# Patient Record
Sex: Male | Born: 1975 | Race: Black or African American | Hispanic: No | Marital: Single | Smoking: Current every day smoker
Health system: Southern US, Community
[De-identification: ages and names within clinical notes are randomized; demographics above are authoritative.]

## PROBLEM LIST (undated history)

## (undated) DIAGNOSIS — Z21 Asymptomatic human immunodeficiency virus [HIV] infection status: Secondary | ICD-10-CM

## (undated) DIAGNOSIS — F609 Personality disorder, unspecified: Secondary | ICD-10-CM

## (undated) DIAGNOSIS — F411 Generalized anxiety disorder: Secondary | ICD-10-CM

## (undated) DIAGNOSIS — F32A Depression, unspecified: Secondary | ICD-10-CM

## (undated) DIAGNOSIS — F39 Unspecified mood [affective] disorder: Secondary | ICD-10-CM

## (undated) DIAGNOSIS — F250 Schizoaffective disorder, bipolar type: Secondary | ICD-10-CM

## (undated) DIAGNOSIS — F329 Major depressive disorder, single episode, unspecified: Secondary | ICD-10-CM

## (undated) DIAGNOSIS — F200 Paranoid schizophrenia: Secondary | ICD-10-CM

## (undated) DIAGNOSIS — E119 Type 2 diabetes mellitus without complications: Secondary | ICD-10-CM

## (undated) DIAGNOSIS — F410 Panic disorder [episodic paroxysmal anxiety] without agoraphobia: Secondary | ICD-10-CM

## (undated) DIAGNOSIS — I10 Essential (primary) hypertension: Secondary | ICD-10-CM

## (undated) DIAGNOSIS — B20 Human immunodeficiency virus [HIV] disease: Secondary | ICD-10-CM

## (undated) HISTORY — DX: Panic disorder (episodic paroxysmal anxiety): F41.0

## (undated) HISTORY — DX: Essential (primary) hypertension: I10

## (undated) HISTORY — DX: Asymptomatic human immunodeficiency virus (hiv) infection status: Z21

## (undated) HISTORY — PX: SKIN GRAFT: SHX250

## (undated) HISTORY — DX: Human immunodeficiency virus (HIV) disease: B20

---

## 2007-10-09 ENCOUNTER — Emergency Department (HOSPITAL_COMMUNITY): Admission: EM | Admit: 2007-10-09 | Discharge: 2007-10-09 | Payer: Self-pay | Admitting: Emergency Medicine

## 2007-10-15 ENCOUNTER — Emergency Department (HOSPITAL_COMMUNITY): Admission: EM | Admit: 2007-10-15 | Discharge: 2007-10-15 | Payer: Self-pay | Admitting: Emergency Medicine

## 2007-10-18 ENCOUNTER — Emergency Department (HOSPITAL_COMMUNITY): Admission: EM | Admit: 2007-10-18 | Discharge: 2007-10-18 | Payer: Self-pay | Admitting: Emergency Medicine

## 2008-06-02 ENCOUNTER — Emergency Department (HOSPITAL_BASED_OUTPATIENT_CLINIC_OR_DEPARTMENT_OTHER): Admission: EM | Admit: 2008-06-02 | Discharge: 2008-06-02 | Payer: Self-pay | Admitting: Emergency Medicine

## 2008-06-02 ENCOUNTER — Ambulatory Visit: Payer: Self-pay | Admitting: Diagnostic Radiology

## 2008-06-02 IMAGING — CR DG CHEST 2V
2 series · 2 of 2 positions shown · non-contrast
Comparison: None

CLINICAL DATA: Chest tightness, short of breath.

CHEST - 2 VIEW

[w chest pa]
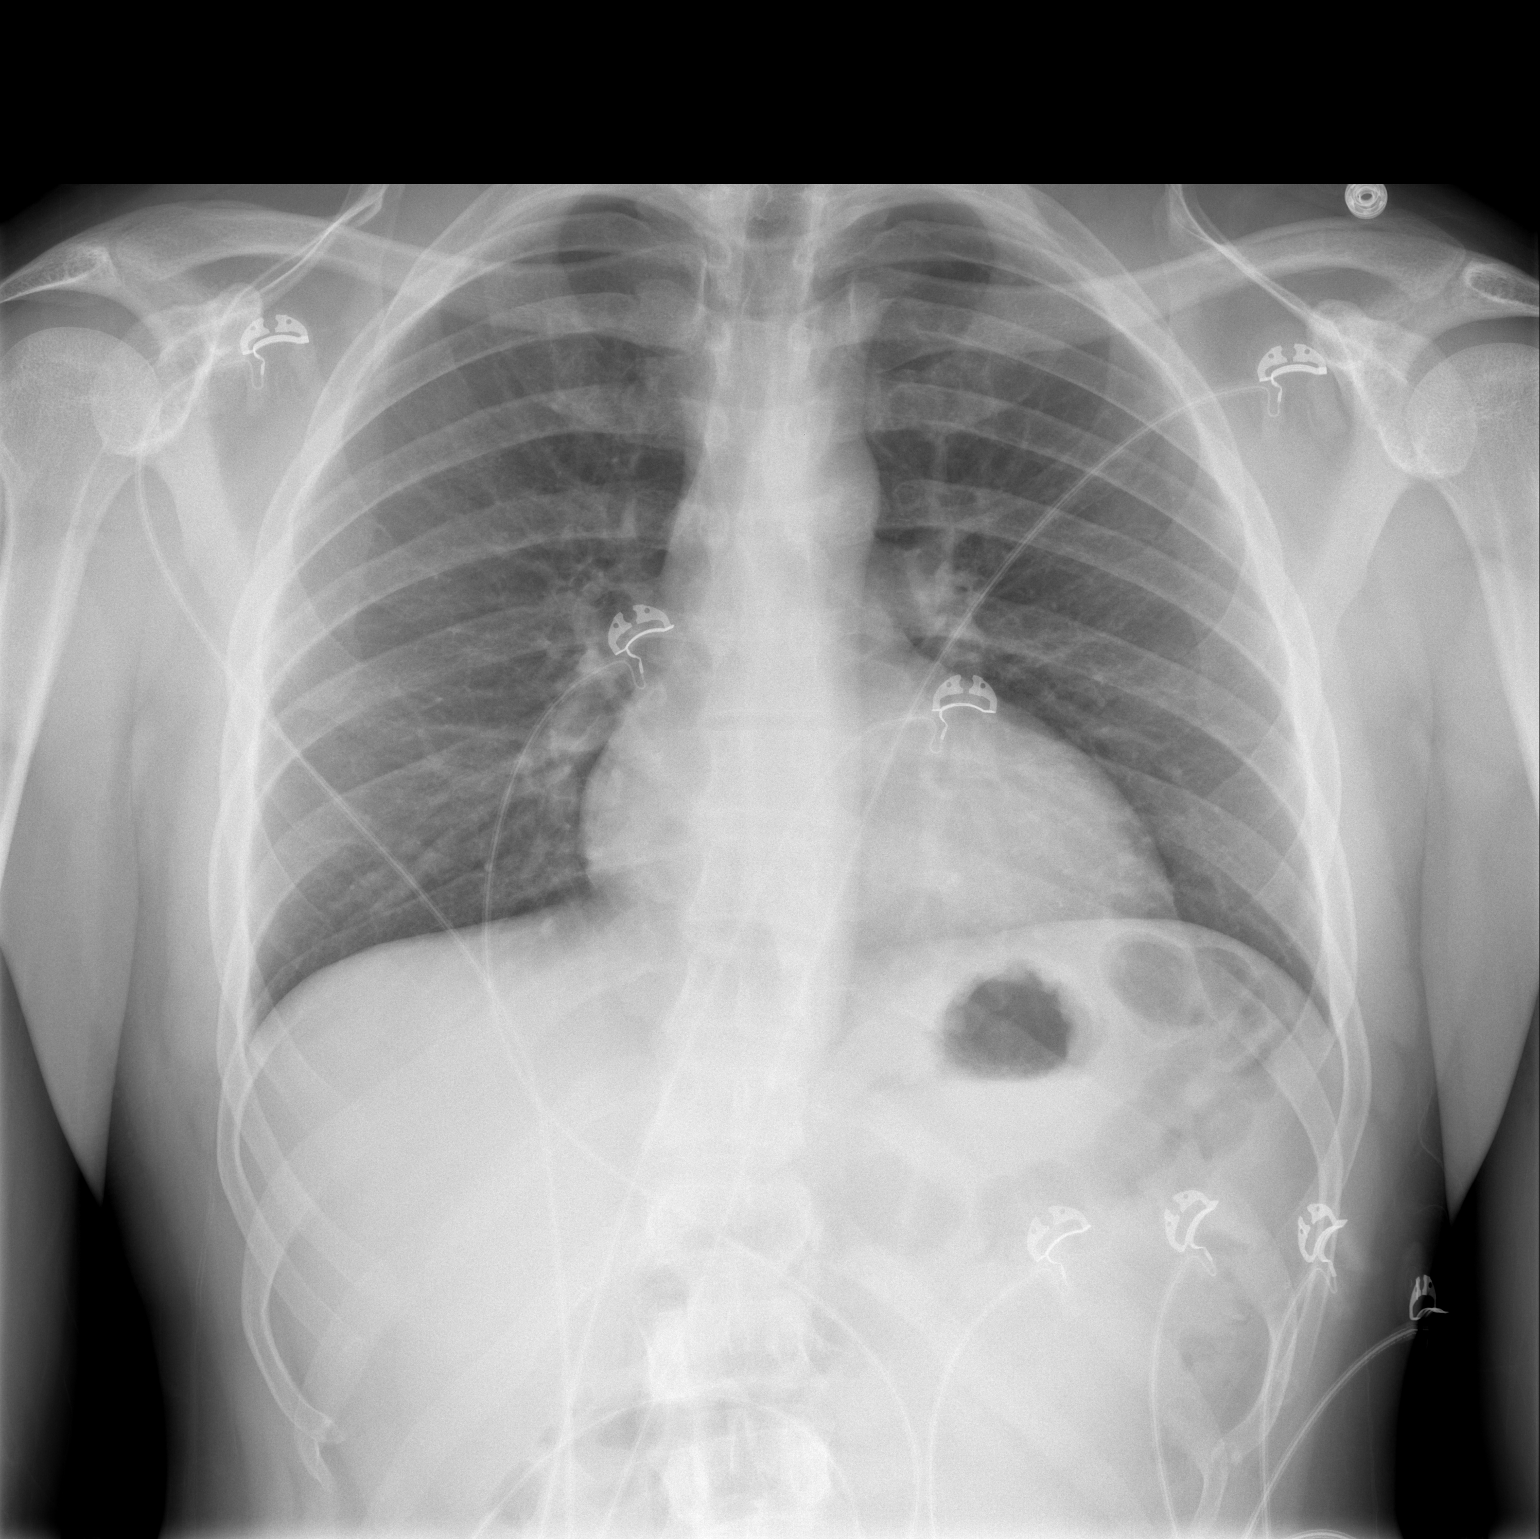

[w chest lat]
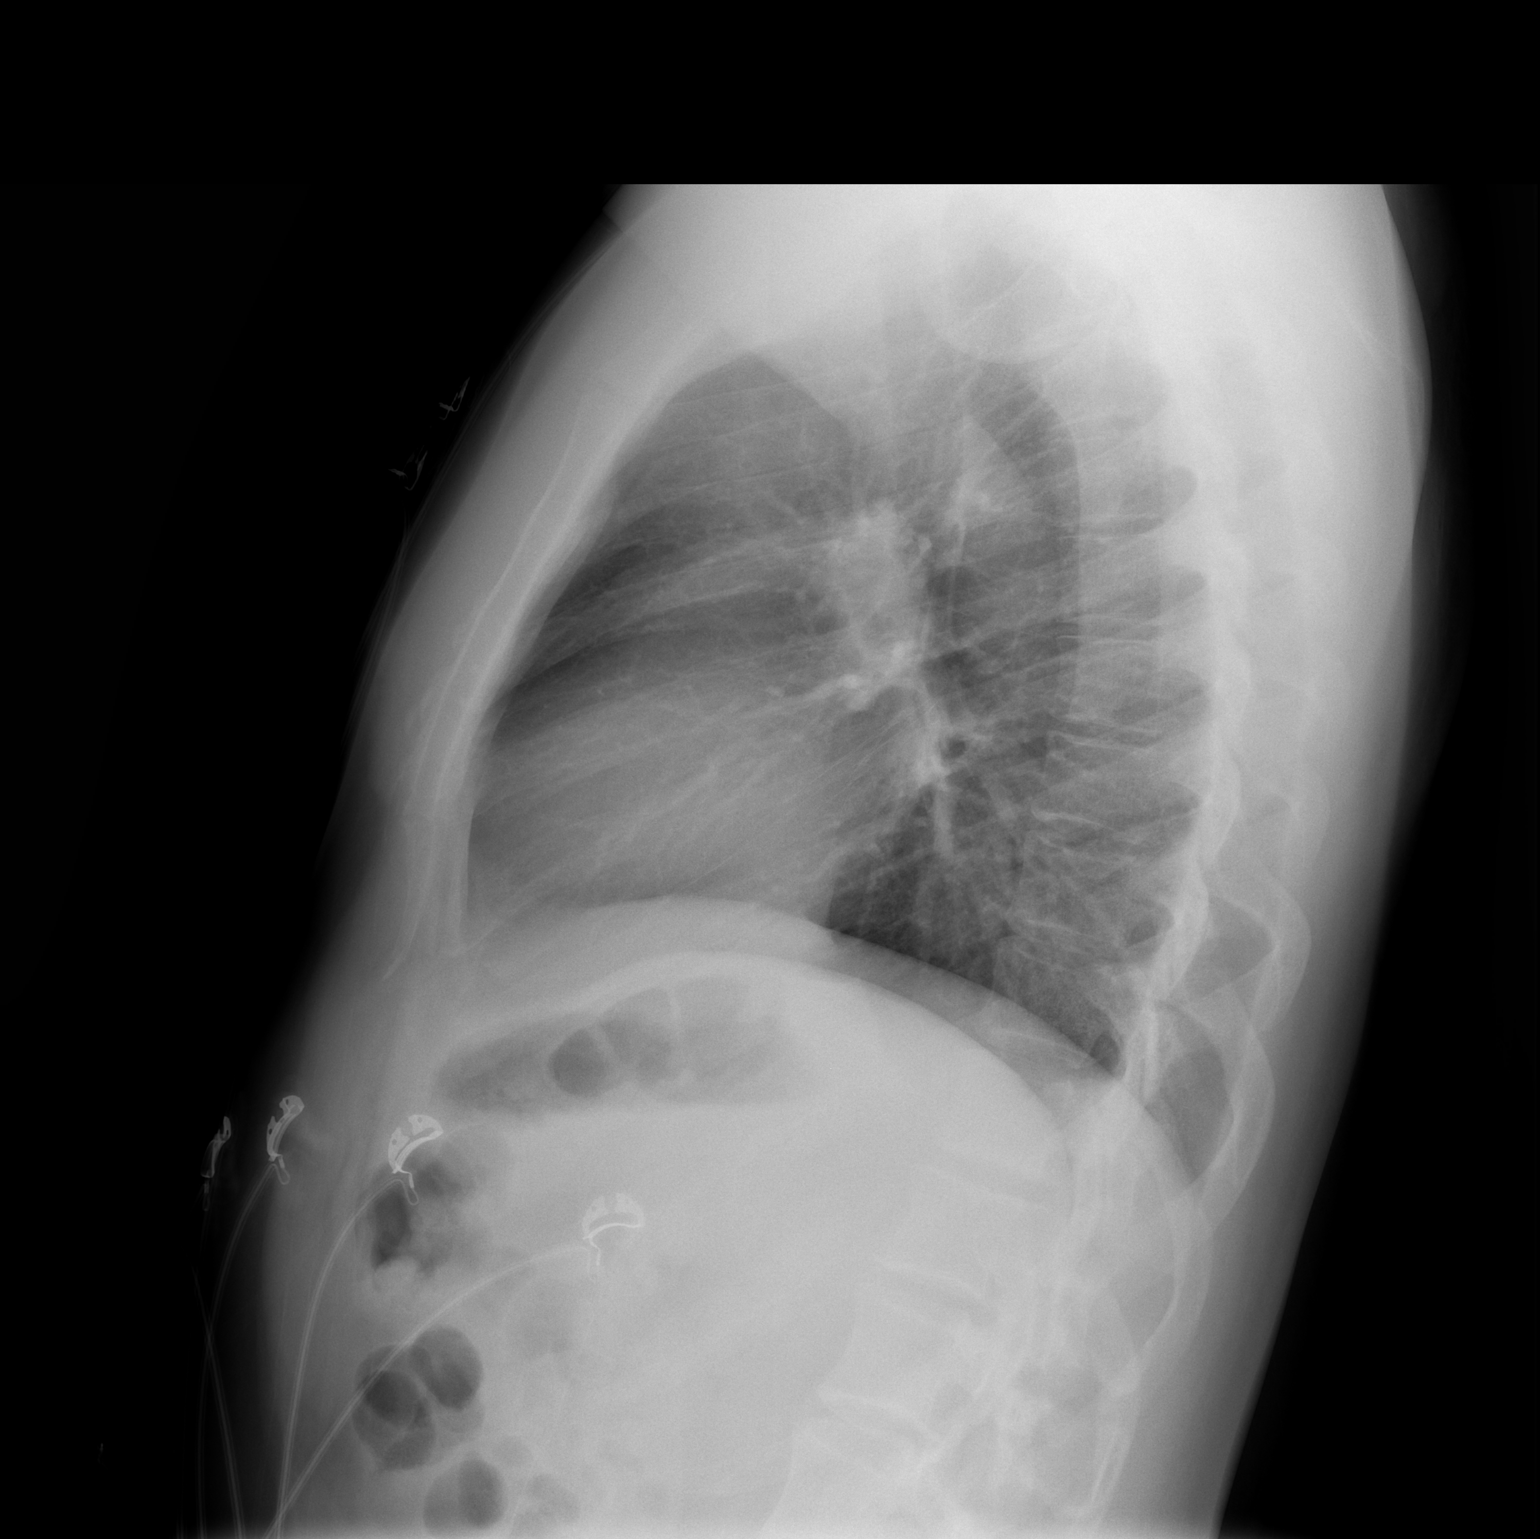

[2 of 2 positions shown; findings below may reference images not displayed]

FINDINGS: Heart size is upper normal.  There is no heart failure
and the lungs are clear without infiltrate or effusion.
IMPRESSION: No active cardiopulmonary disease.

## 2008-09-09 ENCOUNTER — Ambulatory Visit: Payer: Self-pay | Admitting: Interventional Radiology

## 2008-09-09 ENCOUNTER — Emergency Department (HOSPITAL_BASED_OUTPATIENT_CLINIC_OR_DEPARTMENT_OTHER): Admission: EM | Admit: 2008-09-09 | Discharge: 2008-09-09 | Payer: Self-pay | Admitting: Emergency Medicine

## 2008-09-09 IMAGING — CT CT HEAD W/O CM
1 series · 16 of 30 positions shown, 20 images · non-contrast
Comparison: None

CLINICAL DATA: Syncope

CT HEAD WITHOUT CONTRAST
TECHNIQUE: Contiguous axial images were obtained from the base of
the skull through the vertex without contrast.

[Series 2: head 4.8 h37s · axial · 0.46mm/px · z∈[-124,+28]mm · 16 of 36 slices shown, 20 images]
[im 2/36  brain]
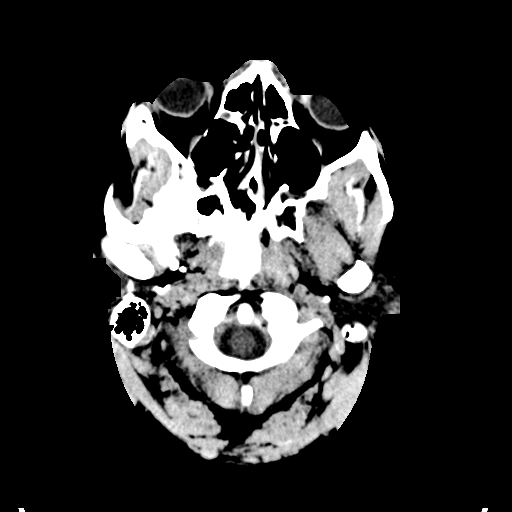
[im 2/36  bone]
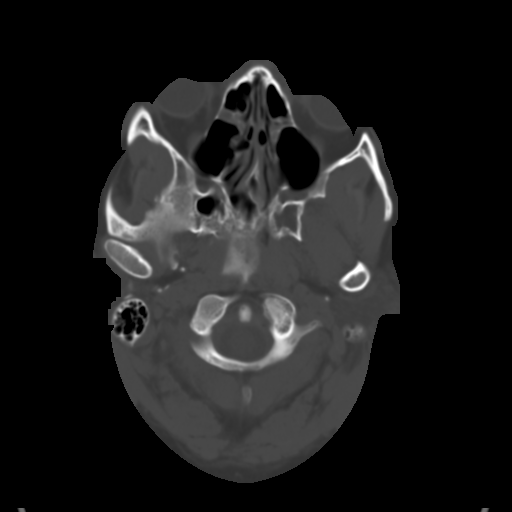
[im 4/36  brain]
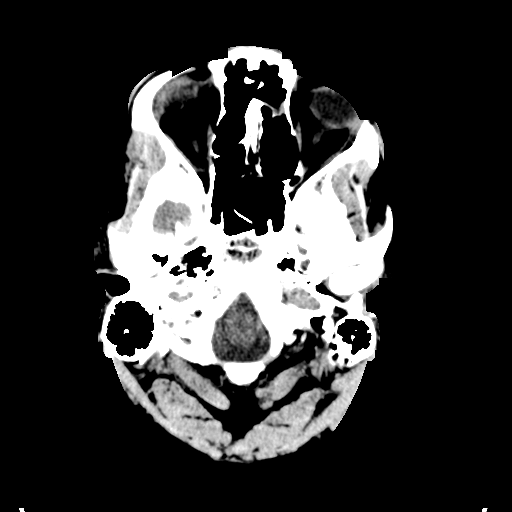
[im 7/36  brain]
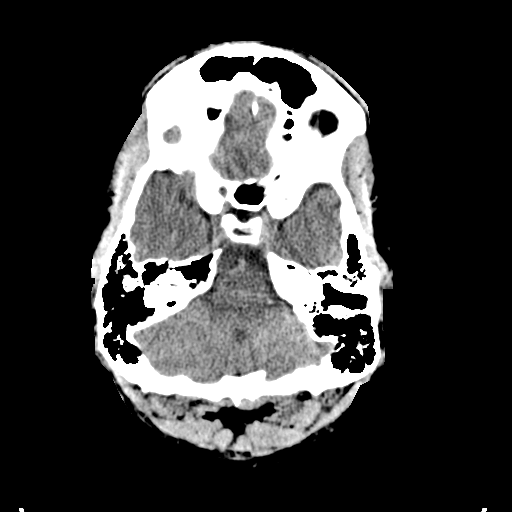
[im 9/36  brain]
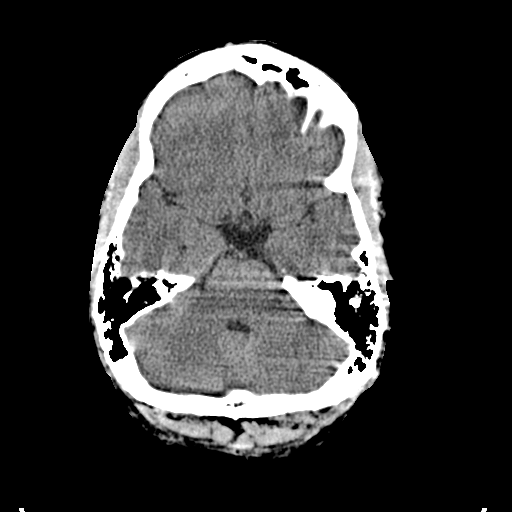
[im 10/36  brain]
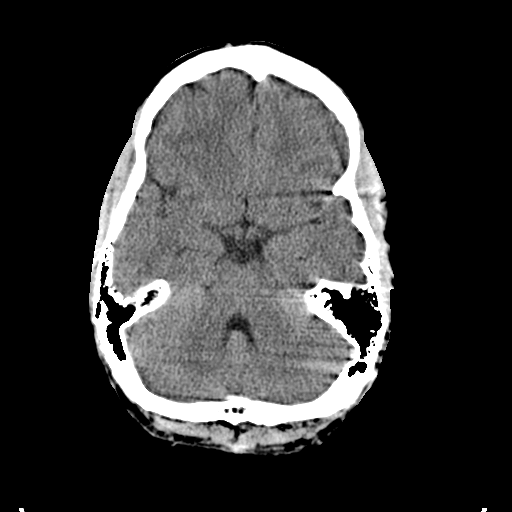
[im 10/36  bone]
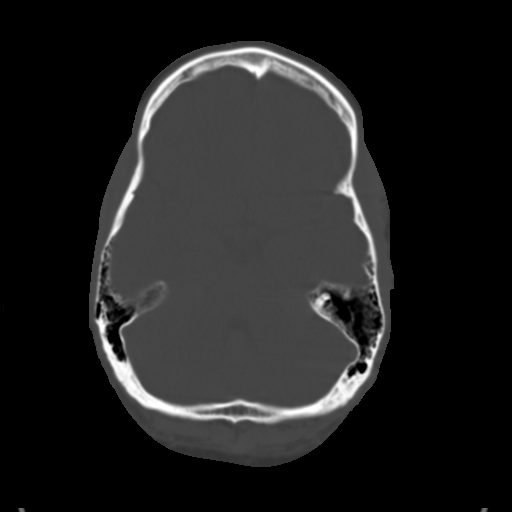
[im 13/36  brain]
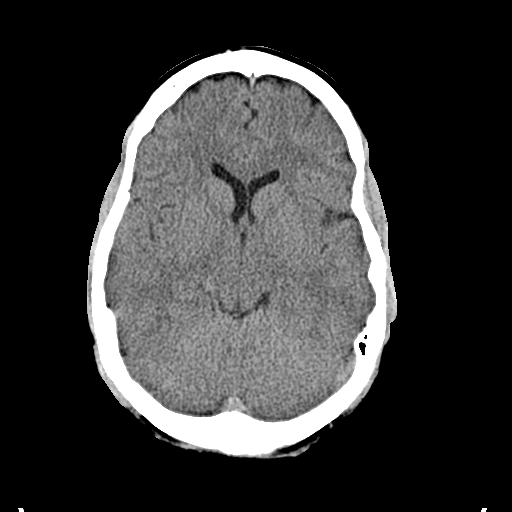
[im 15/36  brain]
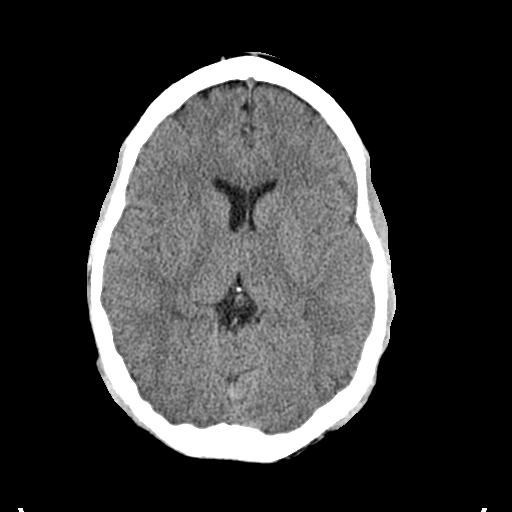
[im 17/36  brain]
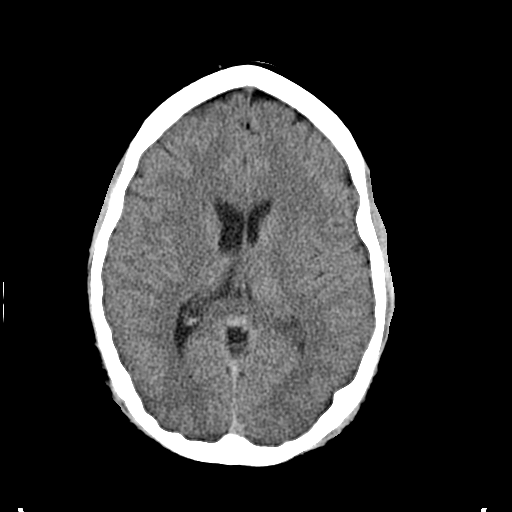
[im 19/36  brain]
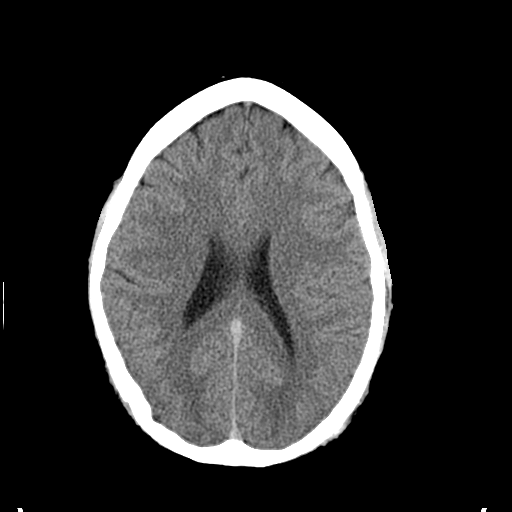
[im 19/36  bone]
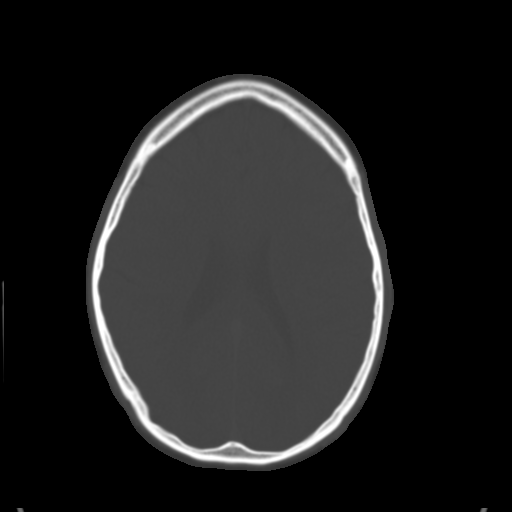
[im 21/36  brain]
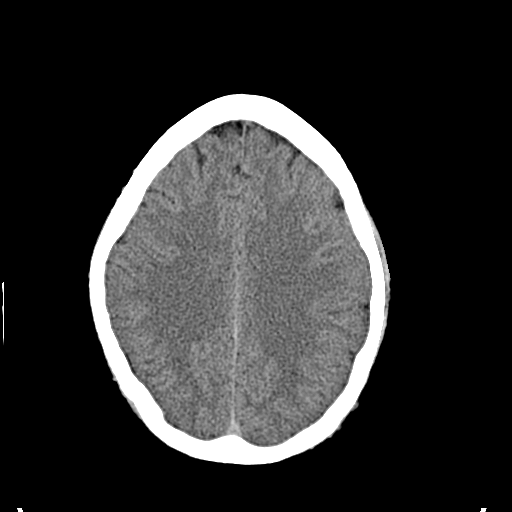
[im 23/36  brain]
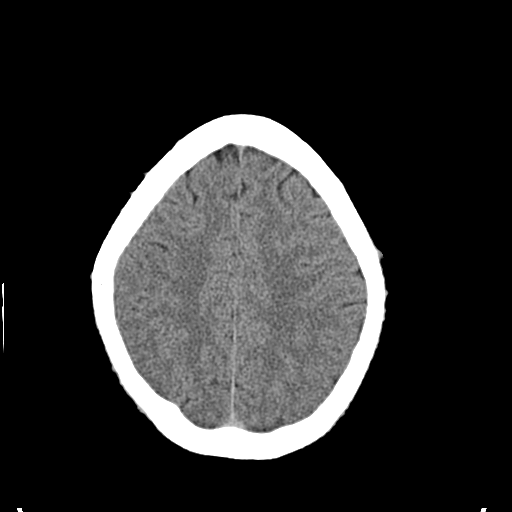
[im 26/36  brain]
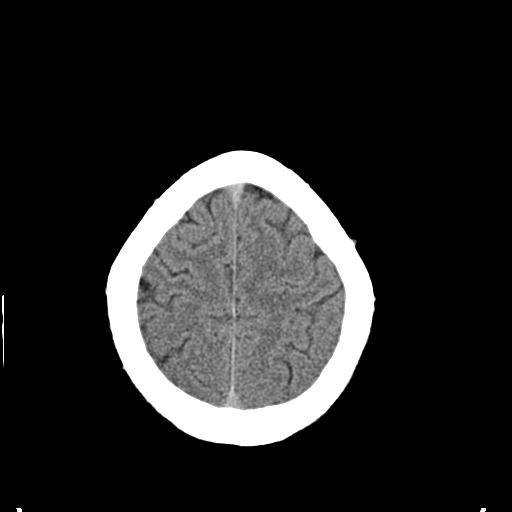
[im 27/36  brain]
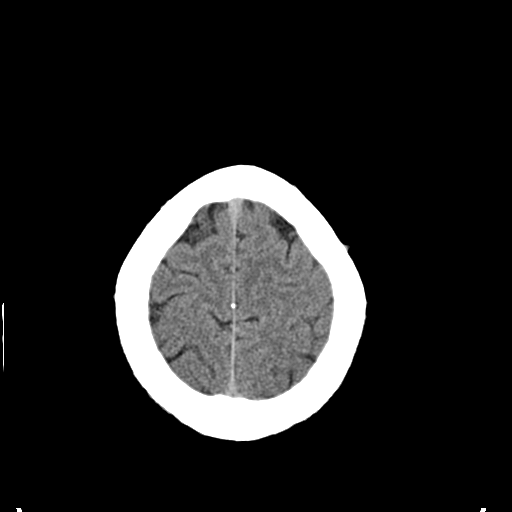
[im 27/36  bone]
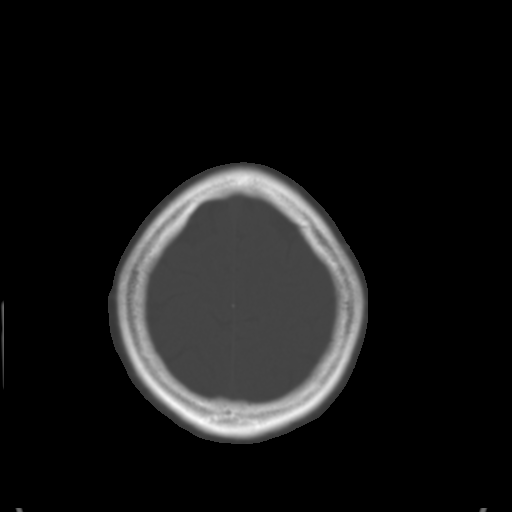
[im 29/36  brain]
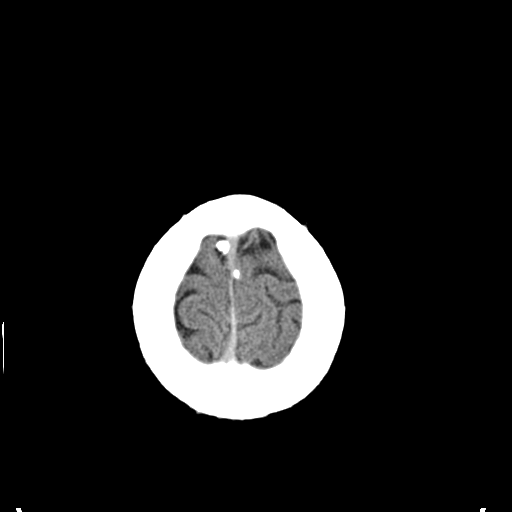
[im 32/36  brain]
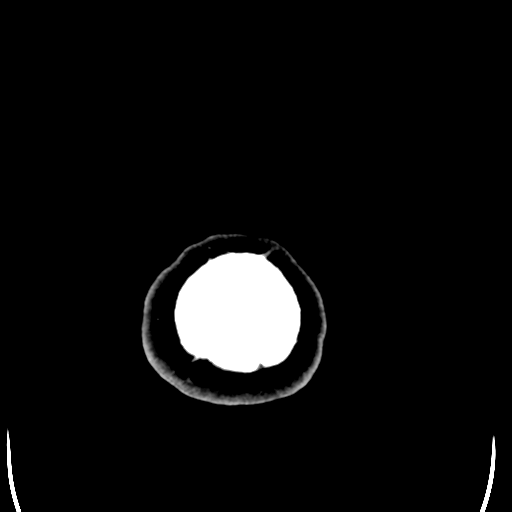
[im 34/36  brain]
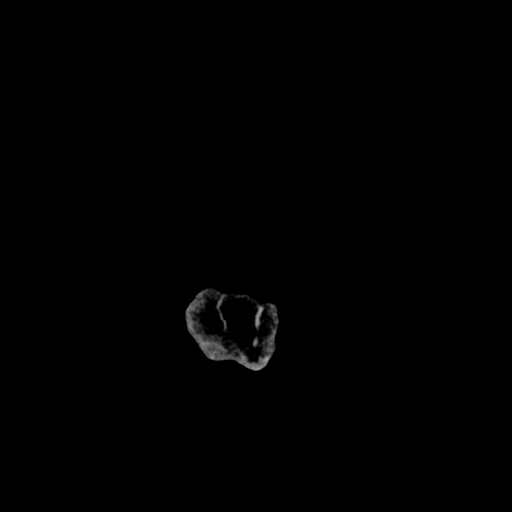

[16 of 30 positions shown; findings below may reference images not displayed]

FINDINGS: There is no mass effect, midline shift, or acute
hemorrhage.  Mastoid air cells are clear.  Cranium is intact.
Partial opacification of the ethmoid air cells is present
bilaterally.
IMPRESSION: Inflammatory changes in the ethmoid air cells are present.
Otherwise no acute intracranial pathology.

## 2008-09-10 ENCOUNTER — Inpatient Hospital Stay (HOSPITAL_COMMUNITY): Admission: EM | Admit: 2008-09-10 | Discharge: 2008-09-16 | Payer: Self-pay | Admitting: Psychiatry

## 2008-09-10 ENCOUNTER — Ambulatory Visit: Payer: Self-pay | Admitting: Psychiatry

## 2008-09-23 ENCOUNTER — Emergency Department (HOSPITAL_COMMUNITY): Admission: EM | Admit: 2008-09-23 | Discharge: 2008-09-23 | Payer: Self-pay | Admitting: Emergency Medicine

## 2008-09-24 ENCOUNTER — Inpatient Hospital Stay (HOSPITAL_COMMUNITY): Admission: AD | Admit: 2008-09-24 | Discharge: 2008-10-04 | Payer: Self-pay | Admitting: *Deleted

## 2008-09-24 ENCOUNTER — Emergency Department (HOSPITAL_BASED_OUTPATIENT_CLINIC_OR_DEPARTMENT_OTHER): Admission: EM | Admit: 2008-09-24 | Discharge: 2008-09-24 | Payer: Self-pay | Admitting: Emergency Medicine

## 2008-10-05 ENCOUNTER — Emergency Department (HOSPITAL_BASED_OUTPATIENT_CLINIC_OR_DEPARTMENT_OTHER): Admission: EM | Admit: 2008-10-05 | Discharge: 2008-10-05 | Payer: Self-pay | Admitting: Emergency Medicine

## 2008-10-05 ENCOUNTER — Ambulatory Visit: Payer: Self-pay | Admitting: Radiology

## 2008-10-05 IMAGING — CT CT HEAD W/O CM
1 series · 16 of 30 positions shown, 20 images · non-contrast
Comparison: [DATE] CT

CLINICAL DATA: Headache

CT HEAD WITHOUT CONTRAST
TECHNIQUE: Contiguous axial images were obtained from the base of
the skull through the vertex without contrast

[Series 2: head 4.8 h37s · axial · 0.47mm/px · z∈[-143,+13]mm · 16 of 36 slices shown, 20 images]
[im 2/36  brain]
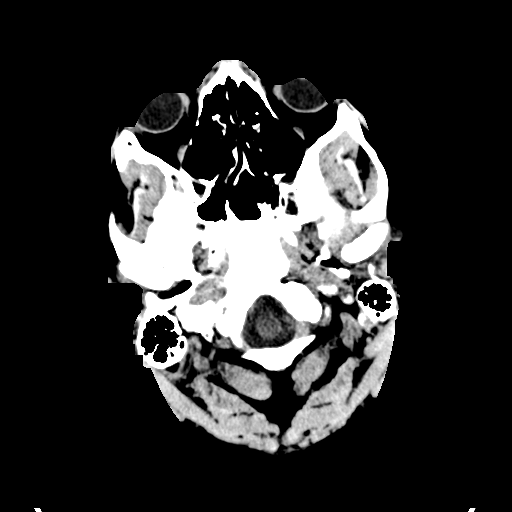
[im 2/36  bone]
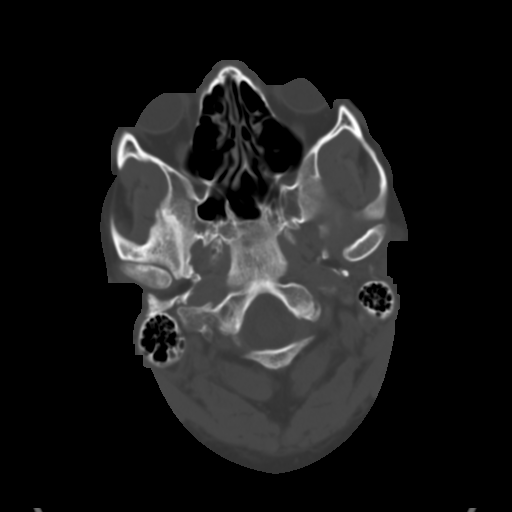
[im 4/36  brain]
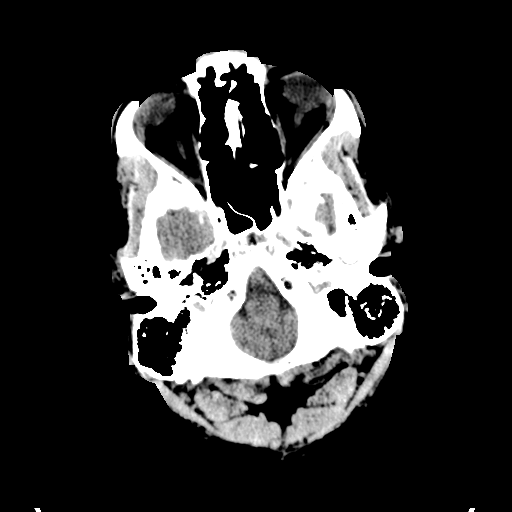
[im 7/36  brain]
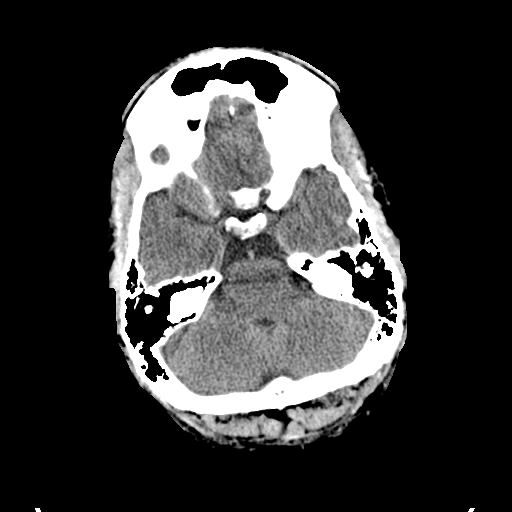
[im 9/36  brain]
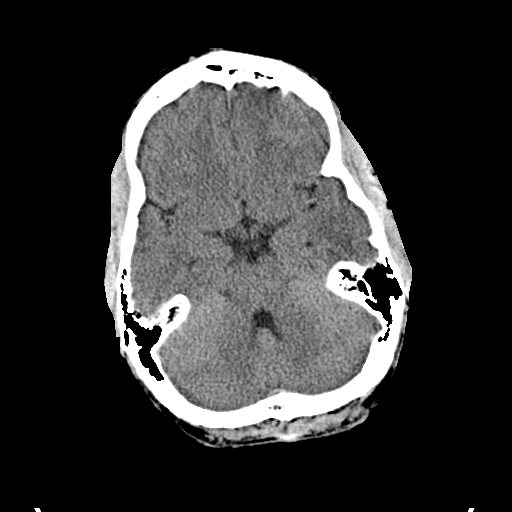
[im 10/36  brain]
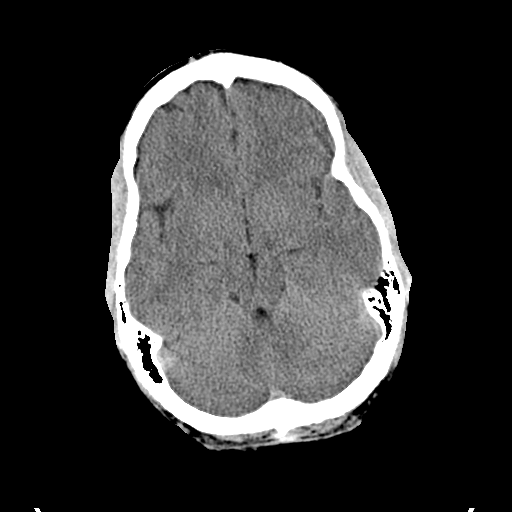
[im 10/36  bone]
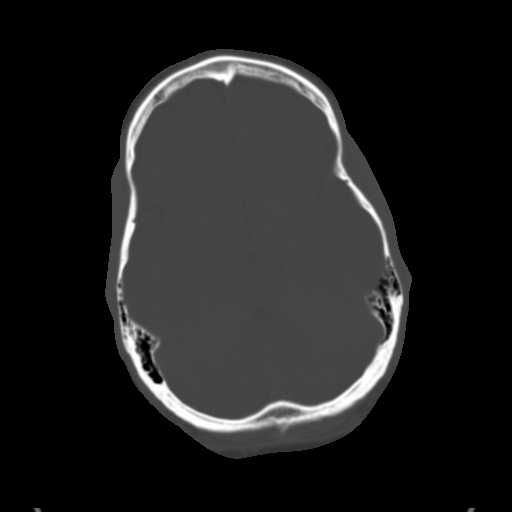
[im 13/36  brain]
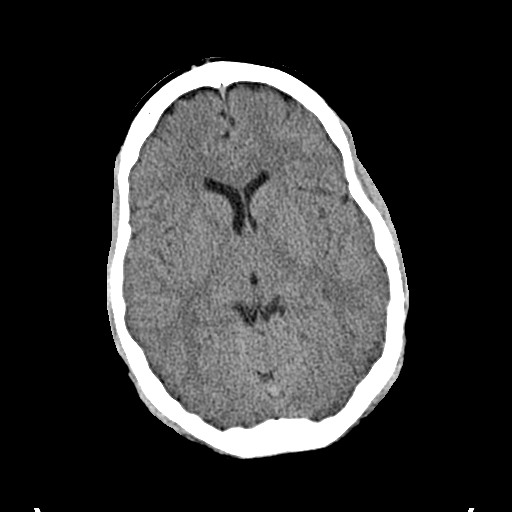
[im 15/36  brain]
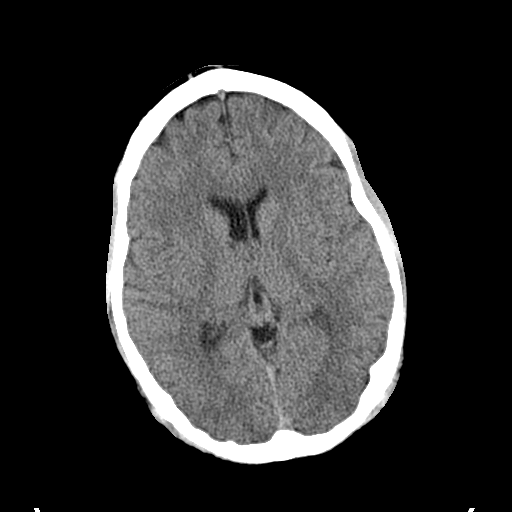
[im 17/36  brain]
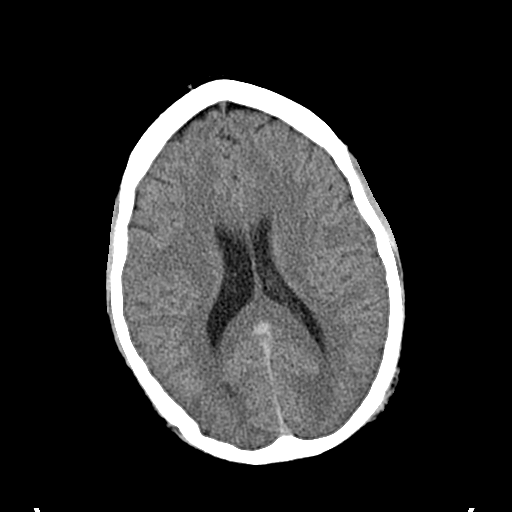
[im 19/36  brain]
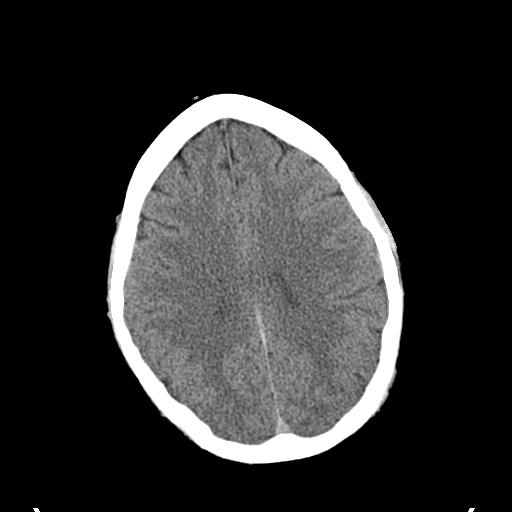
[im 19/36  bone]
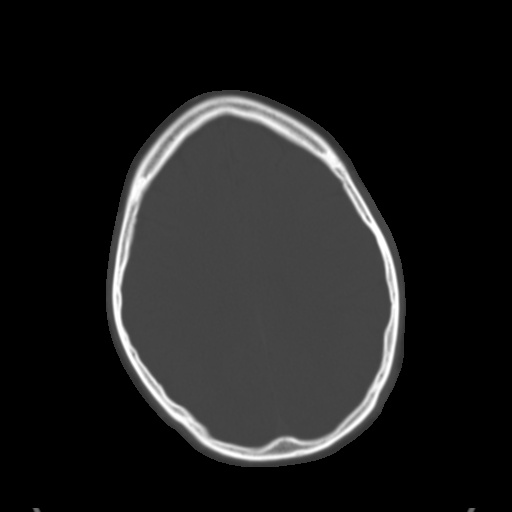
[im 21/36  brain]
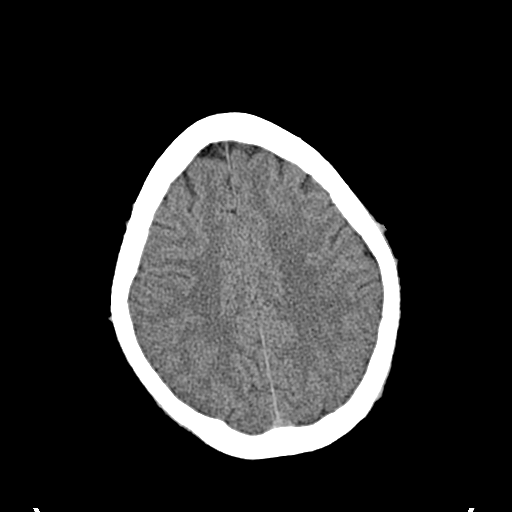
[im 23/36  brain]
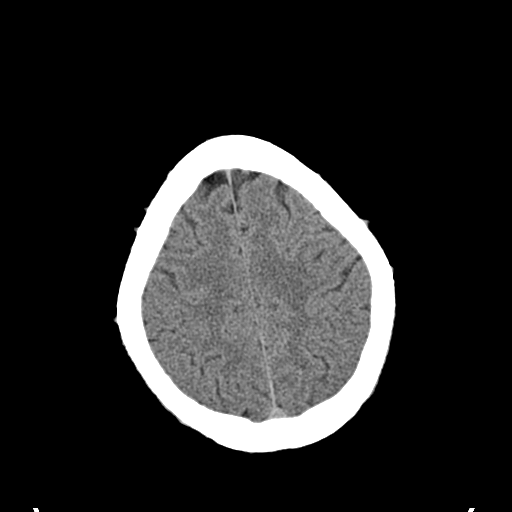
[im 26/36  brain]
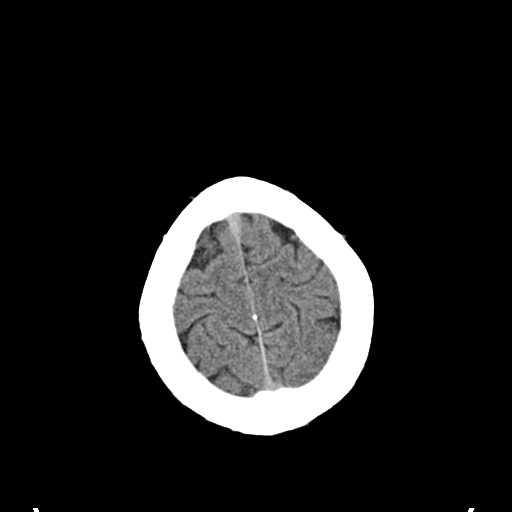
[im 27/36  brain]
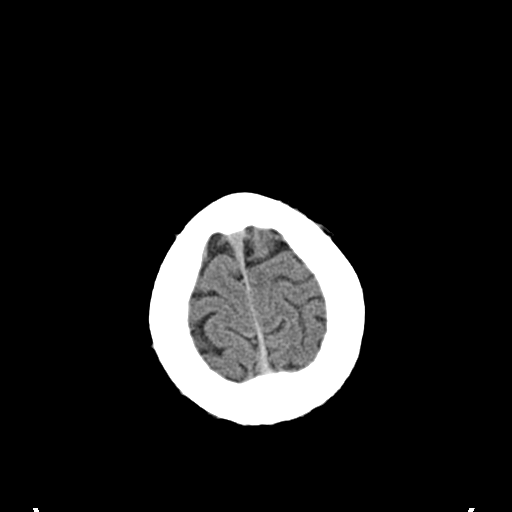
[im 27/36  bone]
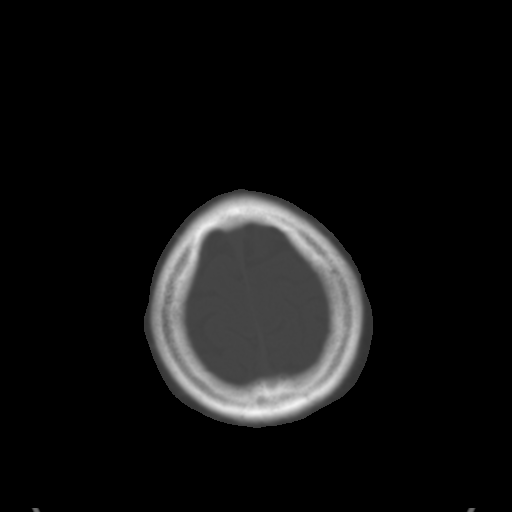
[im 29/36  brain]
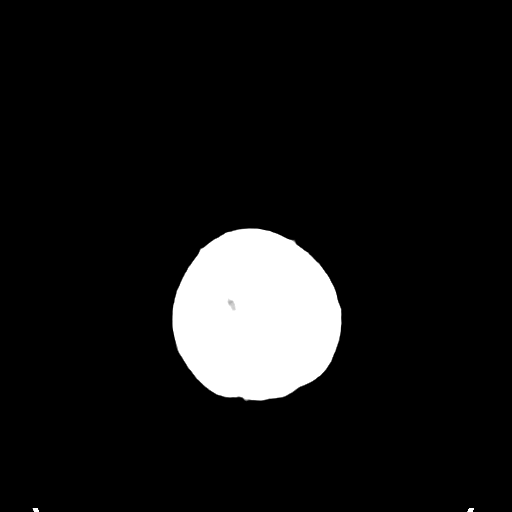
[im 32/36  brain]
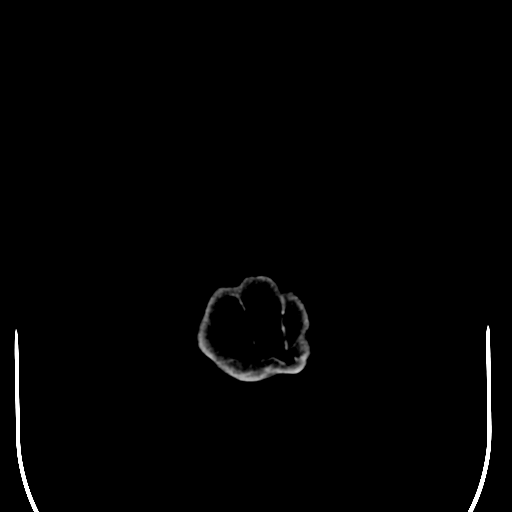
[im 34/36  brain]
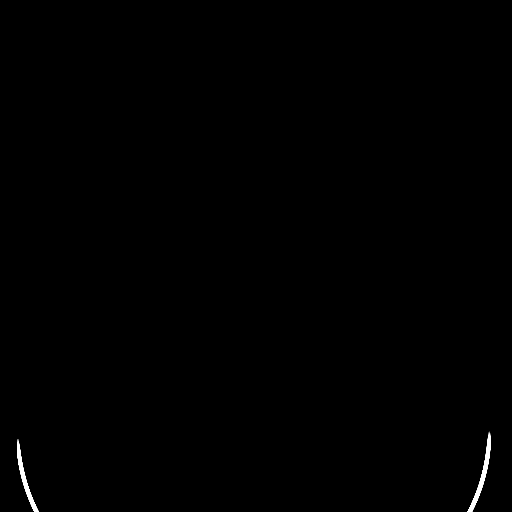

[16 of 30 positions shown; findings below may reference images not displayed]

FINDINGS: The brain has a normal appearance without evidence for
hemorrhage, acute infarction, hydrocephalus, or mass lesion.  There
is no extra axial fluid collection.  The skull and paranasal
sinuses are normal.
IMPRESSION: Normal CT of the head without contrast.

## 2008-10-07 ENCOUNTER — Emergency Department (HOSPITAL_COMMUNITY): Admission: EM | Admit: 2008-10-07 | Discharge: 2008-10-07 | Payer: Self-pay | Admitting: Emergency Medicine

## 2008-12-18 ENCOUNTER — Emergency Department (HOSPITAL_COMMUNITY): Admission: EM | Admit: 2008-12-18 | Discharge: 2008-12-19 | Payer: Self-pay | Admitting: Emergency Medicine

## 2008-12-19 ENCOUNTER — Emergency Department (HOSPITAL_COMMUNITY): Admission: EM | Admit: 2008-12-19 | Discharge: 2008-12-20 | Payer: Self-pay | Admitting: Emergency Medicine

## 2008-12-22 ENCOUNTER — Emergency Department (HOSPITAL_COMMUNITY): Admission: EM | Admit: 2008-12-22 | Discharge: 2008-12-22 | Payer: Self-pay | Admitting: Emergency Medicine

## 2008-12-23 ENCOUNTER — Ambulatory Visit: Payer: Self-pay | Admitting: Psychiatry

## 2008-12-23 ENCOUNTER — Inpatient Hospital Stay (HOSPITAL_COMMUNITY): Admission: RE | Admit: 2008-12-23 | Discharge: 2008-12-29 | Payer: Self-pay | Admitting: Psychiatry

## 2008-12-27 IMAGING — CR DG KNEE COMPLETE 4+V*R*
4 series · 4 of 4 positions shown · non-contrast
Comparison: None

CLINICAL DATA: Fall.  Knee pain swelling.  Decreased range of
motion.

RIGHT KNEE - COMPLETE 4+ VIEW

[t knee ap right]
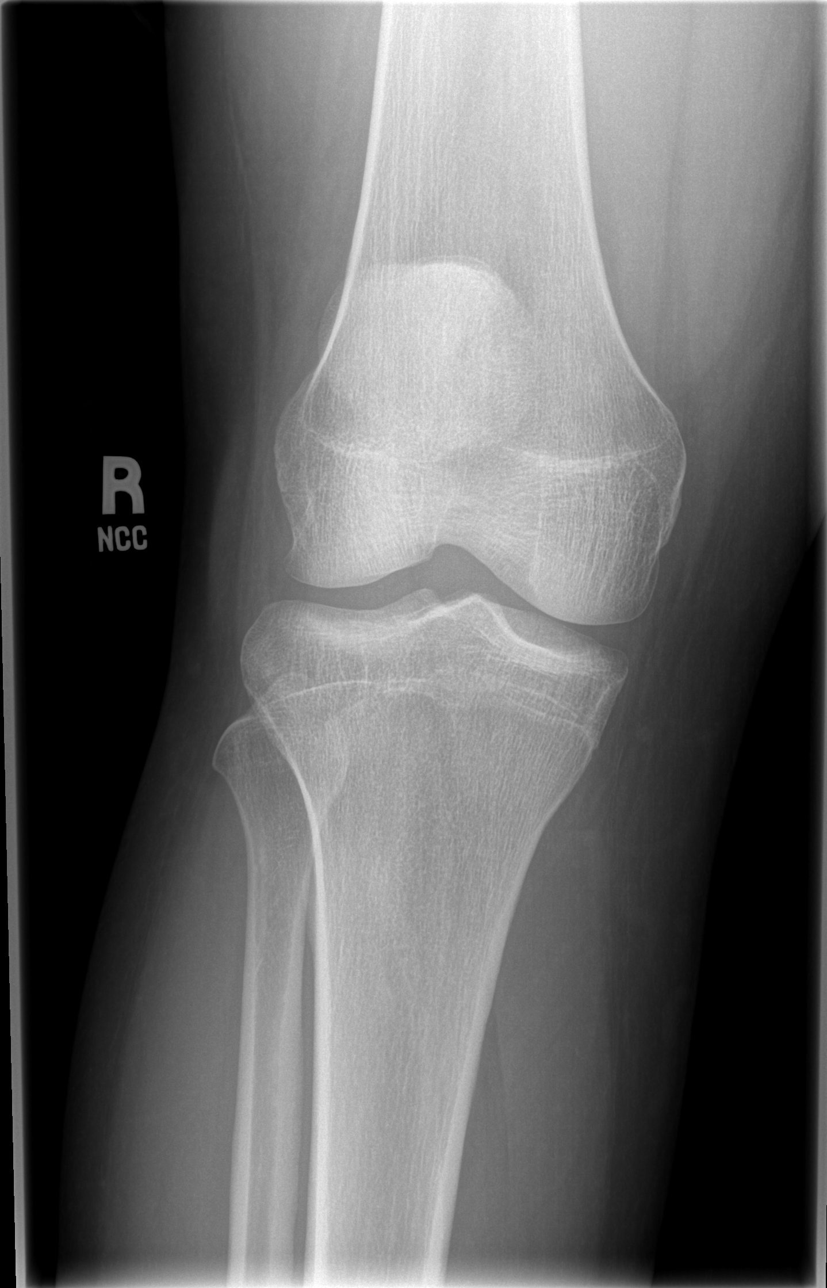

[t knee oblique right (1 of 2)]
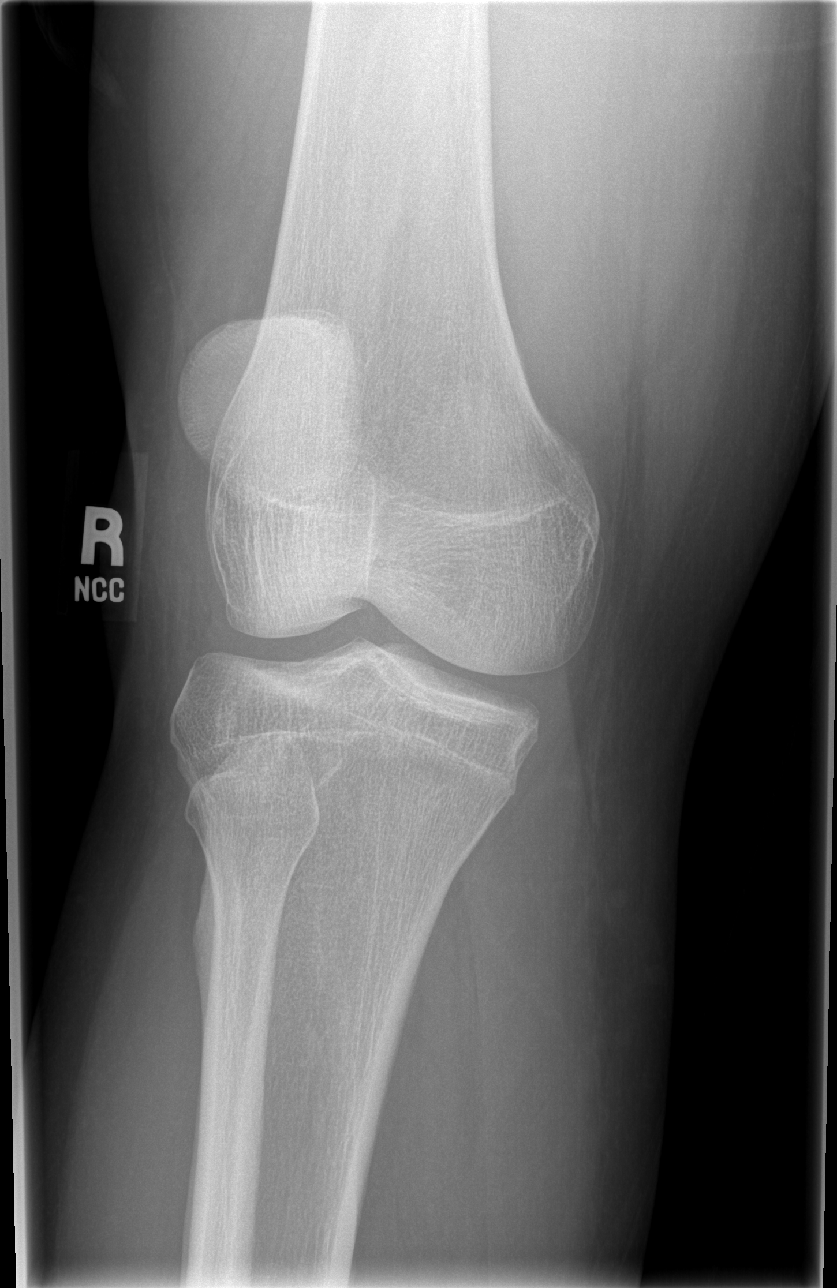

[t knee oblique right (2 of 2)]
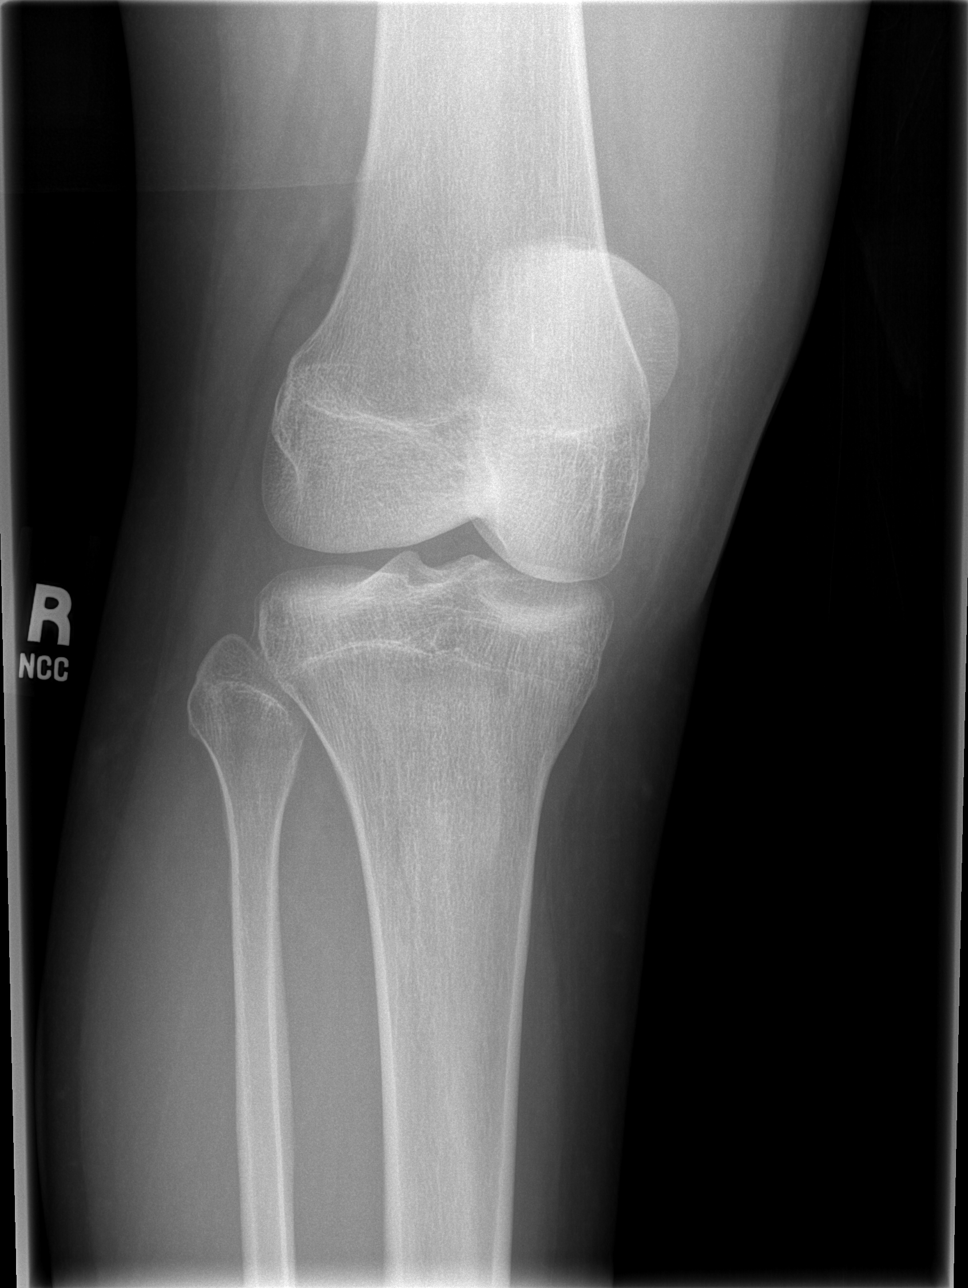

[t knee lat right]
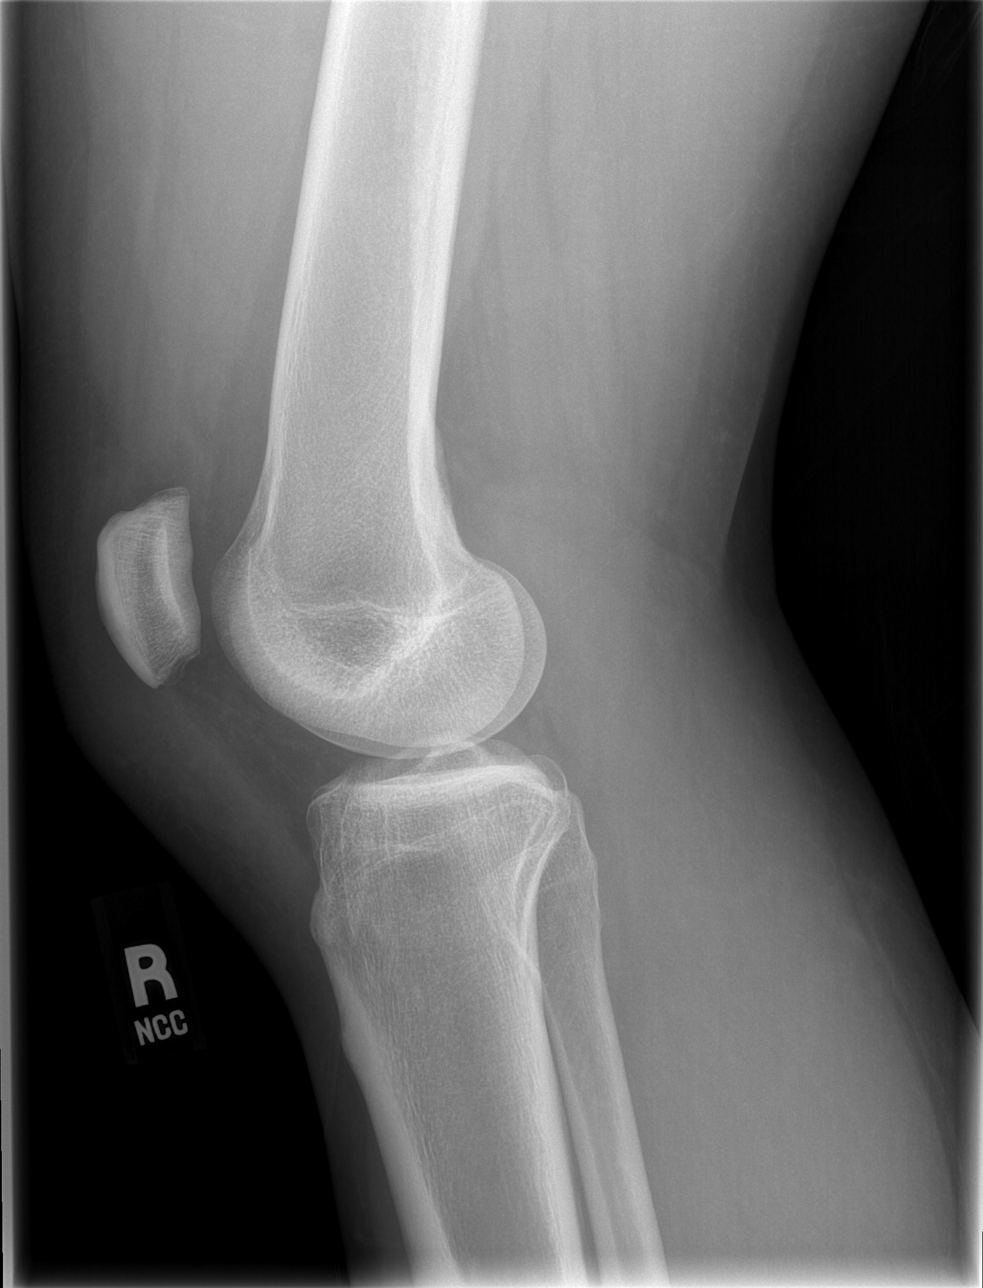

[4 of 4 positions shown; findings below may reference images not displayed]

FINDINGS: There is no evidence of fracture, dislocation, or joint
effusion.  There is no evidence of arthropathy or other focal bone
abnormality.  Soft tissues are unremarkable.
IMPRESSION: Negative.

## 2009-01-03 ENCOUNTER — Emergency Department (HOSPITAL_COMMUNITY): Admission: EM | Admit: 2009-01-03 | Discharge: 2009-01-03 | Payer: Self-pay | Admitting: Emergency Medicine

## 2009-01-06 ENCOUNTER — Emergency Department (HOSPITAL_COMMUNITY): Admission: EM | Admit: 2009-01-06 | Discharge: 2009-01-07 | Payer: Self-pay | Admitting: Emergency Medicine

## 2009-01-07 IMAGING — CR DG CHEST 2V
2 series · 2 of 2 positions shown · non-contrast
Comparison: Chest radiograph performed [DATE]

CLINICAL DATA: Chest pain and syncope.

CHEST - 2 VIEW

[w chest pa]
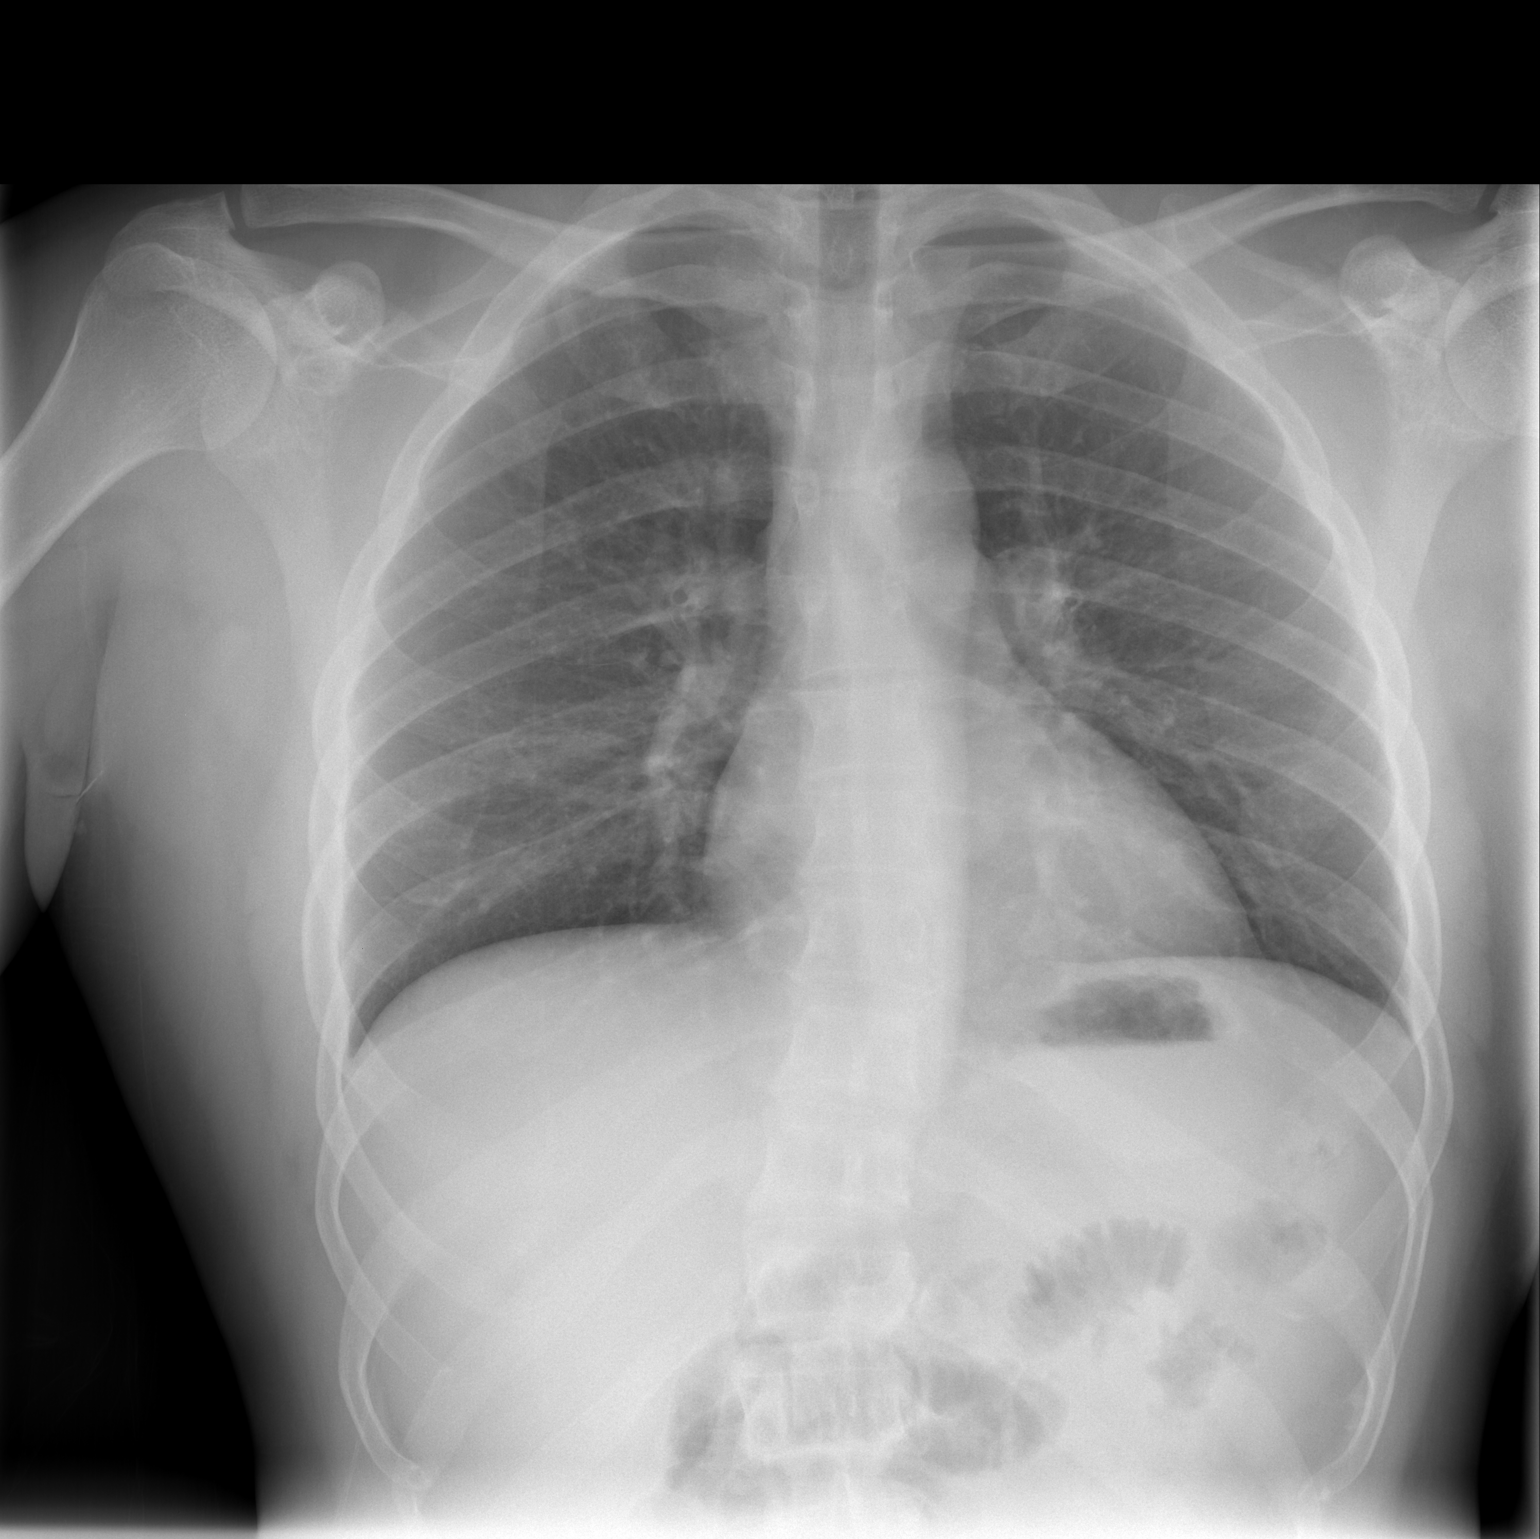

[w chest lat]
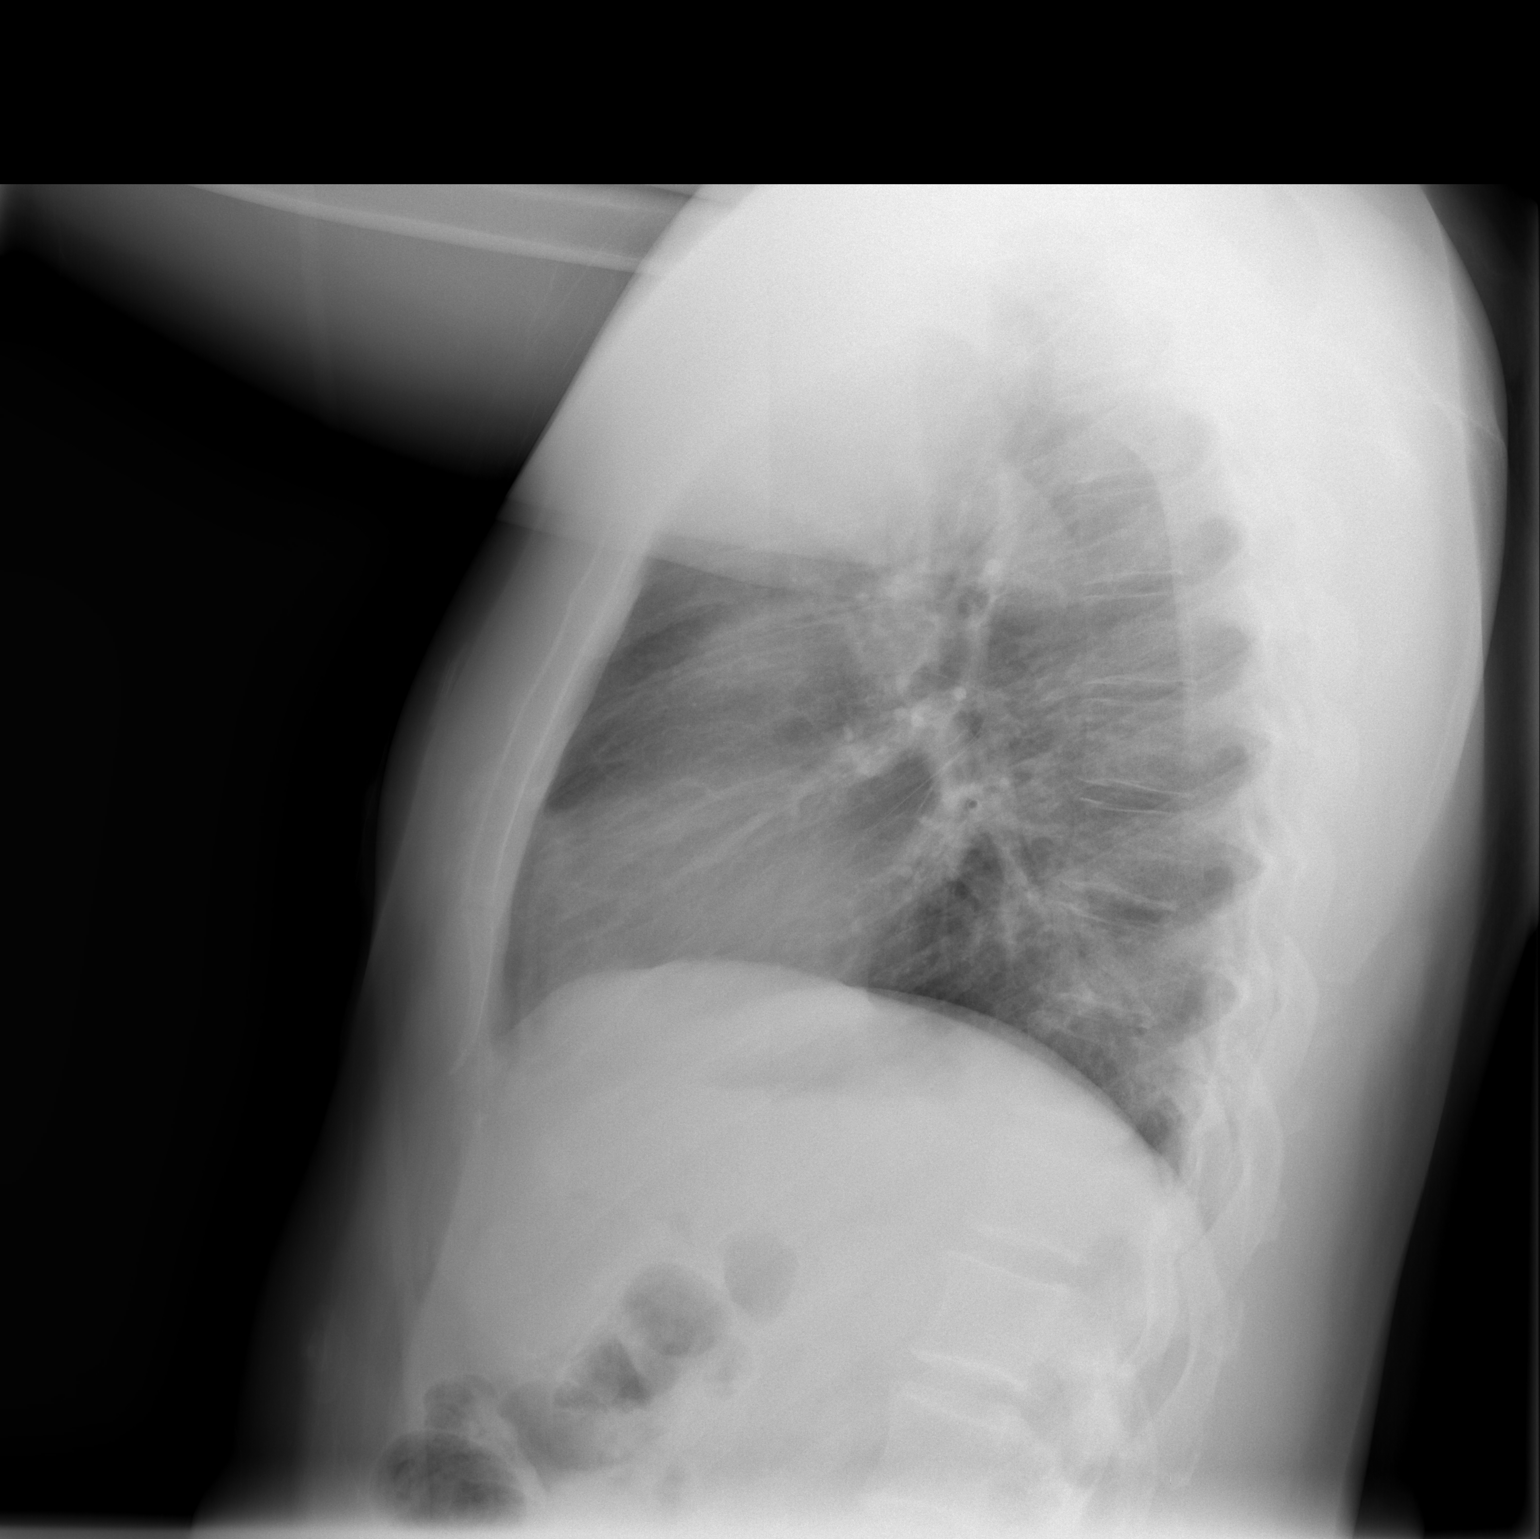

[2 of 2 positions shown; findings below may reference images not displayed]

FINDINGS: The lungs are slightly hypoexpanded but appear clear.
There is no evidence of focal opacification, pleural effusion or
pneumothorax.

The heart is normal in size; the mediastinal contour is within
normal limits.  No acute osseous abnormalities are seen. A few air
filled loops of small bowel are noted, within normal limits.
IMPRESSION: No acute cardiopulmonary process seen.

## 2009-02-26 ENCOUNTER — Emergency Department (HOSPITAL_COMMUNITY): Admission: EM | Admit: 2009-02-26 | Discharge: 2009-02-26 | Payer: Self-pay | Admitting: Emergency Medicine

## 2009-02-27 ENCOUNTER — Emergency Department (HOSPITAL_BASED_OUTPATIENT_CLINIC_OR_DEPARTMENT_OTHER): Admission: EM | Admit: 2009-02-27 | Discharge: 2009-02-27 | Payer: Self-pay | Admitting: Emergency Medicine

## 2009-03-03 ENCOUNTER — Emergency Department (HOSPITAL_COMMUNITY): Admission: EM | Admit: 2009-03-03 | Discharge: 2009-03-03 | Payer: Self-pay | Admitting: Emergency Medicine

## 2009-03-06 ENCOUNTER — Emergency Department (HOSPITAL_COMMUNITY): Admission: EM | Admit: 2009-03-06 | Discharge: 2009-03-07 | Payer: Self-pay | Admitting: Emergency Medicine

## 2009-03-07 ENCOUNTER — Inpatient Hospital Stay (HOSPITAL_COMMUNITY): Admission: EM | Admit: 2009-03-07 | Discharge: 2009-03-15 | Payer: Self-pay | Admitting: Psychiatry

## 2009-03-07 ENCOUNTER — Ambulatory Visit: Payer: Self-pay | Admitting: Psychiatry

## 2009-03-10 ENCOUNTER — Emergency Department (HOSPITAL_COMMUNITY): Admission: EM | Admit: 2009-03-10 | Discharge: 2009-03-10 | Payer: Self-pay | Admitting: Emergency Medicine

## 2009-03-16 ENCOUNTER — Emergency Department (HOSPITAL_COMMUNITY): Admission: EM | Admit: 2009-03-16 | Discharge: 2009-03-16 | Payer: Self-pay | Admitting: Emergency Medicine

## 2009-03-16 ENCOUNTER — Emergency Department (HOSPITAL_COMMUNITY): Admission: EM | Admit: 2009-03-16 | Discharge: 2009-03-17 | Payer: Self-pay | Admitting: Internal Medicine

## 2009-03-17 ENCOUNTER — Emergency Department (HOSPITAL_COMMUNITY): Admission: EM | Admit: 2009-03-17 | Discharge: 2009-03-26 | Payer: Self-pay | Admitting: Emergency Medicine

## 2009-03-26 ENCOUNTER — Ambulatory Visit: Payer: Self-pay | Admitting: Psychiatry

## 2009-09-19 ENCOUNTER — Emergency Department (HOSPITAL_COMMUNITY): Admission: EM | Admit: 2009-09-19 | Discharge: 2009-09-19 | Payer: Self-pay | Admitting: Emergency Medicine

## 2009-09-26 DIAGNOSIS — B20 Human immunodeficiency virus [HIV] disease: Secondary | ICD-10-CM | POA: Insufficient documentation

## 2009-09-27 ENCOUNTER — Ambulatory Visit: Payer: Self-pay | Admitting: Internal Medicine

## 2009-09-27 LAB — CONVERTED CEMR LAB
ALT: 20 units/L (ref 0–53)
AST: 18 units/L (ref 0–37)
Albumin: 4.4 g/dL (ref 3.5–5.2)
Alkaline Phosphatase: 110 units/L (ref 39–117)
BUN: 10 mg/dL (ref 6–23)
Basophils Absolute: 0 10*3/uL (ref 0.0–0.1)
Basophils Relative: 0 % (ref 0–1)
Bilirubin Urine: NEGATIVE
CO2: 27 meq/L (ref 19–32)
Calcium: 9.4 mg/dL (ref 8.4–10.5)
Chlamydia, Swab/Urine, PCR: NEGATIVE
Chloride: 105 meq/L (ref 96–112)
Cholesterol: 153 mg/dL (ref 0–200)
Creatinine, Ser: 1.11 mg/dL (ref 0.40–1.50)
Eosinophils Absolute: 0.1 10*3/uL (ref 0.0–0.7)
Eosinophils Relative: 2 % (ref 0–5)
GC Probe Amp, Urine: NEGATIVE
Glucose, Bld: 74 mg/dL (ref 70–99)
HCT: 44.9 % (ref 39.0–52.0)
HCV Ab: NEGATIVE
HDL: 27 mg/dL — ABNORMAL LOW (ref >39)
HIV 1 RNA Quant: 2550 copies/mL — ABNORMAL HIGH (ref <48)
HIV-1 RNA Quant, Log: 3.41 — ABNORMAL HIGH (ref <1.68)
HIV-1 antibody: POSITIVE — AB
HIV-2 Ab: NEGATIVE
HIV: REACTIVE
Hemoglobin, Urine: NEGATIVE
Hemoglobin: 15.4 g/dL (ref 13.0–17.0)
Hep A Total Ab: POSITIVE — AB
Hep B Core Total Ab: NEGATIVE
Hep B S Ab: POSITIVE — AB
Hepatitis B Surface Ag: NEGATIVE
Ketones, ur: NEGATIVE mg/dL
LDL Cholesterol: 99 mg/dL (ref 0–99)
Leukocytes, UA: NEGATIVE
Lymphocytes Relative: 55 % — ABNORMAL HIGH (ref 12–46)
Lymphs Abs: 3.4 10*3/uL (ref 0.7–4.0)
MCHC: 34.3 g/dL (ref 30.0–36.0)
MCV: 88.7 fL (ref 78.0–100.0)
Monocytes Absolute: 0.6 10*3/uL (ref 0.1–1.0)
Monocytes Relative: 9 % (ref 3–12)
Neutro Abs: 2.2 10*3/uL (ref 1.7–7.7)
Neutrophils Relative %: 34 % — ABNORMAL LOW (ref 43–77)
Nitrite: NEGATIVE
Platelets: 218 10*3/uL (ref 150–400)
Potassium: 4.3 meq/L (ref 3.5–5.3)
Protein, ur: NEGATIVE mg/dL
RBC: 5.06 M/uL (ref 4.22–5.81)
RDW: 13.4 % (ref 11.5–15.5)
Sodium: 141 meq/L (ref 135–145)
Specific Gravity, Urine: 1.024 (ref 1.005–1.030)
Total Bilirubin: 0.3 mg/dL (ref 0.3–1.2)
Total CHOL/HDL Ratio: 5.7
Total Protein: 7.6 g/dL (ref 6.0–8.3)
Triglycerides: 133 mg/dL (ref <150)
Urine Glucose: NEGATIVE mg/dL
Urobilinogen, UA: 0.2 (ref 0.0–1.0)
VLDL: 27 mg/dL (ref 0–40)
WBC: 6.3 10*3/uL (ref 4.0–10.5)
pH: 6 (ref 5.0–8.0)

## 2009-10-17 ENCOUNTER — Ambulatory Visit: Payer: Self-pay | Admitting: Internal Medicine

## 2009-10-17 DIAGNOSIS — F17200 Nicotine dependence, unspecified, uncomplicated: Secondary | ICD-10-CM | POA: Insufficient documentation

## 2009-10-17 DIAGNOSIS — L259 Unspecified contact dermatitis, unspecified cause: Secondary | ICD-10-CM

## 2009-10-17 DIAGNOSIS — F329 Major depressive disorder, single episode, unspecified: Secondary | ICD-10-CM

## 2009-10-17 DIAGNOSIS — F411 Generalized anxiety disorder: Secondary | ICD-10-CM | POA: Insufficient documentation

## 2009-10-17 DIAGNOSIS — F323 Major depressive disorder, single episode, severe with psychotic features: Secondary | ICD-10-CM | POA: Insufficient documentation

## 2009-10-17 HISTORY — DX: Major depressive disorder, single episode, severe with psychotic features: F32.3

## 2009-10-20 ENCOUNTER — Emergency Department (HOSPITAL_COMMUNITY): Admission: EM | Admit: 2009-10-20 | Discharge: 2009-10-21 | Payer: Self-pay | Admitting: Emergency Medicine

## 2009-10-21 IMAGING — CT CT HEAD W/O CM
1 series · 16 of 30 positions shown, 20 images · non-contrast
Comparison: Head CT [DATE].

CLINICAL DATA: Headache for two or 3 weeks, worse over the last 3
days.  Nausea and vomiting.

CT HEAD WITHOUT CONTRAST
TECHNIQUE: Contiguous axial images were obtained from the base of
the skull through the vertex without contrast.

[Series 2: head_seq 4.5 h37s st · axial · 0.43mm/px · z∈[-148,-4]mm · 16 of 36 slices shown, 20 images]
[im 2/36  brain]
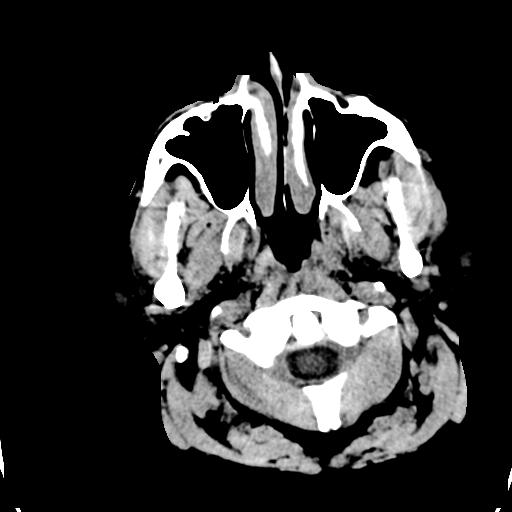
[im 2/36  bone]
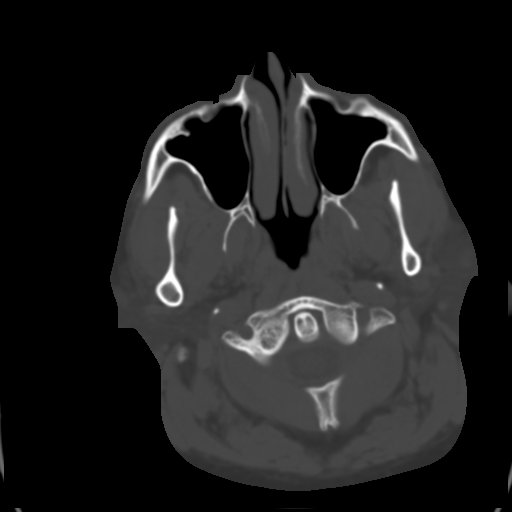
[im 4/36  brain]
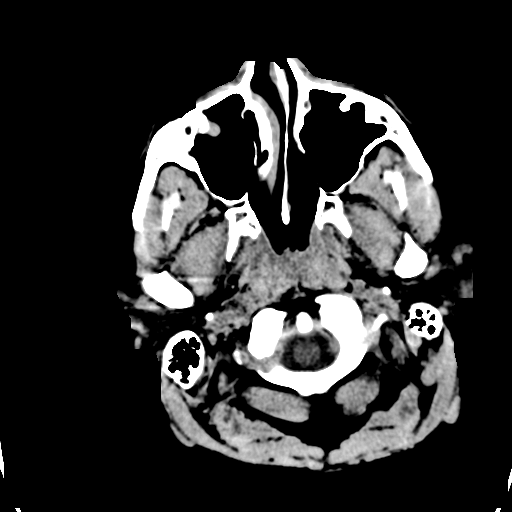
[im 7/36  brain]
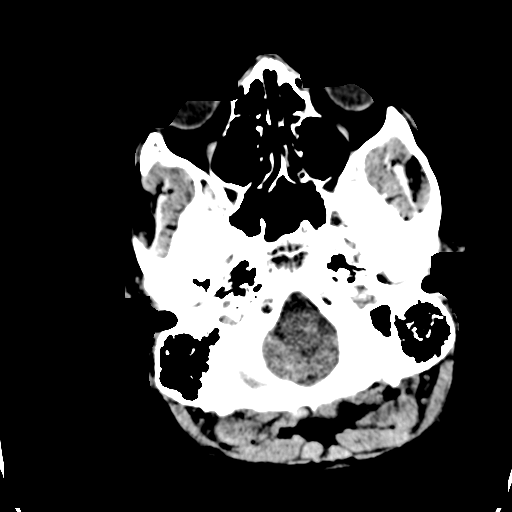
[im 9/36  brain]
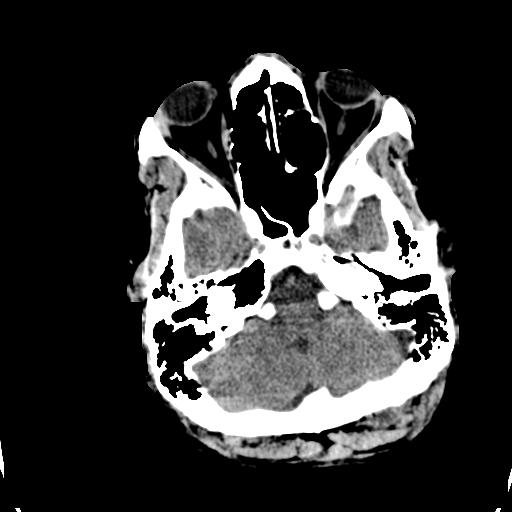
[im 10/36  brain]
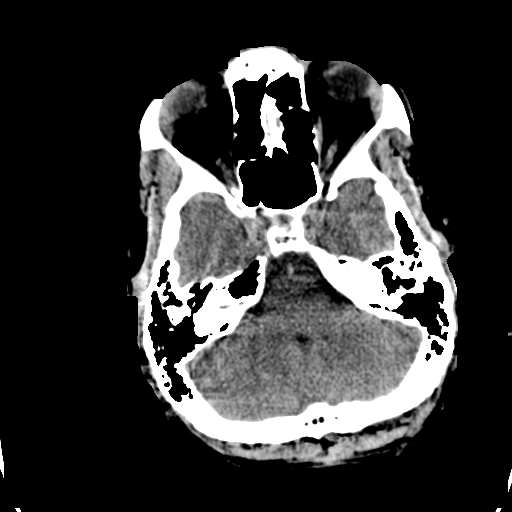
[im 10/36  bone]
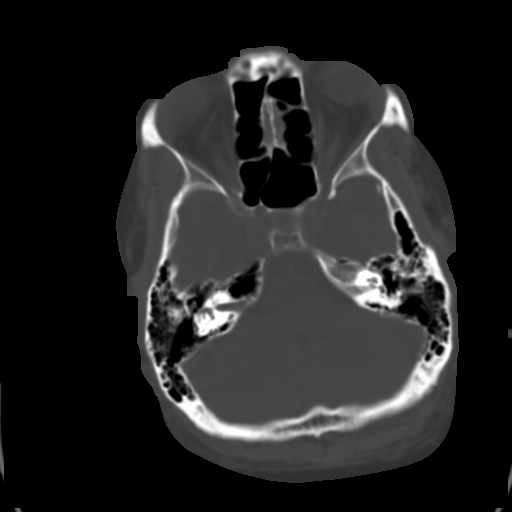
[im 13/36  brain]
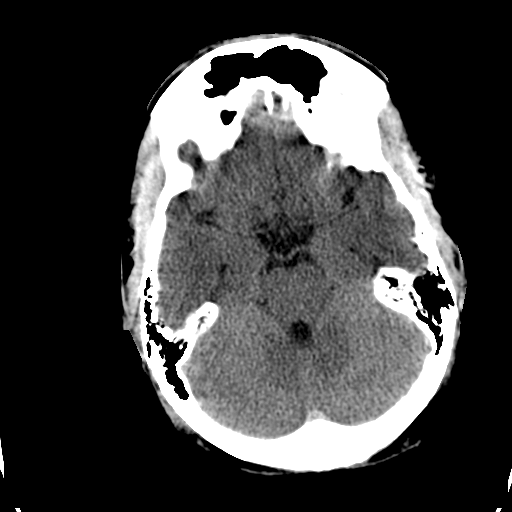
[im 15/36  brain]
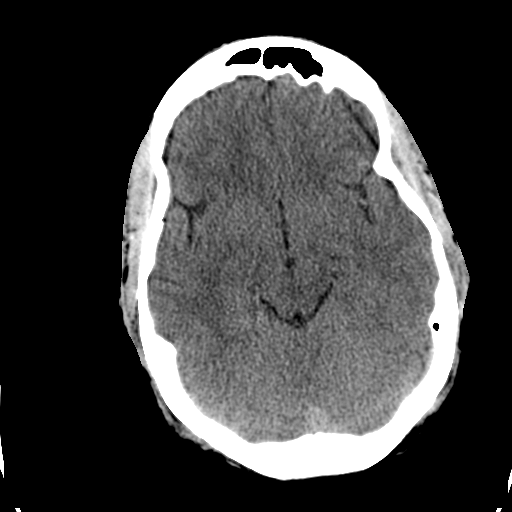
[im 17/36  brain]
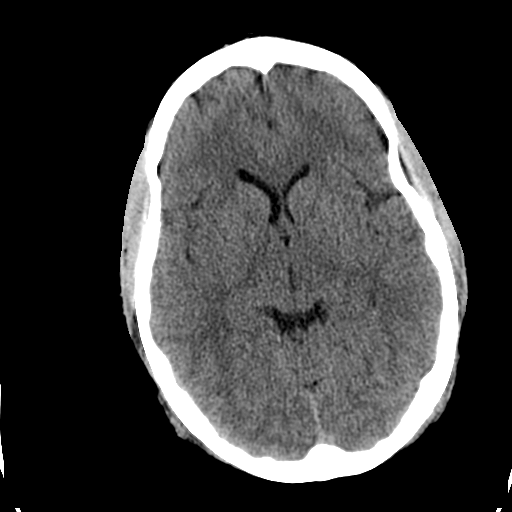
[im 19/36  brain]
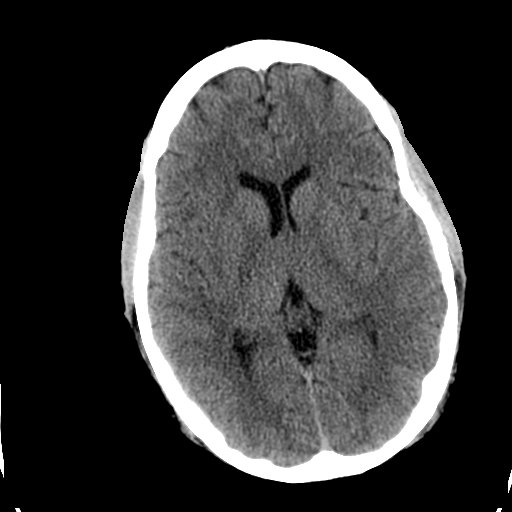
[im 19/36  bone]
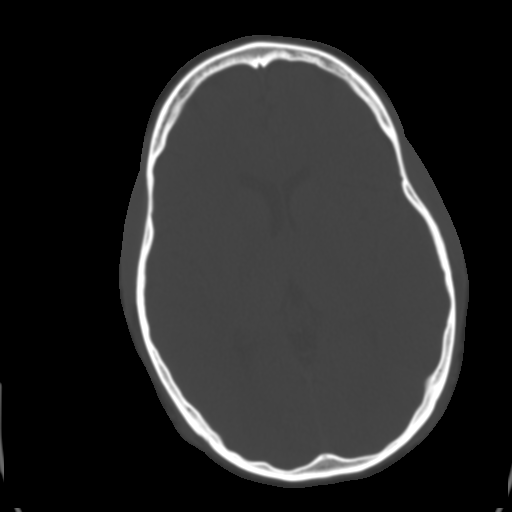
[im 21/36  brain]
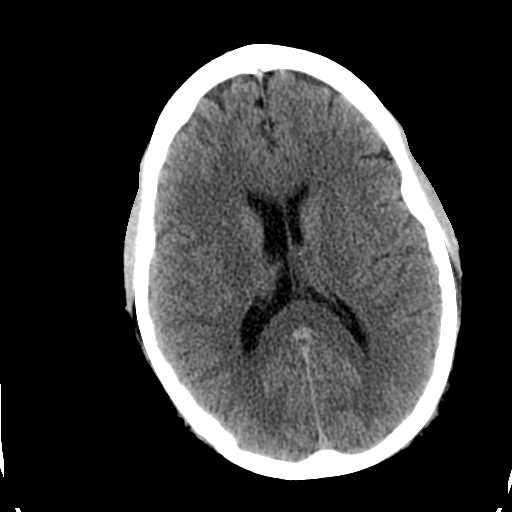
[im 23/36  brain]
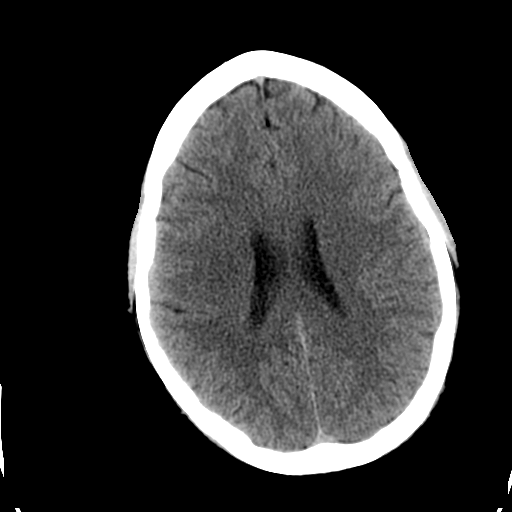
[im 26/36  brain]
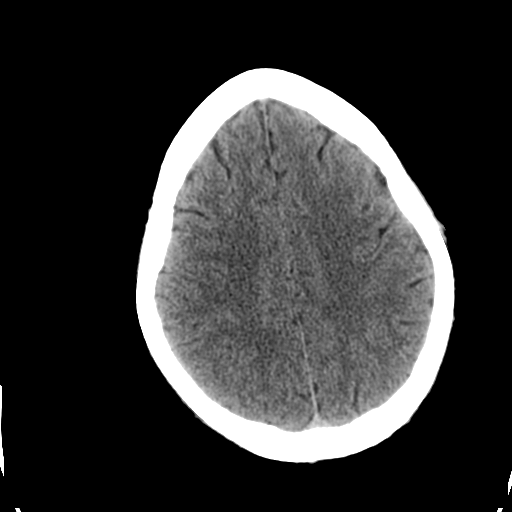
[im 27/36  brain]
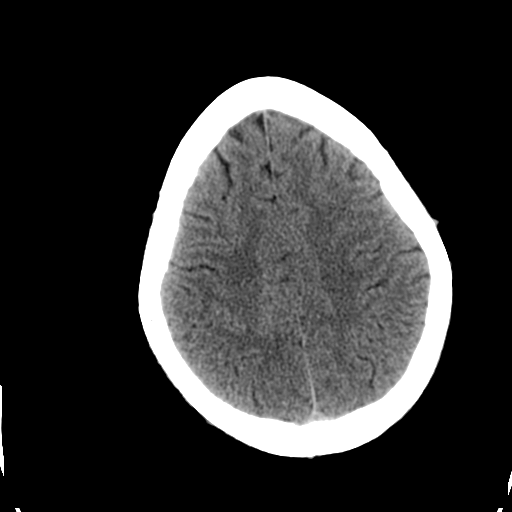
[im 27/36  bone]
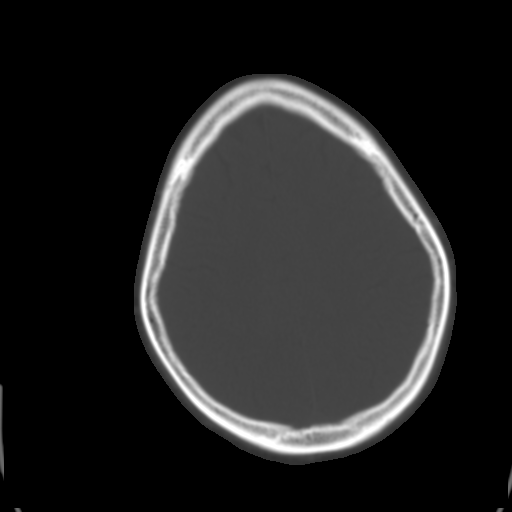
[im 29/36  brain]
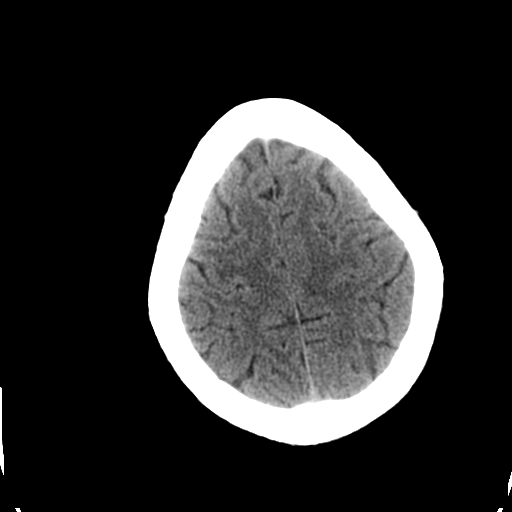
[im 32/36  brain]
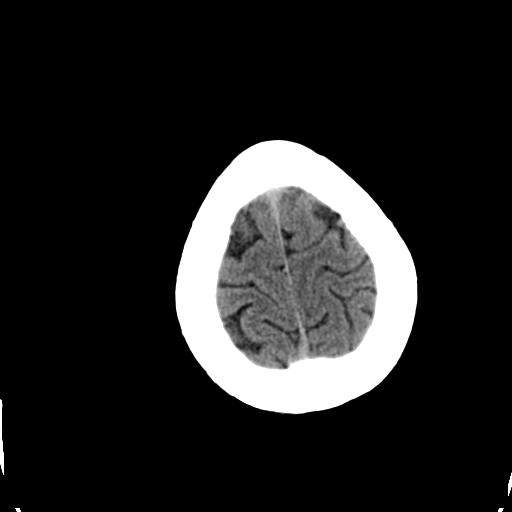
[im 34/36  brain]
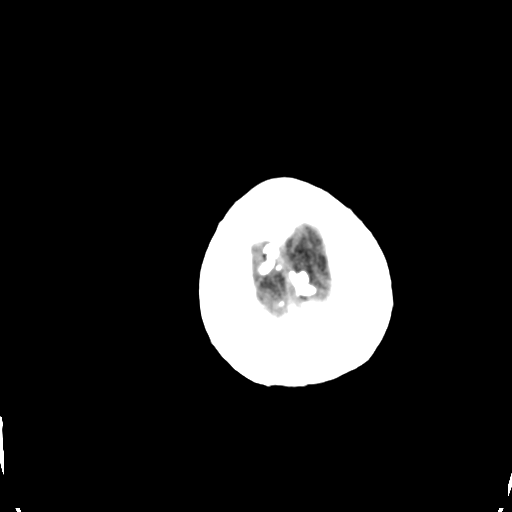

[16 of 30 positions shown; findings below may reference images not displayed]

FINDINGS: There is no evidence of acute intracranial hemorrhage,
mass lesion, brain edema or extra-axial fluid collection.  The
ventricles and subarachnoid spaces are appropriately sized for age.
There is no CT evidence of acute cortical infarction.

The visualized paranasal sinuses are clear aside from a probable
small mucous retention cyst anteriorly in the right maxillary
sinus. The calvarium is intact.
IMPRESSION: Stable unremarkable noncontrast head CT.  No acute intracranial
findings.

## 2009-11-01 ENCOUNTER — Emergency Department (HOSPITAL_COMMUNITY): Admission: EM | Admit: 2009-11-01 | Discharge: 2009-11-01 | Payer: Self-pay | Admitting: Emergency Medicine

## 2009-11-02 ENCOUNTER — Ambulatory Visit: Payer: Self-pay | Admitting: Internal Medicine

## 2009-11-13 ENCOUNTER — Emergency Department (HOSPITAL_COMMUNITY): Admission: EM | Admit: 2009-11-13 | Discharge: 2009-11-13 | Payer: Self-pay | Admitting: Emergency Medicine

## 2009-11-13 IMAGING — CR DG CHEST 2V
2 series · 2 of 2 positions shown · non-contrast
Comparison: [DATE]

CLINICAL DATA: Cough, fever, chest congestion, and wheezing.

CHEST - 2 VIEW

[w chest pa]
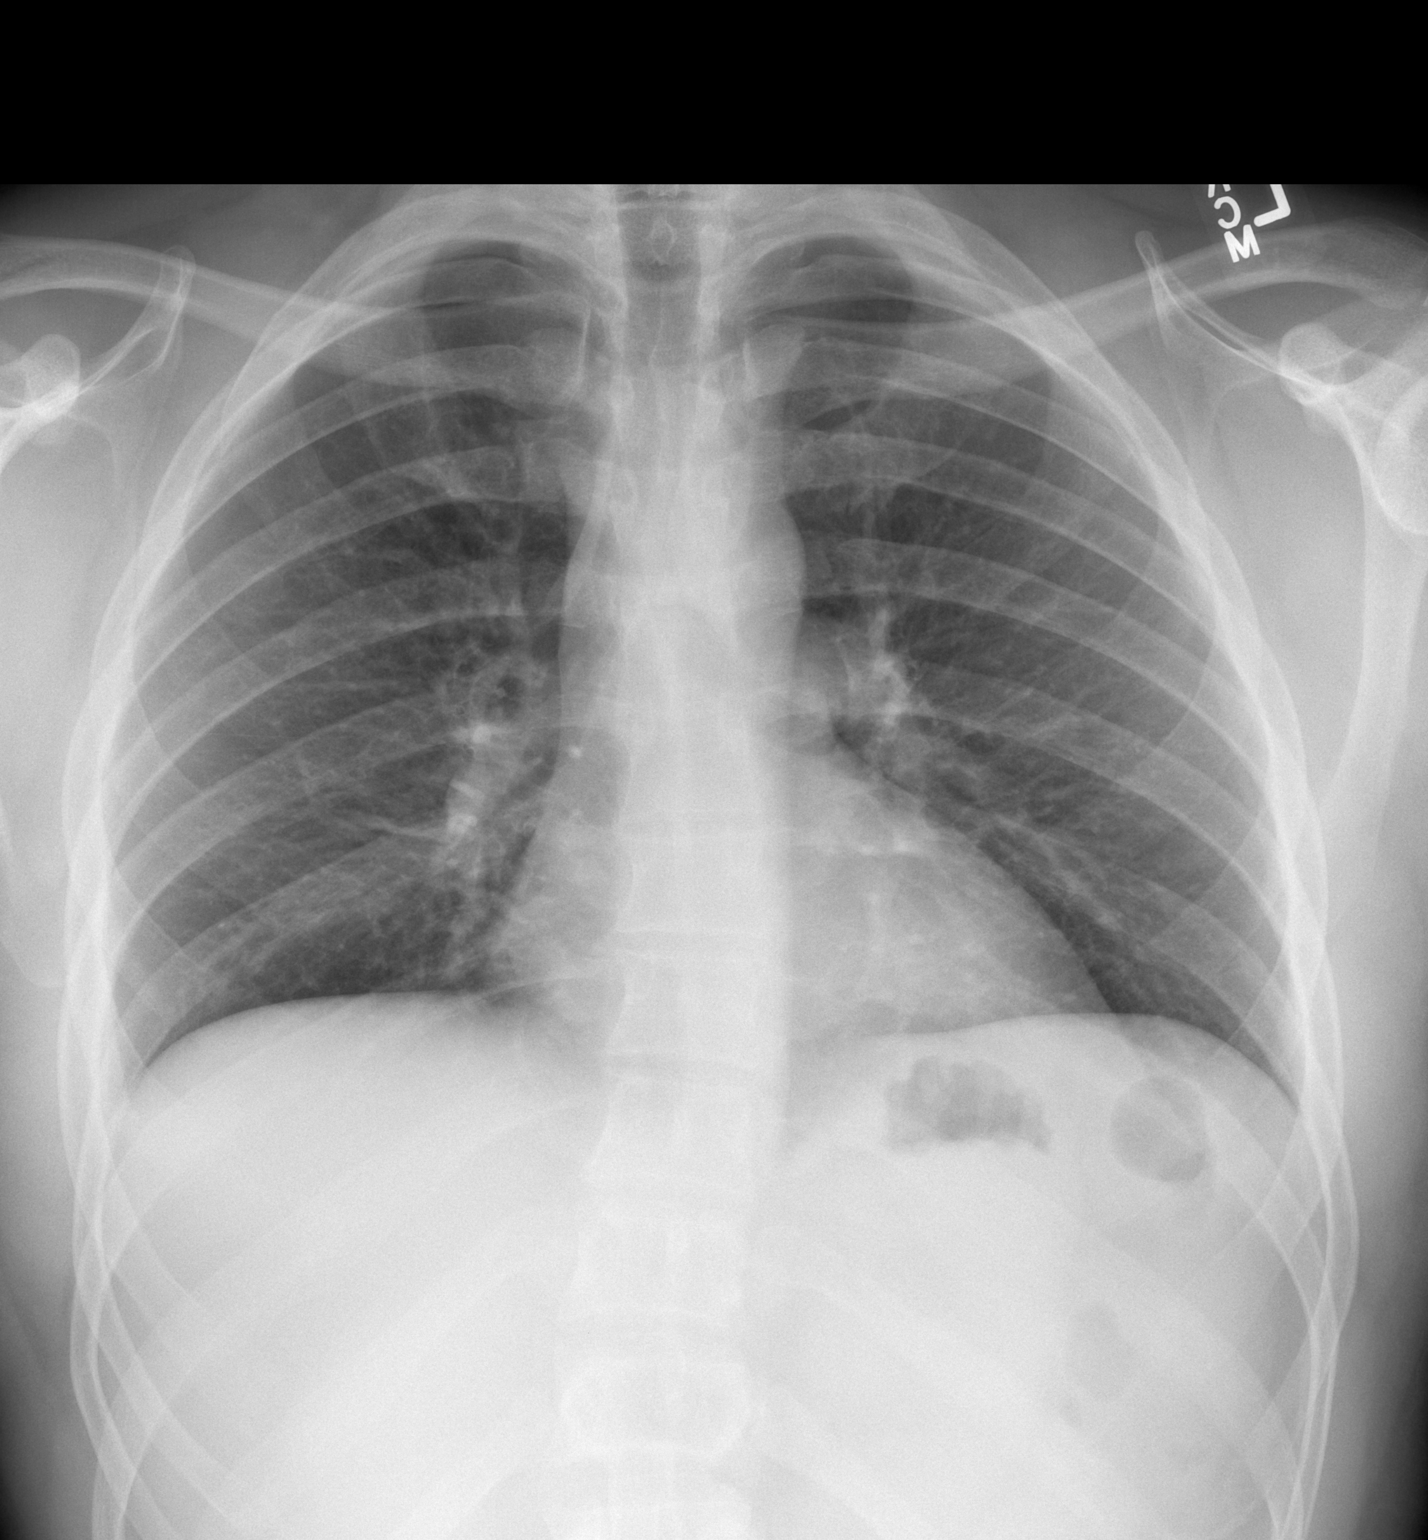

[w chest lat]
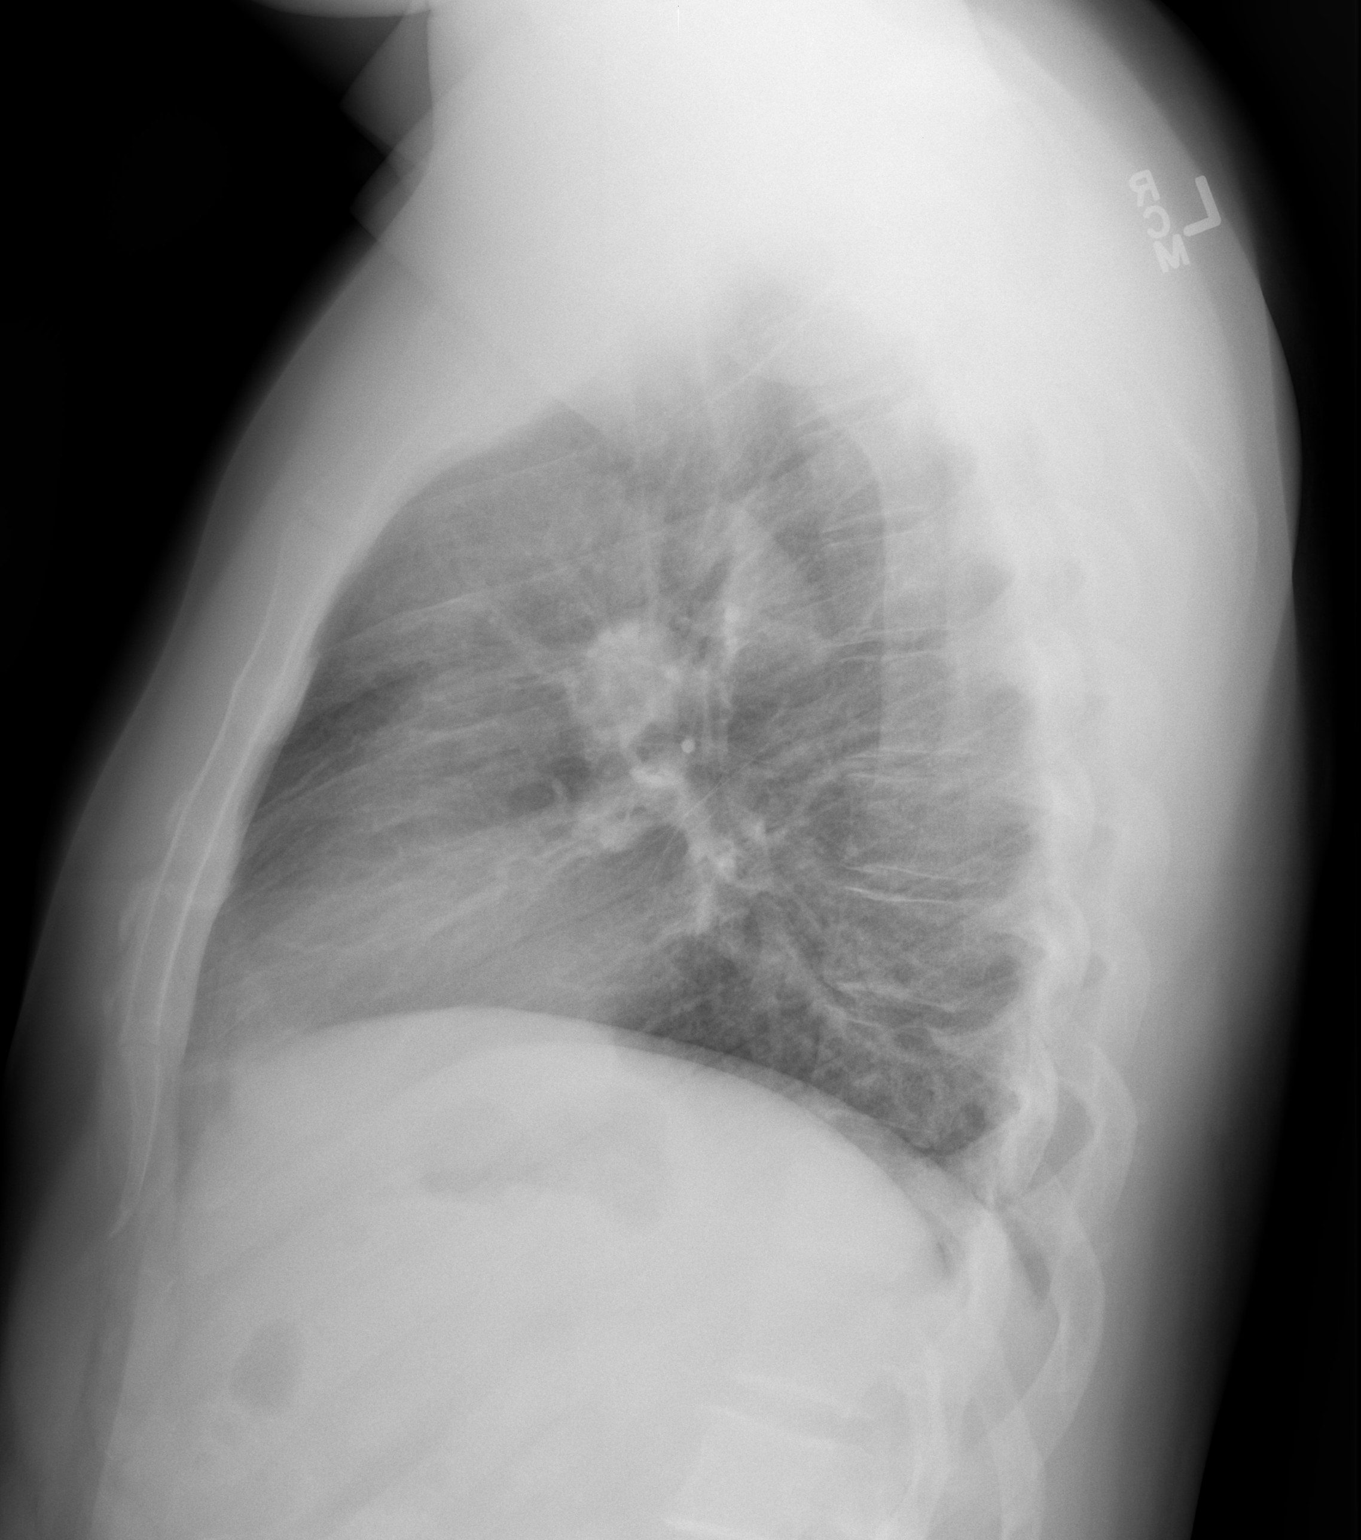

[2 of 2 positions shown; findings below may reference images not displayed]

FINDINGS: The heart size and vascularity are normal and the lungs
are clear.  No significant bony abnormality.
IMPRESSION: Normal chest.

## 2009-11-21 ENCOUNTER — Telehealth (INDEPENDENT_AMBULATORY_CARE_PROVIDER_SITE_OTHER): Payer: Self-pay | Admitting: *Deleted

## 2009-12-01 ENCOUNTER — Ambulatory Visit: Payer: Self-pay | Admitting: Internal Medicine

## 2009-12-01 DIAGNOSIS — J209 Acute bronchitis, unspecified: Secondary | ICD-10-CM | POA: Insufficient documentation

## 2009-12-08 ENCOUNTER — Encounter: Payer: Self-pay | Admitting: Internal Medicine

## 2009-12-13 ENCOUNTER — Ambulatory Visit: Payer: Self-pay | Admitting: Internal Medicine

## 2009-12-26 ENCOUNTER — Encounter (INDEPENDENT_AMBULATORY_CARE_PROVIDER_SITE_OTHER): Payer: Self-pay | Admitting: *Deleted

## 2009-12-26 ENCOUNTER — Emergency Department (HOSPITAL_COMMUNITY): Admission: EM | Admit: 2009-12-26 | Discharge: 2009-12-26 | Payer: Self-pay | Admitting: Emergency Medicine

## 2009-12-26 IMAGING — CR DG CHEST 2V
2 series · 2 of 2 positions shown · non-contrast
Comparison: [DATE]

CLINICAL DATA: Chest pain, dizziness.

CHEST - 2 VIEW

[w chest pa]
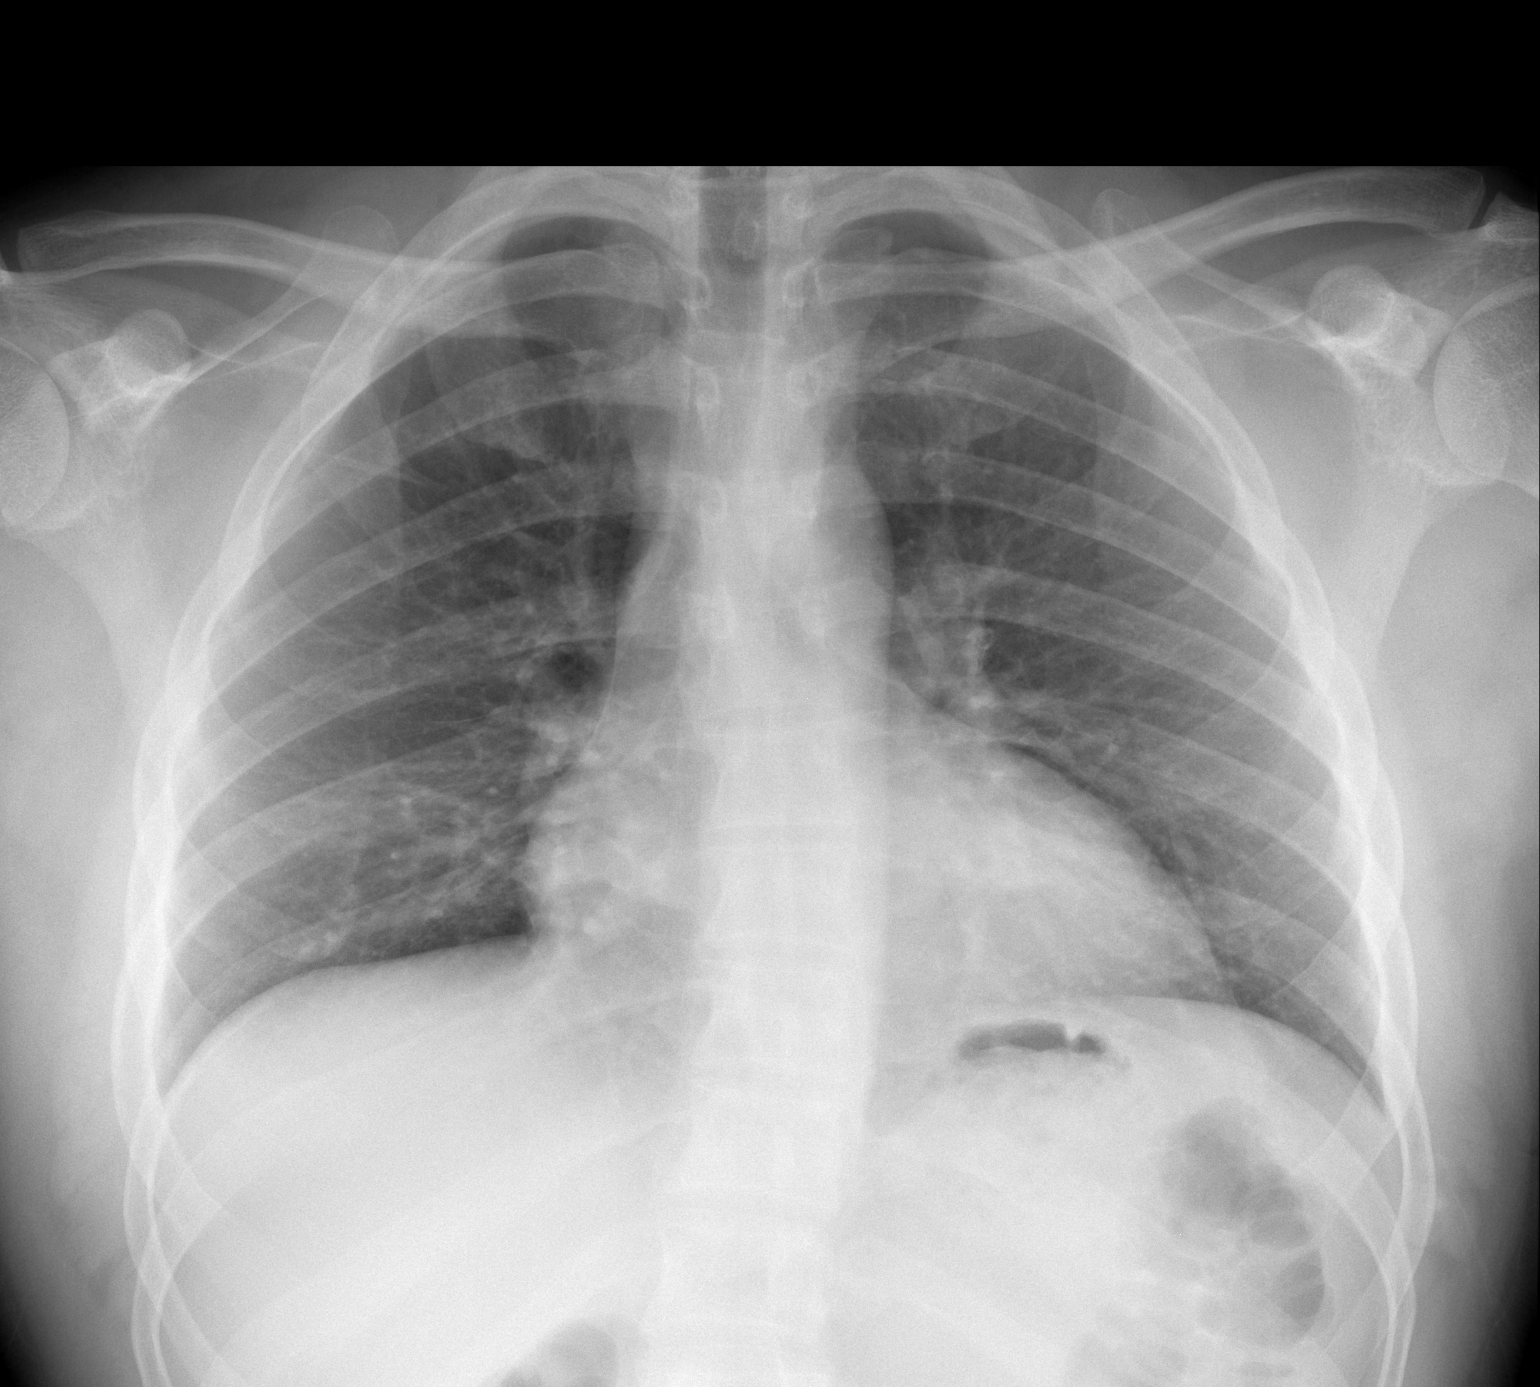

[w chest lat]
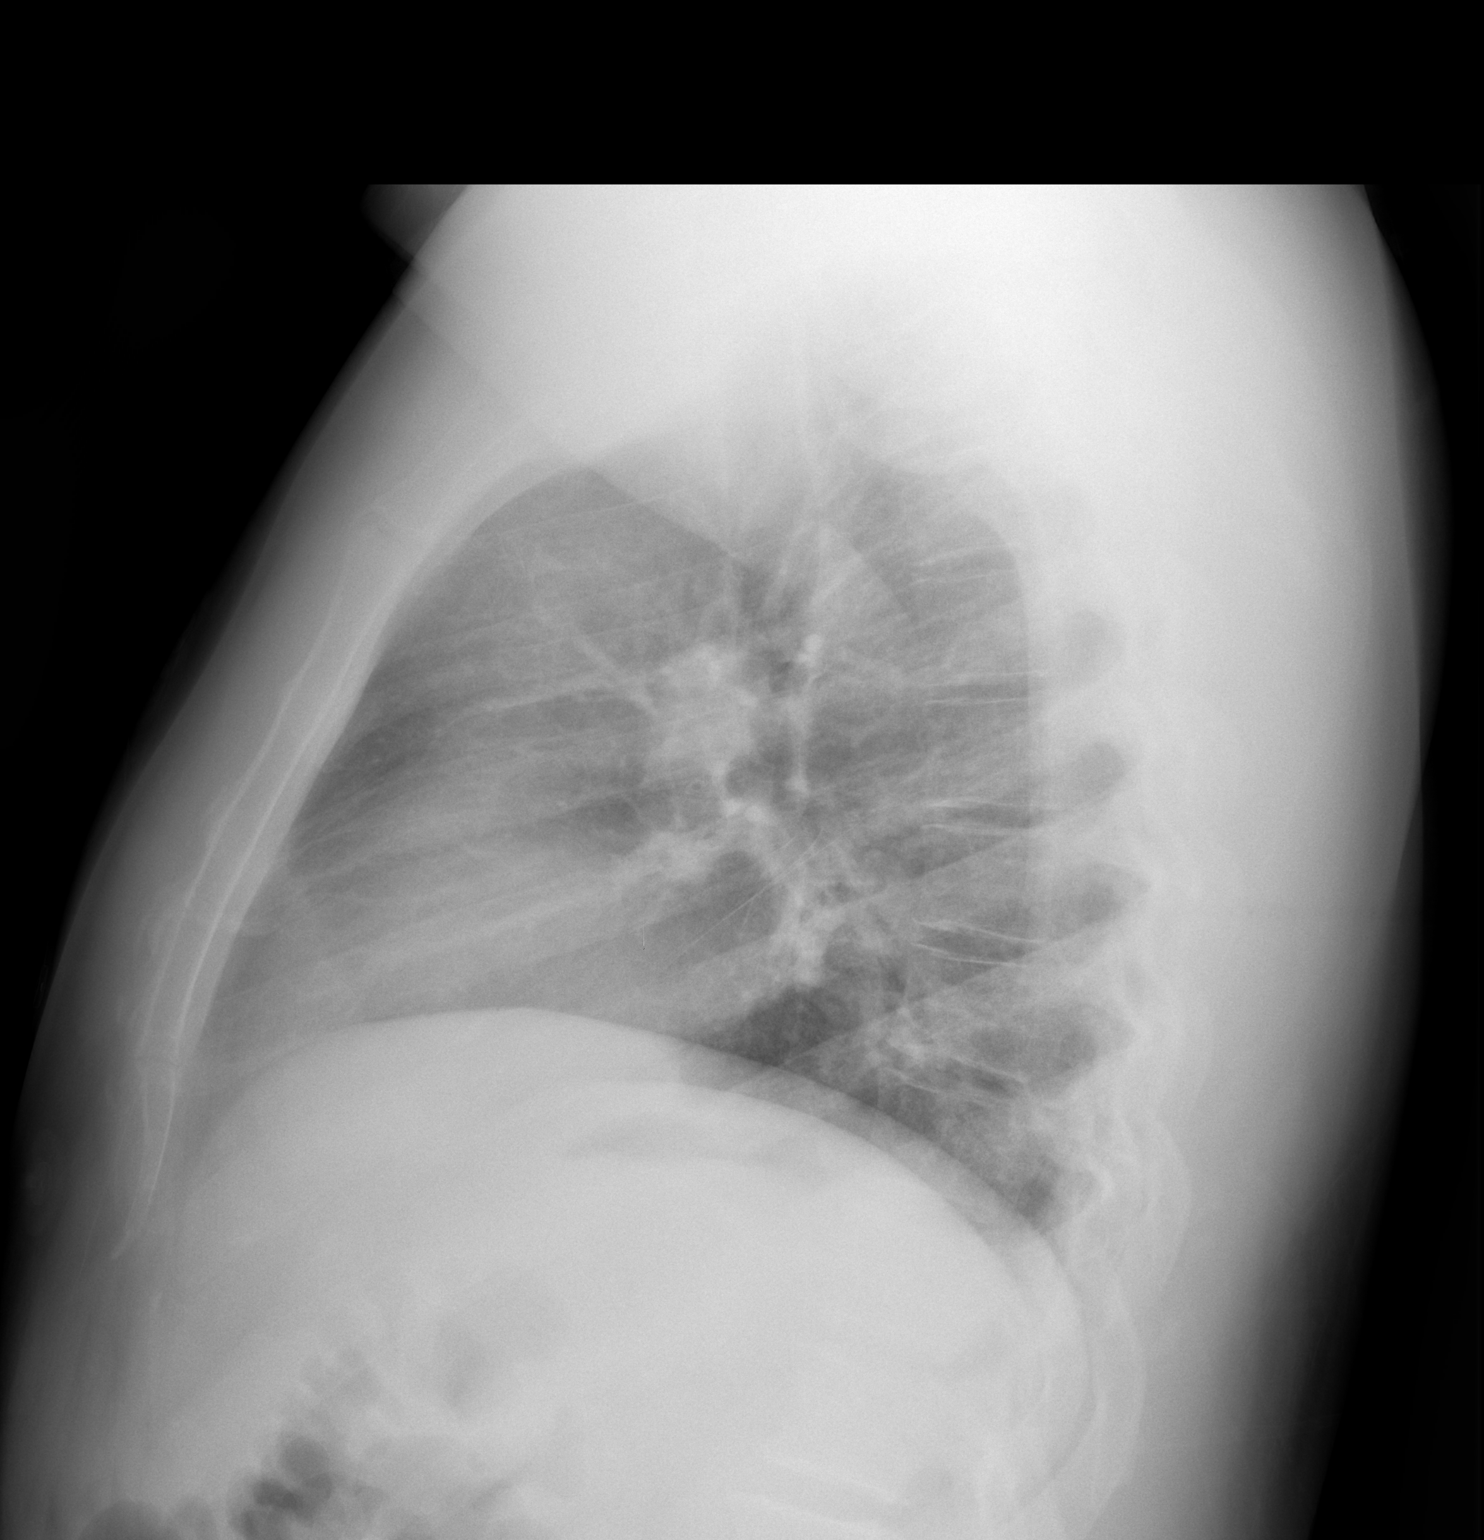

[2 of 2 positions shown; findings below may reference images not displayed]

FINDINGS: Heart is borderline in size.  There are low lung volumes
without focal opacity or effusion.  No acute bony abnormality.
IMPRESSION: Low lung volumes.  No active disease.

## 2010-01-07 ENCOUNTER — Emergency Department (HOSPITAL_COMMUNITY): Admission: EM | Admit: 2010-01-07 | Discharge: 2010-01-07 | Payer: Self-pay | Admitting: Emergency Medicine

## 2010-01-09 ENCOUNTER — Inpatient Hospital Stay (HOSPITAL_COMMUNITY): Admission: AD | Admit: 2010-01-09 | Discharge: 2010-01-11 | Payer: Self-pay | Admitting: Psychiatry

## 2010-01-09 ENCOUNTER — Ambulatory Visit: Payer: Self-pay | Admitting: Psychiatry

## 2010-01-17 ENCOUNTER — Emergency Department (HOSPITAL_BASED_OUTPATIENT_CLINIC_OR_DEPARTMENT_OTHER): Admission: EM | Admit: 2010-01-17 | Discharge: 2010-01-17 | Payer: Self-pay | Admitting: Emergency Medicine

## 2010-01-20 ENCOUNTER — Emergency Department (HOSPITAL_COMMUNITY): Admission: EM | Admit: 2010-01-20 | Discharge: 2010-01-21 | Payer: Self-pay | Admitting: Emergency Medicine

## 2010-01-21 ENCOUNTER — Inpatient Hospital Stay (HOSPITAL_COMMUNITY): Admission: AD | Admit: 2010-01-21 | Discharge: 2010-01-26 | Payer: Self-pay | Admitting: Psychiatry

## 2010-02-02 ENCOUNTER — Encounter (INDEPENDENT_AMBULATORY_CARE_PROVIDER_SITE_OTHER): Payer: Self-pay | Admitting: *Deleted

## 2010-02-26 ENCOUNTER — Emergency Department (HOSPITAL_COMMUNITY): Admission: EM | Admit: 2010-02-26 | Discharge: 2010-02-26 | Payer: Self-pay | Admitting: Emergency Medicine

## 2010-02-26 IMAGING — CR DG CHEST 2V
2 series · 2 of 2 positions shown · non-contrast
Comparison: Chest radiograph performed [DATE]

CLINICAL DATA: Productive cough; congestion and shaking.  History
of smoking and HIV.

CHEST - 2 VIEW

[w chest pa *]
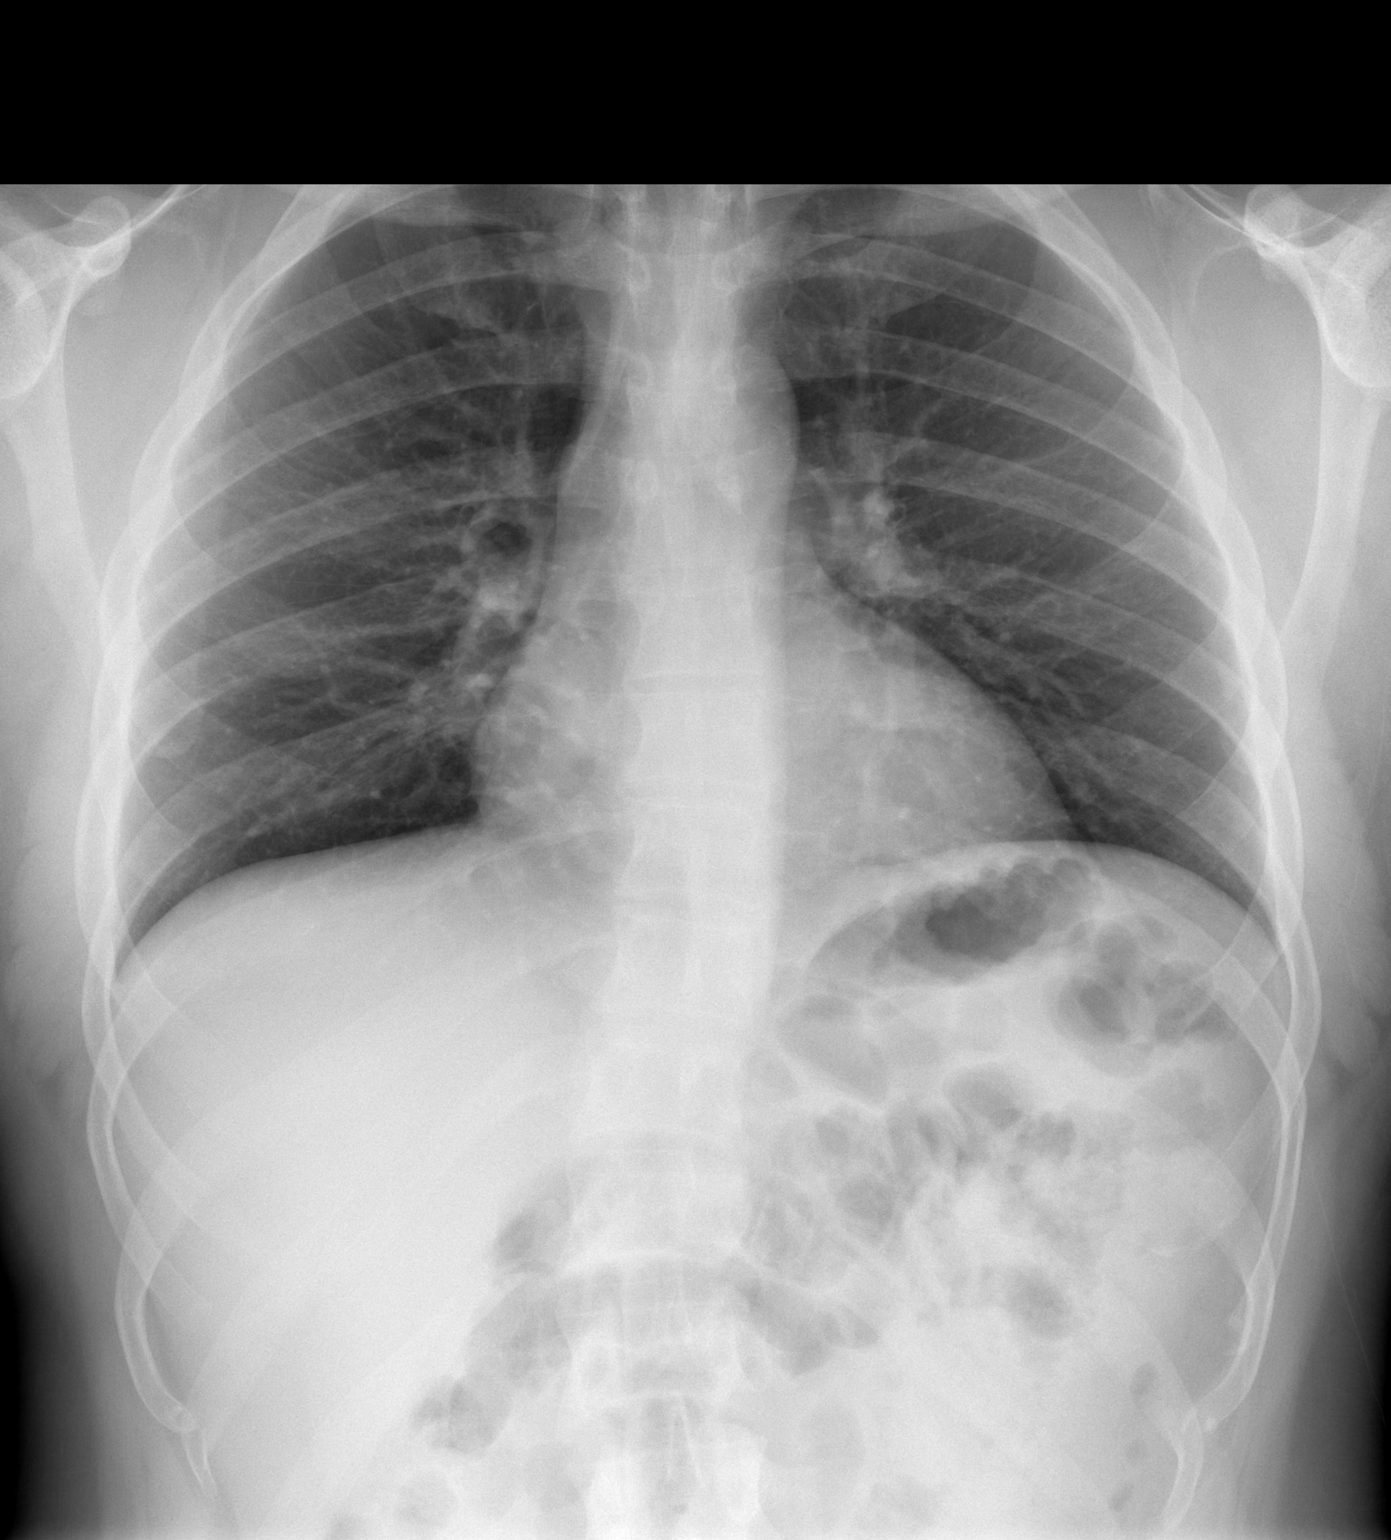

[w chest lat *]
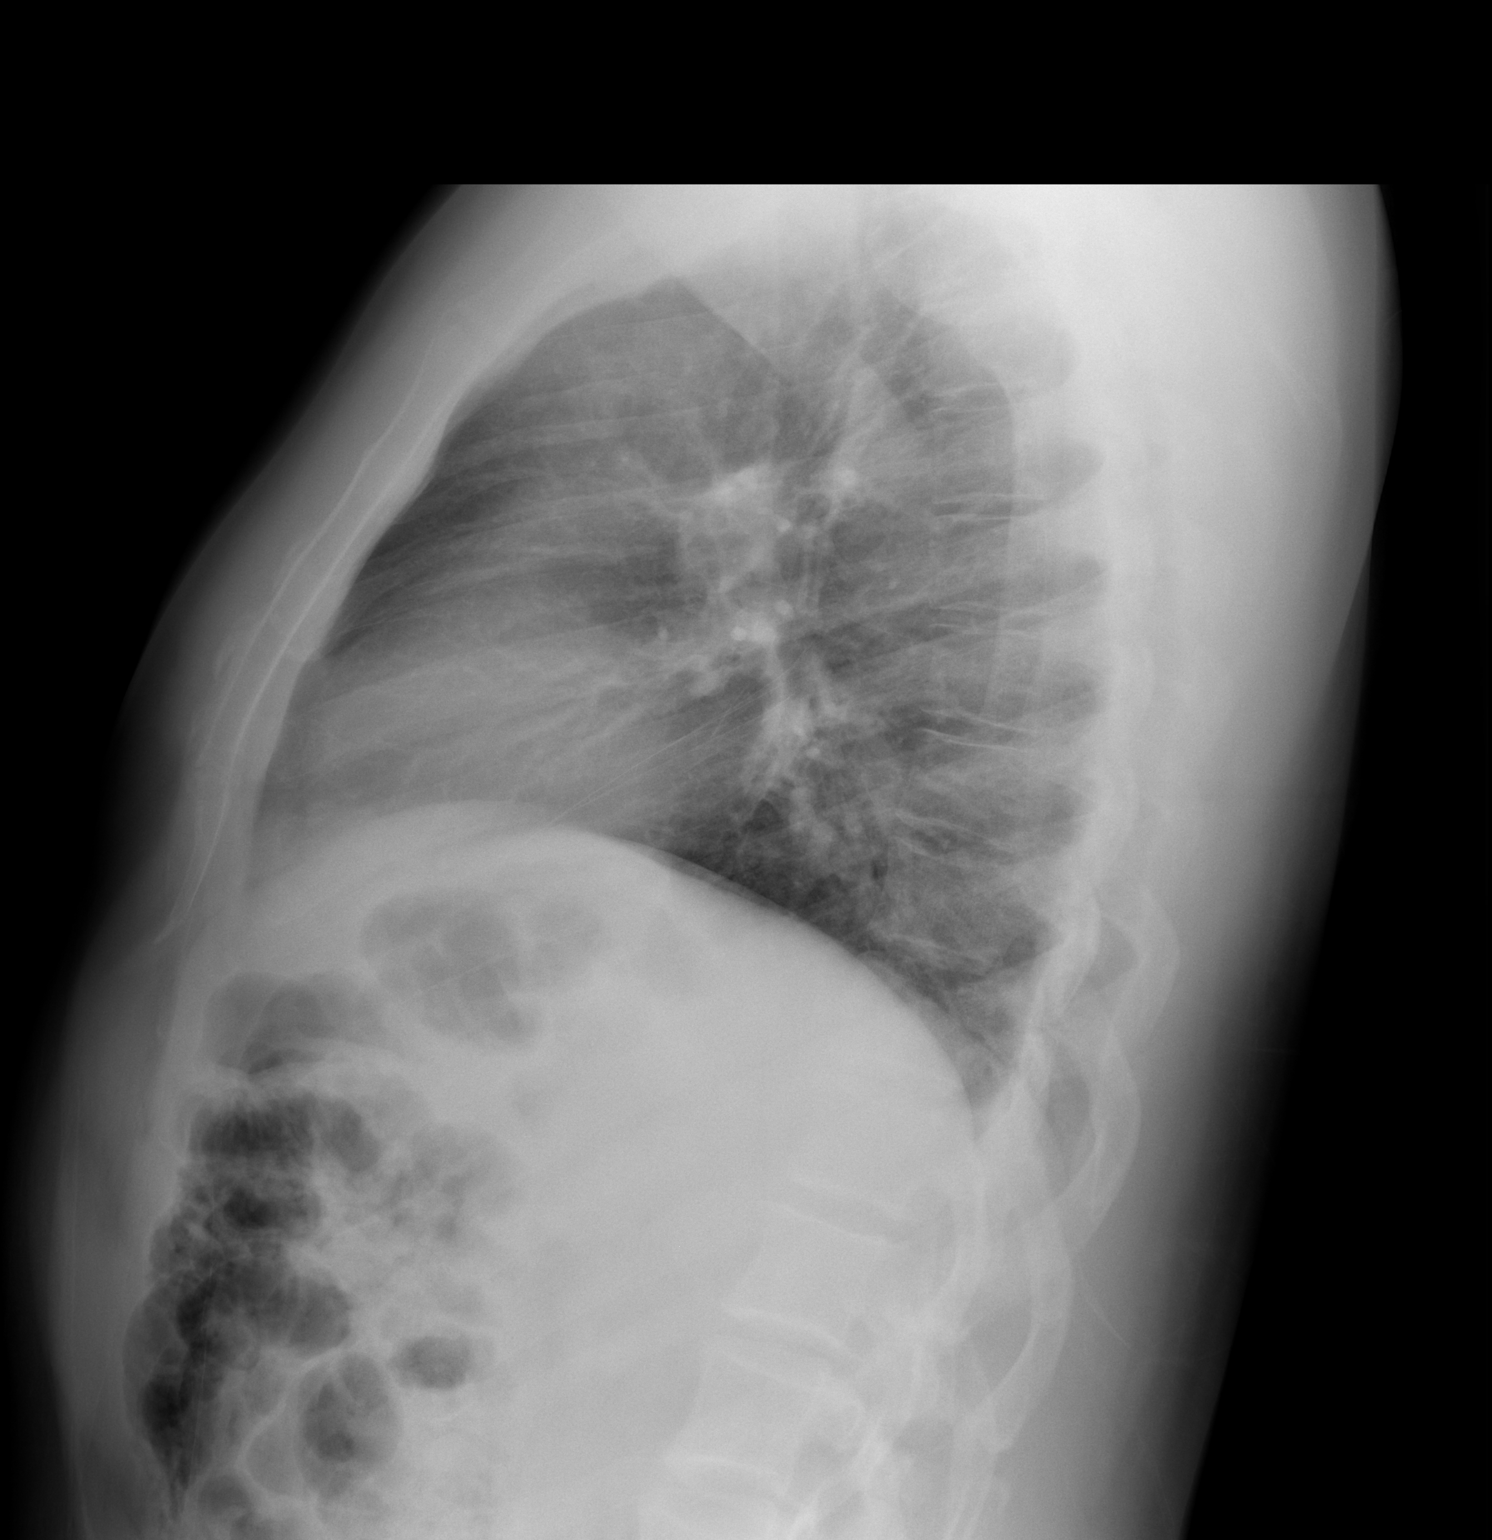

[2 of 2 positions shown; findings below may reference images not displayed]

FINDINGS: The lungs are mildly hypoexpanded but appear clear.
There is no evidence of focal opacification, pleural effusion or
pneumothorax.

The heart is normal in size; the mediastinal contour is within
normal limits.  No acute osseous abnormalities are seen.
IMPRESSION: Mildly hypoexpanded but clear lungs.

## 2010-03-09 ENCOUNTER — Ambulatory Visit: Payer: Self-pay | Admitting: Internal Medicine

## 2010-03-09 LAB — CONVERTED CEMR LAB
ALT: 21 units/L (ref 0–53)
AST: 20 units/L (ref 0–37)
Albumin: 4.4 g/dL (ref 3.5–5.2)
Alkaline Phosphatase: 113 units/L (ref 39–117)
BUN: 9 mg/dL (ref 6–23)
Basophils Absolute: 0 10*3/uL (ref 0.0–0.1)
Basophils Relative: 0 % (ref 0–1)
CO2: 26 meq/L (ref 19–32)
Calcium: 9.4 mg/dL (ref 8.4–10.5)
Chloride: 105 meq/L (ref 96–112)
Creatinine, Ser: 1.12 mg/dL (ref 0.40–1.50)
Eosinophils Absolute: 0.1 10*3/uL (ref 0.0–0.7)
Eosinophils Relative: 1 % (ref 0–5)
Glucose, Bld: 87 mg/dL (ref 70–99)
HCT: 45.1 % (ref 39.0–52.0)
HIV 1 RNA Quant: 20400 copies/mL — ABNORMAL HIGH (ref <20)
HIV-1 RNA Quant, Log: 4.31 — ABNORMAL HIGH (ref <1.30)
Hemoglobin: 15.5 g/dL (ref 13.0–17.0)
Lymphocytes Relative: 38 % (ref 12–46)
Lymphs Abs: 2.9 10*3/uL (ref 0.7–4.0)
MCHC: 34.4 g/dL (ref 30.0–36.0)
MCV: 91.1 fL (ref 78.0–100.0)
Monocytes Absolute: 0.5 10*3/uL (ref 0.1–1.0)
Monocytes Relative: 7 % (ref 3–12)
Neutro Abs: 4.1 10*3/uL (ref 1.7–7.7)
Neutrophils Relative %: 55 % (ref 43–77)
Platelets: 204 10*3/uL (ref 150–400)
Potassium: 4.5 meq/L (ref 3.5–5.3)
RBC: 4.95 M/uL (ref 4.22–5.81)
RDW: 13.2 % (ref 11.5–15.5)
Sodium: 138 meq/L (ref 135–145)
Total Bilirubin: 0.5 mg/dL (ref 0.3–1.2)
Total Protein: 7.4 g/dL (ref 6.0–8.3)
WBC: 7.6 10*3/uL (ref 4.0–10.5)

## 2010-03-14 ENCOUNTER — Emergency Department (HOSPITAL_COMMUNITY): Admission: EM | Admit: 2010-03-14 | Discharge: 2010-03-14 | Payer: Self-pay | Admitting: Emergency Medicine

## 2010-04-10 ENCOUNTER — Telehealth (INDEPENDENT_AMBULATORY_CARE_PROVIDER_SITE_OTHER): Payer: Self-pay | Admitting: *Deleted

## 2010-04-20 ENCOUNTER — Ambulatory Visit: Payer: Self-pay | Admitting: Internal Medicine

## 2010-05-02 ENCOUNTER — Encounter (INDEPENDENT_AMBULATORY_CARE_PROVIDER_SITE_OTHER): Payer: Self-pay | Admitting: *Deleted

## 2010-05-02 ENCOUNTER — Ambulatory Visit: Payer: Self-pay | Admitting: Internal Medicine

## 2010-05-13 ENCOUNTER — Emergency Department (HOSPITAL_COMMUNITY)
Admission: EM | Admit: 2010-05-13 | Discharge: 2010-05-14 | Disposition: A | Discharge status: Other Acute Inpatient Hospital | Payer: Self-pay | Source: Home / Self Care | Admitting: Emergency Medicine

## 2010-05-14 ENCOUNTER — Inpatient Hospital Stay (HOSPITAL_COMMUNITY)
Admission: RE | Admit: 2010-05-14 | Discharge: 2010-05-22 | Payer: Self-pay | Source: Home / Self Care | Attending: Psychiatry | Admitting: Psychiatry

## 2010-05-18 ENCOUNTER — Encounter: Payer: Self-pay | Admitting: Internal Medicine

## 2010-05-23 ENCOUNTER — Encounter (INDEPENDENT_AMBULATORY_CARE_PROVIDER_SITE_OTHER): Payer: Self-pay | Admitting: *Deleted

## 2010-05-30 ENCOUNTER — Emergency Department (HOSPITAL_COMMUNITY)
Admission: EM | Admit: 2010-05-30 | Discharge: 2010-05-31 | Disposition: A | Discharge status: Other Acute Inpatient Hospital | Payer: Self-pay | Source: Home / Self Care | Admitting: Emergency Medicine

## 2010-05-31 ENCOUNTER — Inpatient Hospital Stay (HOSPITAL_COMMUNITY)
Admission: AD | Admit: 2010-05-31 | Discharge: 2010-06-07 | Payer: Self-pay | Source: Home / Self Care | Attending: Psychiatry | Admitting: Psychiatry

## 2010-05-31 LAB — DIFFERENTIAL
Basophils Absolute: 0 10*3/uL (ref 0.0–0.1)
Basophils Relative: 0 % (ref 0–1)
Eosinophils Absolute: 0.1 10*3/uL (ref 0.0–0.7)
Eosinophils Relative: 1 % (ref 0–5)
Lymphocytes Relative: 47 % — ABNORMAL HIGH (ref 12–46)
Lymphs Abs: 2.9 10*3/uL (ref 0.7–4.0)
Monocytes Absolute: 0.5 10*3/uL (ref 0.1–1.0)
Monocytes Relative: 8 % (ref 3–12)
Neutro Abs: 2.7 10*3/uL (ref 1.7–7.7)
Neutrophils Relative %: 44 % (ref 43–77)

## 2010-05-31 LAB — BASIC METABOLIC PANEL
BUN: 8 mg/dL (ref 6–23)
CO2: 26 mEq/L (ref 19–32)
Calcium: 9.6 mg/dL (ref 8.4–10.5)
Chloride: 105 mEq/L (ref 96–112)
Creatinine, Ser: 1.18 mg/dL (ref 0.4–1.5)
GFR calc Af Amer: >60 mL/min (ref >60)
GFR calc non Af Amer: >60 mL/min (ref >60)
Glucose, Bld: 87 mg/dL (ref 70–99)
Potassium: 3.7 mEq/L (ref 3.5–5.1)
Sodium: 141 mEq/L (ref 135–145)

## 2010-05-31 LAB — CBC
HCT: 48.2 % (ref 39.0–52.0)
Hemoglobin: 16.4 g/dL (ref 13.0–17.0)
MCH: 31.3 pg (ref 26.0–34.0)
MCHC: 34 g/dL (ref 30.0–36.0)
MCV: 92 fL (ref 78.0–100.0)
Platelets: 191 10*3/uL (ref 150–400)
RBC: 5.24 MIL/uL (ref 4.22–5.81)
RDW: 12.8 % (ref 11.5–15.5)
WBC: 6.1 10*3/uL (ref 4.0–10.5)

## 2010-05-31 LAB — RAPID URINE DRUG SCREEN, HOSP PERFORMED
Amphetamines: NOT DETECTED
Barbiturates: NOT DETECTED
Benzodiazepines: NOT DETECTED
Cocaine: NOT DETECTED
Opiates: NOT DETECTED
Tetrahydrocannabinol: NOT DETECTED

## 2010-05-31 LAB — TRICYCLICS SCREEN, URINE: TCA Scrn: NOT DETECTED

## 2010-05-31 LAB — ETHANOL: Alcohol, Ethyl (B): <5 mg/dL (ref 0–10)

## 2010-06-06 LAB — TSH: TSH: 0.885 u[IU]/mL (ref 0.350–4.500)

## 2010-06-06 LAB — VITAMIN B12: Vitamin B-12: 415 pg/mL (ref 211–911)

## 2010-06-06 LAB — FOLATE RBC: RBC Folate: 291 ng/mL (ref 180–600)
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
## 2010-06-13 NOTE — Miscellaneous (Signed)
Summary: amoxicillin allergy  Clinical Lists Changes  Allergies: Added new allergy or adverse reaction of AMOXICILLIN Observations: Added new observation of NKA: F (12/26/2009 14:23)

## 2010-06-13 NOTE — Progress Notes (Signed)
Summary: lab results  Phone Note Call from Patient   Caller: Patient Summary of Call: pt. called for lab results, per Dr.Vollmer, pt. given results Initial call taken by: Wendall Mola CMA Duncan Dull),  April 10, 2010 5:07 PM

## 2010-06-13 NOTE — Assessment & Plan Note (Signed)
Summary: ER FU/VS   CC:  ER f/u, pt. had episode of "passing out" yesterday 11/01/09, and Depression.  History of Present Illness: Pt c/o anxiety and panic attacks.  He does not feel like himself.  He is in counseling.  He feels like his nevervs are on fire.  He was given some xanax in the ED when he presented with a panic attack which helped.  He is having a hard time sleeping as well.  He had a syncopal episode and went to the ED and was told that it was due to anxiety.  Depression History:      The patient is having a depressed mood most of the day and has a diminished interest in his usual daily activities.        The patient denies that he feels like life is not worth living, denies that he wishes that he were dead, and denies that he has thought about ending his life.        Preventive Screening-Counseling & Management  Alcohol-Tobacco     Alcohol drinks/day: occasional     Alcohol type: wine     Smoking Status: current     Packs/Day: 0.5     Year Started: 2005  Caffeine-Diet-Exercise     Caffeine use/day: soda 5 per day     Does Patient Exercise: no  Safety-Violence-Falls     Seat Belt Use: yes      Sexual History:  n/a.        Drug Use:  never.     Updated Prior Medication List: ALPRAZOLAM 1 MG TABS (ALPRAZOLAM) Take 1 tablet by mouth every 8 hours as needed  Current Allergies (reviewed today): No known allergies  Past History:  Past Medical History: Last updated: 10/17/2009 Anxiety Depression  Review of Systems       The patient complains of syncope.  The patient denies anorexia, fever, weight loss, and chest pain.    Vital Signs:  Patient profile:   35 year old male Height:      73 inches (185.42 cm) Weight:      249.8 pounds (113.55 kg) BMI:     33.08 Temp:     98.0 degrees F (36.67 degrees C) oral Pulse rate:   71 / minute BP sitting:   145 / 95  (left arm)  Vitals Entered By: Wendall Mola CMA Duncan Dull) (November 02, 2009 2:01 PM) CC: ER f/u,  pt. had episode of "passing out" yesterday 11/01/09, Depression Is Patient Diabetic? No Pain Assessment Patient in pain? yes     Location: head Intensity: 10 Type: burning, sharp Onset of pain  Constant Nutritional Status BMI of > 30 = obese Nutritional Status Detail appetite "good"  Does patient need assistance? Functional Status Self care Ambulation Normal   Physical Exam  General:  alert, well-developed, well-nourished, and well-hydrated.   Head:  normocephalic and atraumatic.   Lungs:  normal breath sounds.     Impression & Recommendations:  Problem # 1:  ANXIETY (ICD-300.00) Pt stopped wellbutrin due to side effects.  Pt to continue counseling. Xanax as needed. The following medications were removed from the medication list:    Wellbutrin Sr 150 Mg Xr12h-tab (Bupropion hcl) .Marland Kitchen... Take 1 tablet by mouth once a day His updated medication list for this problem includes:    Alprazolam 1 Mg Tabs (Alprazolam) .Marland Kitchen... Take 1 tablet by mouth every 8 hours as needed  Orders: Est. Patient Level III (62130)  Medications Added to Medication List  This Visit: 1)  Alprazolam 1 Mg Tabs (Alprazolam) .... Take 1 tablet by mouth every 8 hours as needed Prescriptions: ALPRAZOLAM 1 MG TABS (ALPRAZOLAM) Take 1 tablet by mouth every 8 hours as needed  #90 x 0   Entered and Authorized by:   Yisroel Ramming MD   Signed by:   Yisroel Ramming MD on 11/02/2009   Method used:   Print then Give to Patient   RxID:   [id number]

## 2010-06-13 NOTE — Miscellaneous (Signed)
Summary: HIPAA Restrictions  HIPAA Restrictions   Imported By: Florinda Marker 09/27/2009 15:40:42  _____________________________________________________________________  External Attachment:    Type:   Image     Comment:   External Document

## 2010-06-13 NOTE — Consult Note (Signed)
Summary: [facility]   Imported By: Florinda Marker 10/19/2009 14:55:42  _____________________________________________________________________  External Attachment:    Type:   Image     Comment:   External Document

## 2010-06-13 NOTE — Assessment & Plan Note (Signed)
Summary: allergic reaction /dde   CC:  pt. c/o Alprazolam causing nausea and dizziness.  History of Present Illness: Pt c/o anxiety and depression.  He does not think the xanax is working.  It is unclear if he is Bipolar or depressed.  He will make an appt at [facility] and with Alisa.  He is not suicidal or homicidal.  Preventive Screening-Counseling & Management  Alcohol-Tobacco     Alcohol drinks/day: occasional     Alcohol type: wine     Smoking Status: current     Packs/Day: <0.25     Year Started: 2005  Caffeine-Diet-Exercise     Caffeine use/day: soda 5 per day     Does Patient Exercise: no  Safety-Violence-Falls     Seat Belt Use: yes      Sexual History:  n/a.        Drug Use:  never.    Comments: pt. declined condoms   Current Allergies (reviewed today): No known allergies  Past History:  Past Medical History: Last updated: 10/17/2009 Anxiety Depression  Review of Systems  The patient denies anorexia, fever, and weight loss.    Vital Signs:  Patient profile:   35 year old male Height:      73 inches (185.42 cm) Weight:      241.8 pounds (109.91 kg) BMI:     32.02 Temp:     98.4 degrees F (36.89 degrees C) oral Pulse rate:   64 / minute BP sitting:   141 / 92  (right arm)  Vitals Entered By: Wendall Mola CMA Duncan Dull) (December 13, 2009 3:28 PM) CC: pt. c/o Alprazolam causing nausea and dizziness Is Patient Diabetic? No Pain Assessment Patient in pain? no      Nutritional Status BMI of > 30 = obese Nutritional Status Detail appetite "low"  Does patient need assistance? Functional Status Self care Ambulation Normal   Physical Exam  General:  alert, well-developed, well-nourished, and well-hydrated.   Head:  normocephalic and atraumatic.   Mouth:  pharynx pink and moist.   Lungs:  normal breath sounds.   Psych:  Oriented X3, labile affect, and moderately anxious.     Impression & Recommendations:  Problem # 1:  ANXIETY  (ICD-300.00) will switch to ativan Pt needs appt with Psych and Alisa ASAP. The following medications were removed from the medication list:    Alprazolam 1 Mg Tabs (Alprazolam) .Marland Kitchen... Take 1 tablet by mouth every 8 hours as needed His updated medication list for this problem includes:    Ativan 1 Mg Tabs (Lorazepam) .Marland Kitchen... Take 1 tablet by mouth every 8 hours as needed  Orders: Est. Patient Level III (16109)  Medications Added to Medication List This Visit: 1)  Ativan 1 Mg Tabs (Lorazepam) .... Take 1 tablet by mouth every 8 hours as needed Prescriptions: ATIVAN 1 MG TABS (LORAZEPAM) Take 1 tablet by mouth every 8 hours as needed  #60 x 0   Entered and Authorized by:   Yisroel Ramming MD   Signed by:   Yisroel Ramming MD on 12/13/2009   Method used:   Print then Give to Patient   RxID:   [id number]

## 2010-06-13 NOTE — Assessment & Plan Note (Signed)
Summary: ER/FU/START MED?/VS   CC:  ER followup respirtory infection.  History of Present Illness: Pt went to the ED and was diagnosed with bronchitis (x-ray negative) and was given Rx for z-pack which he could not afford so he never got it.  He is still coughing and feels drained.  No fever. Pt has questions regarding treatment.  Preventive Screening-Counseling & Management  Alcohol-Tobacco     Alcohol drinks/day: occasional     Alcohol type: wine     Smoking Status: current     Packs/Day: <0.25     Year Started: 2005  Caffeine-Diet-Exercise     Caffeine use/day: soda 5 per day     Does Patient Exercise: no  Safety-Violence-Falls     Seat Belt Use: yes      Sexual History:  n/a.        Drug Use:  never.    Comments: pt. given condoms   Updated Prior Medication List: ALPRAZOLAM 1 MG TABS (ALPRAZOLAM) Take 1 tablet by mouth every 8 hours as needed  Current Allergies (reviewed today): No known allergies  Past History:  Past Medical History: Last updated: 10/17/2009 Anxiety Depression  Review of Systems  The patient denies anorexia, fever, weight loss, dyspnea on exertion, and hemoptysis.    Vital Signs:  Patient profile:   35 year old male Height:      73 inches (185.42 cm) Weight:      238.8 pounds (108.55 kg) BMI:     31.62 Temp:     98.6 degrees F (37.00 degrees C) oral Pulse rate:   71 / minute BP sitting:   142 / 87  (right arm)  Vitals Entered By: Wendall Mola CMA Duncan Dull) (December 01, 2009 3:40 PM) CC: ER followup respirtory infection Is Patient Diabetic? No Pain Assessment Patient in pain? no      Nutritional Status BMI of > 30 = obese Nutritional Status Detail appetite "normal"  Does patient need assistance? Functional Status Self care Ambulation Normal   Physical Exam  General:  alert, well-developed, well-nourished, and well-hydrated.   Head:  normocephalic and atraumatic.   Mouth:  pharynx pink and moist.   Lungs:  normal  breath sounds.     Impression & Recommendations:  Problem # 1:  ACUTE BRONCHITIS (ICD-466.0) will give pt a z-pack  Problem # 2:  HIV INFECTION (ICD-042) discussed the pros and cons of treatment. I discussed treatment options. Pt will hold off for now and repeat labs in about a month. Diagnostics Reviewed:  HIV: HIV positive - not AIDS (10/17/2009)   HIV-Western blot: Positive (09/27/2009)   CD4: 1150 (09/28/2009)   WBC: 6.3 (09/27/2009)   Hgb: 15.4 (09/27/2009)   HCT: 44.9 (09/27/2009)   Platelets: 218 (09/27/2009) HIV genotype: See Comment (09/27/2009)   HIV-1 RNA: 2550 (09/27/2009)   HBSAg: NEG (09/27/2009)  Problem # 3:  ANXIETY (ICD-300.00) in counseling xanax prn His updated medication list for this problem includes:    Alprazolam 1 Mg Tabs (Alprazolam) .Marland Kitchen... Take 1 tablet by mouth every 8 hours as needed  Patient Instructions: 1)  FOLLOW-UP AS SCHEDULED  sample of Zithromax (zpak) given per [facility] Sample Given, Lot #: Zithromax 250mg  #6 [id number] Expiration Date:Jun 13 Patient has been instructed regarding the correct time, dose and frequency of taking this med, including desired effects and most common side effects. Paulo Fruit  BS,CPht II,MPH  December 01, 2009 4:17 PM  Appended Document: ER/FU/START MED?/VS    Clinical Lists Changes  Orders: Added  new Service order of Est. Patient Level III (16109) - Signed
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
## 2010-06-13 NOTE — Miscellaneous (Signed)
Summary: Orders Update/labs   Clinical Lists Changes  Problems: Added new problem of HIV INFECTION (ICD-042) Orders: Added new Test order of T-Comprehensive Metabolic Panel [id number]) - Signed Added new Test order of T-CBC w/Diff [phone]) - Signed Added new Test order of T-CD4SP [facility]) (CD4SP) - Signed Added new Test order of T-Chlamydia  Probe, urine [phone]) - Signed Added new Test order of T-GC Probe, urine [phone]) - Signed Added new Test order of T-Hepatitis B Surface Antigen [id number]) - Signed Added new Test order of T-Hepatitis B Surface Antibody [phone]) - Signed Added new Test order of T-Hepatitis B Core Antibody ([id number]) - Signed Added new Test order of T-Hepatitis C Antibody ([id number]) - Signed Added new Test order of T-HIV Antibody  (Reflex) ([id number]) - Signed Added new Test order of T-HIV Ab Confirmatory Test/Western Blot ([id number]) - Signed Added new Test order of T-Lipid Profile ([id number]) - Signed Added new Test order of T-RPR (Syphilis) ([id number]) - Signed Added new Test order of T-Urinalysis ([id number]) - Signed Added new Test order of T-HIV1 Quant rflx Ultra or Genotype ([id number]) - Signed Added new Test order of T-Hepatitis A Antibody ([id number]) - Signed

## 2010-06-13 NOTE — Miscellaneous (Signed)
Summary: RW update  Clinical Lists Changes  Observations: Added new observation of RWPARTICIP: Yes (02/02/2010 9:27)

## 2010-06-13 NOTE — Progress Notes (Signed)
Summary: Pt. needing help with RX  Phone Note Call from Patient   Caller: Patient Call For: nurse Reason for Call: Talk to Nurse Summary of Call: Pt. called because he had been to the ER for cold symptoms and given a prescription for Z-pack and could not afford to pick up. Pt. does not have ADAP, he is established with [facility] and they have offered to help him with this RX. Initial call taken by: Wendall Mola CMA Duncan Dull),  November 21, 2009 9:55 AM

## 2010-06-13 NOTE — Consult Note (Signed)
Summary: New Pt Referral  New Pt Referral   Imported By: Florinda Marker 12/08/2009 15:34:19  _____________________________________________________________________  External Attachment:    Type:   Image     Comment:   External Document

## 2010-06-15 NOTE — Miscellaneous (Signed)
Summary: [facility] Counsel[facility]  Clinical Lists Changes Pt was into the clinic today and was very anxious. [facility] assisted pt, at the request of Dr. Philipp Deputy, with his medication this date. [facility] (through [facility] funds) was able to supply pt with a three day supply of his Ativan. Pt was instructed to follow up with his case manager at [facility] for further assistance.

## 2010-06-15 NOTE — Miscellaneous (Signed)
  Clinical Lists Changes 

## 2010-06-15 NOTE — Assessment & Plan Note (Signed)
Summary: F/U [MKJ]   CC:  follow-up visit, lab results, c/o anxiety, goes to [facility], and and fatigue.  History of Present Illness: Pt her with complaints for axiety,  He goes to [facility] but I have been the one to fill his antivan.  He would like a refill.  Preventive Screening-Counseling & Management  Alcohol-Tobacco     Alcohol drinks/day: occasional     Alcohol type: wine     Smoking Status: current     Packs/Day: <0.25     Year Started: 2005  Caffeine-Diet-Exercise     Caffeine use/day: soda 5 per day     Does Patient Exercise: yes     Type of exercise: walking  Safety-Violence-Falls     Seat Belt Use: yes      Sexual History:  n/a.        Drug Use:  never.    Comments: pt. declined condoms   Updated Prior Medication List: ATIVAN 1 MG TABS (LORAZEPAM) Take 1 tablet by mouth every 8 hours as needed  Current Allergies (reviewed today): ! AMOXICILLIN Past History:  Past Medical History: Last updated: 10/17/2009 Anxiety Depression  Vital Signs:  Patient profile:   35 year old male Height:      73 inches (185.42 cm) Weight:      231.0 pounds (105.00 kg) BMI:     30.59 Temp:     98.2 degrees F (36.78 degrees C) oral Pulse rate:   81 / minute BP sitting:   127 / 82  (right arm)  Vitals Entered By: Wendall Mola CMA Duncan Dull) (May 02, 2010 2:46 PM) CC: follow-up visit, lab results, c/o anxiety, goes to [facility], and fatigue Is Patient Diabetic? No Pain Assessment Patient in pain? no      Nutritional Status BMI of > 30 = obese Nutritional Status Detail appetite "on and off"  Have you ever been in a relationship where you felt threatened, hurt or afraid?No   Does patient need assistance? Functional Status Self care Ambulation Normal    Impression & Recommendations:  Problem # 1:  HIV INFECTION (ICD-042) not on meds check labs Diagnostics Reviewed:  HIV: HIV positive - not AIDS (10/17/2009)   HIV-Western blot:  Positive (09/27/2009)   CD4: 1050 (03/10/2010)   WBC: 7.6 (03/09/2010)   Hgb: 15.5 (03/09/2010)   HCT: 45.1 (03/09/2010)   Platelets: 204 (03/09/2010) HIV genotype: See Comment (09/27/2009)   HIV-1 RNA: 20400 (03/09/2010)   HBSAg: NEG (09/27/2009)  Orders: Est. Patient Level III (99213)Future Orders: T-CD4SP ([facility]) (CD4SP) ... 10/29/2010 T-HIV Viral Load [phone]) ... 10/29/2010 T-Comprehensive Metabolic Panel [id number]) ... 10/29/2010 T-CBC w/Diff ([id number]) ... 10/29/2010  Problem # 2:  ANXIETY (ICD-300.00) refill ativan His updated medication list for this problem includes:    Ativan 1 Mg Tabs (Lorazepam) .Marland Kitchen... Take 1 tablet by mouth every 8 hours as needed  Other Orders: Influenza Vaccine NON MCR (01751) Pneumococcal Vaccine (02585) Admin 1st Vaccine (27782)  Patient Instructions: 1)  Please schedule a follow-up appointment in 6 months, 2 weeks after labs.  Prescriptions: ATIVAN 1 MG TABS (LORAZEPAM) Take 1 tablet by mouth every 8 hours as needed  #9 x 0   Entered and Authorized by:   Yisroel Ramming MD   Signed by:   Yisroel Ramming MD on 05/02/2010   Method used:   Print then Give to Patient   RxID:   [id number] ATIVAN 1 MG TABS (LORAZEPAM) Take 1 tablet by mouth every 8 hours as needed  #  60 x 0   Entered and Authorized by:   Yisroel Ramming MD   Signed by:   Yisroel Ramming MD on 05/02/2010   Method used:   Print then Give to Patient   RxID:   [id number]      Immunizations Administered:  Influenza Vaccine # 1:    Vaccine Type: Fluvax Non-MCR    Site: right deltoid    Mfr: Novartis    Dose: 0.5 ml    Route: IM    Given by: Wendall Mola CMA ( AAMA)    Exp. Date: 08/13/2010    Lot #: [id number]    VIS given: 12/06/09 version given May 02, 2010.  Pneumonia Vaccine:    Vaccine Type: Pneumovax    Site: left deltoid    Mfr: Merck    Dose: 0.5 ml    Route: IM    Given by: Wendall Mola CMA ( AAMA)    Exp. Date: 09/21/2011     Lot #: [id number]    VIS given: 04/18/09 version given May 02, 2010.  Flu Vaccine Consent Questions:    Do you have a history of severe allergic reactions to this vaccine? no    Any prior history of allergic reactions to egg and/or gelatin? no    Do you have a sensitivity to the preservative Thimersol? no    Do you have a past history of Guillan-Barre Syndrome? no    Do you currently have an acute febrile illness? no    Have you ever had a severe reaction to latex? no    Vaccine information given and explained to patient? yes

## 2010-06-15 NOTE — Miscellaneous (Signed)
Summary: RW Update  Clinical Lists Changes  Observations: Added new observation of PAYOR: No Insurance (05/18/2010 12:09) Added new observation of RACE: African American (05/18/2010 12:09)

## 2010-06-20 ENCOUNTER — Ambulatory Visit: Payer: Self-pay | Admitting: Adult Health

## 2010-06-22 ENCOUNTER — Encounter (INDEPENDENT_AMBULATORY_CARE_PROVIDER_SITE_OTHER): Payer: Self-pay | Admitting: *Deleted

## 2010-06-22 ENCOUNTER — Encounter: Payer: Self-pay | Admitting: Adult Health

## 2010-06-23 ENCOUNTER — Ambulatory Visit (INDEPENDENT_AMBULATORY_CARE_PROVIDER_SITE_OTHER): Payer: Self-pay | Admitting: Adult Health

## 2010-06-23 ENCOUNTER — Encounter: Payer: Self-pay | Admitting: Adult Health

## 2010-06-23 DIAGNOSIS — B20 Human immunodeficiency virus [HIV] disease: Secondary | ICD-10-CM

## 2010-06-23 DIAGNOSIS — J069 Acute upper respiratory infection, unspecified: Secondary | ICD-10-CM | POA: Insufficient documentation

## 2010-06-23 DIAGNOSIS — F411 Generalized anxiety disorder: Secondary | ICD-10-CM

## 2010-06-26 ENCOUNTER — Encounter (INDEPENDENT_AMBULATORY_CARE_PROVIDER_SITE_OTHER): Payer: Self-pay | Admitting: *Deleted

## 2010-06-29 NOTE — Miscellaneous (Signed)
  Clinical Lists Changes  Observations: Added new observation of LATINO/HISP: No (06/22/2010 14:17)

## 2010-06-30 ENCOUNTER — Encounter (INDEPENDENT_AMBULATORY_CARE_PROVIDER_SITE_OTHER): Payer: Self-pay | Admitting: *Deleted

## 2010-06-30 ENCOUNTER — Other Ambulatory Visit (INDEPENDENT_AMBULATORY_CARE_PROVIDER_SITE_OTHER): Payer: Self-pay

## 2010-06-30 ENCOUNTER — Other Ambulatory Visit: Payer: Self-pay | Admitting: Adult Health

## 2010-06-30 ENCOUNTER — Encounter: Payer: Self-pay | Admitting: Adult Health

## 2010-06-30 DIAGNOSIS — B20 Human immunodeficiency virus [HIV] disease: Secondary | ICD-10-CM

## 2010-06-30 LAB — T-HELPER CELL (CD4) - (RCID CLINIC ONLY)
CD4 % Helper T Cell: 31 % — ABNORMAL LOW (ref 33–55)
CD4 T Cell Abs: 810 uL (ref 400–2700)

## 2010-06-30 LAB — CONVERTED CEMR LAB
ALT: 14 units/L (ref 0–53)
AST: 15 units/L (ref 0–37)
Albumin: 4.6 g/dL (ref 3.5–5.2)
Alkaline Phosphatase: 100 units/L (ref 39–117)
BUN: 10 mg/dL (ref 6–23)
Basophils Absolute: 0 10*3/uL (ref 0.0–0.1)
Basophils Relative: 0 % (ref 0–1)
CO2: 28 meq/L (ref 19–32)
Calcium: 9.1 mg/dL (ref 8.4–10.5)
Chloride: 105 meq/L (ref 96–112)
Creatinine, Ser: 1.06 mg/dL (ref 0.40–1.50)
Eosinophils Absolute: 0 10*3/uL (ref 0.0–0.7)
Eosinophils Relative: 1 % (ref 0–5)
Glucose, Bld: 86 mg/dL (ref 70–99)
HCT: 45.5 % (ref 39.0–52.0)
HIV 1 RNA Quant: 2720 copies/mL — ABNORMAL HIGH (ref <20)
HIV-1 RNA Quant, Log: 3.43 — ABNORMAL HIGH (ref <1.30)
Hemoglobin: 15.4 g/dL (ref 13.0–17.0)
Lymphocytes Relative: 50 % — ABNORMAL HIGH (ref 12–46)
Lymphs Abs: 2.4 10*3/uL (ref 0.7–4.0)
MCHC: 33.8 g/dL (ref 30.0–36.0)
MCV: 91.4 fL (ref 78.0–100.0)
Monocytes Absolute: 0.5 10*3/uL (ref 0.1–1.0)
Monocytes Relative: 10 % (ref 3–12)
Neutro Abs: 1.9 10*3/uL (ref 1.7–7.7)
Neutrophils Relative %: 40 % — ABNORMAL LOW (ref 43–77)
Platelets: 193 10*3/uL (ref 150–400)
Potassium: 4.8 meq/L (ref 3.5–5.3)
RBC: 4.98 M/uL (ref 4.22–5.81)
RDW: 13.4 % (ref 11.5–15.5)
Sodium: 141 meq/L (ref 135–145)
Total Bilirubin: 0.4 mg/dL (ref 0.3–1.2)
Total Protein: 7.3 g/dL (ref 6.0–8.3)
WBC: 4.9 10*3/uL (ref 4.0–10.5)

## 2010-07-03 ENCOUNTER — Encounter (INDEPENDENT_AMBULATORY_CARE_PROVIDER_SITE_OTHER): Payer: Self-pay | Admitting: *Deleted

## 2010-07-05 NOTE — Miscellaneous (Signed)
Summary: RW Financial Update  Clinical Lists Changes  Observations: Added new observation of RWTITLE: B (06/30/2010 10:55) Added new observation of AIDSDAP: No-not on hiv meds  (06/30/2010 10:55) Added new observation of PCTFPL: 0  (06/30/2010 10:55) Added new observation of INCOMESOURCE: none  (06/30/2010 10:55) Added new observation of HOUSEINCOME: 0  (06/30/2010 10:55) Added new observation of #CHILD<18 IN: No  (06/30/2010 10:55) Added new observation of FAMILYSIZE: 1  (06/30/2010 10:55) Added new observation of HOUSING: Temporary  (06/30/2010 10:55) Added new observation of FINASSESSDT: 06/30/2010  (06/30/2010 10:55) Added new observation of YEARLYEXPEN: 0  (06/30/2010 10:55)

## 2010-07-05 NOTE — Miscellaneous (Signed)
  Clinical Lists Changes  Observations: Added new observation of PATNTCOUNTY: [place] (06/26/2010 16:05)

## 2010-07-11 NOTE — Miscellaneous (Signed)
  Clinical Lists Changes  Observations: Added new observation of HIV RISK BEH: MSM (07/03/2010 11:21)

## 2010-07-11 NOTE — Assessment & Plan Note (Signed)
Summary: SINUS   Vital Signs:  Patient profile:   34 year old male Height:      73 inches Weight:      231 pounds BMI:     30.59 O2 Sat:      97 % on Room air Temp:     98 degrees F oral Pulse rate:   84 / minute BP sitting:   128 / 83  (right arm)  Vitals Entered By: Alesia Morin CMA (June 23, 2010 2:37 PM)  O2 Flow:  Room air CC: pt in for cold symptoms he ahs been coughing up green mucus for over a week. He does have chest tightness and he suffers from anxiety Is Patient Diabetic? No Pain Assessment Patient in pain? no      Nutritional Status BMI of > 30 = obese Nutritional Status Detail appetite "good"  Have you ever been in a relationship where you felt threatened, hurt or afraid?No   Does patient need assistance? Functional Status Self care Ambulation Normal Comments not on HIV meds   CC:  pt in for cold symptoms he ahs been coughing up green mucus for over a week. He does have chest tightness and he suffers from anxiety.  History of Present Illness: Sinus congestion with pressure, head fullness, cough with greenish sputum x 10 days.  Denies fever, chills, or sweats.  No pleuritic CP  Also c/o anxiety episodes increasing over the past few weeks.  Currently on Celexa and klonipin.  Preventive Screening-Counseling & Management  Alcohol-Tobacco     Alcohol drinks/day: occasional     Alcohol type: wine     Smoking Status: current     Packs/Day: <0.25     Year Started: 2005  Caffeine-Diet-Exercise     Caffeine use/day: soda 5 per day     Does Patient Exercise: yes     Type of exercise: walking  Safety-Violence-Falls     Seat Belt Use: yes      Sexual History:  n/a.        Drug Use:  never.    Comments: pt declined condoms  Allergies: 1)  ! Amoxicillin  Past History:  Past Medical History: Anxiety Depression HIV disease PMH-FH-SH reviewed for relevance  Review of Systems       The patient complains of decreased hearing, prolonged cough,  and depression.  The patient denies anorexia, fever, weight loss, weight gain, vision loss, hoarseness, chest pain, syncope, dyspnea on exertion, peripheral edema, headaches, hemoptysis, abdominal pain, melena, hematochezia, severe indigestion/heartburn, hematuria, incontinence, genital sores, muscle weakness, suspicious skin lesions, transient blindness, difficulty walking, unusual weight change, abnormal bleeding, enlarged lymph nodes, angioedema, and testicular masses.         SEE HPI  Physical Exam  General:  alert, well-developed, well-nourished, and well-hydrated.   Head:  normocephalic and atraumatic.   Eyes:  vision grossly intact, pupils equal, pupils round, and pupils reactive to light.   Ears:  L & R TM bulging.   Nose:  no external erythema and nasal dischargemucosal pallor.   Mouth:  fair dentition and postnasal drip.   Neck:  supple, full ROM, and no masses.   Chest Wall:  no deformities, no tenderness, and no masses.   Lungs:  normal respiratory effort, no intercostal retractions, no accessory muscle use, normal breath sounds, and no wheezes.   Heart:  normal rate, regular rhythm, no murmur, no gallop, and no rub.   Abdomen:  soft, non-tender, and normal bowel sounds.   Msk:  normal ROM.   Extremities:  No clubbing, cyanosis, edema, or deformity noted with normal full range of motion of all joints.   Neurologic:  alert & oriented X3, cranial nerves II-XII intact, strength normal in all extremities, and gait normal.   Skin:  Intact without suspicious lesions or rashes Cervical Nodes:  Small soft, nontender LAD Psych:  Oriented X3, memory intact for recent and remote, normally interactive, good eye contact, and slightly anxious.     Impression & Recommendations:  Problem # 1:  URI (ICD-465.9) 10-day course Cipro 500mg  by mouth q12h, Guaifenesin, Ibuprofen, Cetirizine once daily x 2weeks.  Follow-up at next scheduled visit with labs before visit. His updated medication list  for this problem includes:    Guaifenesin-dm 100-10 Mg/58ml Syrp (Dextromethorphan-guaifenesin) .Marland KitchenMarland KitchenMarland KitchenMarland Kitchen 3 tsps by mouth every 4 hours as needed cough    Cetirizine Hcl 10 Mg Tabs (Cetirizine hcl) .Marland Kitchen... 1 tablet by mouth once daily x 2 weeks then as needed    Ibuprofen 600 Mg Tabs (Ibuprofen) .Marland Kitchen... 1 tablet by mouth every 4-6 hours as needed pain/fever.  Orders: Est. Patient Level IV (19147)  Problem # 2:  ANXIETY (ICD-300.00) Try clonazepam 0.5mg  - 1mg  by mouth two times a day , Citalopram 10mg  once daily  The following medications were removed from the medication list:    Ativan 1 Mg Tabs (Lorazepam) .Marland Kitchen... Take 1 tablet by mouth every 8 hours as needed His updated medication list for this problem includes:    Klonopin 1 Mg Tabs (Clonazepam) .Marland Kitchen... 1/2 tab twice a day as needed up to 1 tab twice a day    Celexa 20 Mg Tabs (Citalopram hydrobromide) .Marland Kitchen... 1/2 tab once a day  Orders: Est. Patient Level IV (82956)  Medications Added to Medication List This Visit: 1)  Klonopin 1 Mg Tabs (Clonazepam) .... 1/2 tab twice a day as needed up to 1 tab twice a day 2)  Celexa 20 Mg Tabs (Citalopram hydrobromide) .... 1/2 tab once a day 3)  Ciprofloxacin Hcl 500 Mg Tabs (Ciprofloxacin hcl) .Marland Kitchen.. 1 tablet by mouth every 12 hours x 10 days 4)  Guaifenesin-dm 100-10 Mg/66ml Syrp (Dextromethorphan-guaifenesin) .... 3 tsps by mouth every 4 hours as needed cough 5)  Cetirizine Hcl 10 Mg Tabs (Cetirizine hcl) .Marland Kitchen.. 1 tablet by mouth once daily x 2 weeks then as needed 6)  Ibuprofen 600 Mg Tabs (Ibuprofen) .Marland Kitchen.. 1 tablet by mouth every 4-6 hours as needed pain/fever.  Other Orders: Future Orders: T-CBC w/Diff ([id number]) ... 07/03/2010 T-CD4SP ([facility]) (CD4SP) ... 07/03/2010 T-Comprehensive Metabolic Panel [id number]) ... 07/03/2010 T-HIV Viral Load [id number]) ... 07/03/2010  Patient Instructions: 1)  Recommend increasing fluid intake for hydration for the next few days. 2)  Return to clinic in 10 days  for labs. 3)  Please schedule a follow-up appointment in 3 weeks. 4)  Follow-up with [facility] case manager. 5)  Cetirizine (Zyrtec) is over-the-counter (OTC) 6)  Robitussin DM and Mucinex DM are OTC (can use store brand versions. 7)  Ibuprofen 200mg  is OTC (can use 3 - 200mg  tablets in place of 1 600 mg tablet). Prescriptions: IBUPROFEN 600 MG TABS (IBUPROFEN) 1 tablet by mouth every 4-6 hours as needed pain/fever.  #50 x 0   Entered and Authorized by:   Talmadge Chad NP   Signed by:   Talmadge Chad NP on 06/23/2010   Method used:   Print then Give to Patient   RxID:   [id number] CETIRIZINE HCL 10 MG TABS (CETIRIZINE HCL) 1 tablet by  mouth once daily x 2 weeks then as needed  #30 x 0   Entered and Authorized by:   Talmadge Chad NP   Signed by:   Talmadge Chad NP on 06/23/2010   Method used:   Print then Give to Patient   RxID:   [id number] GUAIFENESIN-DM 100-10 MG/5ML SYRP (DEXTROMETHORPHAN-GUAIFENESIN) 3 tsps by mouth every 4 hours as needed cough  #8 oz. x 0   Entered and Authorized by:   Talmadge Chad NP   Signed by:   Talmadge Chad NP on 06/23/2010   Method used:   Print then Give to Patient   RxID:   [id number] CIPROFLOXACIN HCL 500 MG TABS (CIPROFLOXACIN HCL) 1 tablet by mouth every 12 hours x 10 days  #20 x 0   Entered and Authorized by:   Talmadge Chad NP   Signed by:   Talmadge Chad NP on 06/23/2010   Method used:   Print then Give to Patient   RxID:   [id number]    Orders Added: 1)  Est. Patient Level IV [01027] 2)  T-CBC w/Diff [[id number]] 3)  T-CD4SP ([facility]) [CD4SP] 4)  T-Comprehensive Metabolic Panel [[id number]] 5)  T-HIV Viral Load [id number]

## 2010-07-17 ENCOUNTER — Ambulatory Visit (INDEPENDENT_AMBULATORY_CARE_PROVIDER_SITE_OTHER): Payer: Self-pay | Admitting: Adult Health

## 2010-07-17 ENCOUNTER — Encounter: Payer: Self-pay | Admitting: Adult Health

## 2010-07-17 DIAGNOSIS — B20 Human immunodeficiency virus [HIV] disease: Secondary | ICD-10-CM

## 2010-07-17 DIAGNOSIS — F329 Major depressive disorder, single episode, unspecified: Secondary | ICD-10-CM

## 2010-07-17 DIAGNOSIS — F411 Generalized anxiety disorder: Secondary | ICD-10-CM

## 2010-07-18 ENCOUNTER — Encounter (INDEPENDENT_AMBULATORY_CARE_PROVIDER_SITE_OTHER): Payer: Self-pay | Admitting: *Deleted

## 2010-07-20 NOTE — Miscellaneous (Signed)
Summary: [place] [facility]  [facility]   Imported By: Florinda Marker 07/11/2010 15:29:18  _____________________________________________________________________  External Attachment:    Type:   Image     Comment:   External Document

## 2010-07-24 LAB — COMPREHENSIVE METABOLIC PANEL
ALT: 13 U/L (ref 0–53)
AST: 17 U/L (ref 0–37)
Alkaline Phosphatase: 94 U/L (ref 39–117)
CO2: 28 mEq/L (ref 19–32)
Chloride: 103 mEq/L (ref 96–112)
GFR calc Af Amer: >60 mL/min (ref >60)
GFR calc non Af Amer: >60 mL/min (ref >60)
Glucose, Bld: 75 mg/dL (ref 70–99)
Sodium: 138 mEq/L (ref 135–145)
Total Bilirubin: 0.7 mg/dL (ref 0.3–1.2)

## 2010-07-24 LAB — RAPID URINE DRUG SCREEN, HOSP PERFORMED
Amphetamines: NOT DETECTED
Barbiturates: NOT DETECTED
Benzodiazepines: POSITIVE — AB
Cocaine: NOT DETECTED
Opiates: NOT DETECTED
Tetrahydrocannabinol: NOT DETECTED

## 2010-07-24 LAB — ACETAMINOPHEN LEVEL

## 2010-07-24 LAB — CBC
HCT: 44.7 % (ref 39.0–52.0)
Hemoglobin: 15.2 g/dL (ref 13.0–17.0)
MCHC: 34 g/dL (ref 30.0–36.0)
RBC: 4.83 MIL/uL (ref 4.22–5.81)

## 2010-07-24 LAB — ETHANOL

## 2010-07-24 LAB — SALICYLATE LEVEL: Salicylate Lvl: <4 mg/dL (ref 2.8–20.0)

## 2010-07-25 LAB — POCT I-STAT, CHEM 8
Calcium, Ion: 1.16 mmol/L (ref 1.12–1.32)
Glucose, Bld: 85 mg/dL (ref 70–99)
HCT: 47 % (ref 39.0–52.0)
Hemoglobin: 16 g/dL (ref 13.0–17.0)
TCO2: 28 mmol/L (ref 0–100)

## 2010-07-25 LAB — POCT CARDIAC MARKERS: Troponin i, poc: <0.05 ng/mL (ref 0.00–0.09)

## 2010-07-25 NOTE — Assessment & Plan Note (Signed)
Summary: 3WK F/U/VS   Vital Signs:  Patient profile:   35 year old male Height:      73 inches Weight:      222.12 pounds BMI:     29.41 Temp:     98.3 degrees F oral Pulse rate:   85 / minute BP sitting:   118 / 76  (left arm)  Vitals Entered By: Alesia Morin CMA (July 17, 2010 2:50 PM) CC: follow-up visit for sick visit. Says that he has been having panic attacks he visited his mental health docotr and has some med changes Is Patient Diabetic? No Pain Assessment Patient in pain? no      Nutritional Status BMI of 25 - 29 = overweight Nutritional Status Detail appetite "good"  Have you ever been in a relationship where you felt threatened, hurt or afraid?No   Does patient need assistance? Functional Status Self care Ambulation Normal   CC:  follow-up visit for sick visit. Says that he has been having panic attacks he visited his mental health docotr and has some med changes.  History of Present Illness: URI symptoms improved.  Had episodes of panic attacks, was seen by mental health who changed his antidepressant therapy to Lexapro and clonazepam increased to 1 mg three times a day. Has follow-up scheduled with them for this week.  Still has multiple issues regarding his diagnosis, his current living issues, and unemployment.  Is slated for mental health counselling also this week.  Preventive Screening-Counseling & Management  Alcohol-Tobacco     Alcohol drinks/day: occasional     Alcohol type: wine     Smoking Status: current     Packs/Day: <0.25     Year Started: 2005  Caffeine-Diet-Exercise     Caffeine use/day: soda 5 per day     Does Patient Exercise: yes     Type of exercise: walking  Safety-Violence-Falls     Seat Belt Use: yes      Sexual History:  n/a.        Drug Use:  never.        Blood Transfusions:  no.        Travel History:  no.    Comments: pt declined condoms  Allergies: 1)  ! Amoxicillin  Social History: Blood Transfusions:   no Travel History:  no  Review of Systems  The patient denies anorexia, fever, weight loss, weight gain, vision loss, decreased hearing, hoarseness, chest pain, syncope, dyspnea on exertion, peripheral edema, prolonged cough, headaches, hemoptysis, abdominal pain, melena, hematochezia, severe indigestion/heartburn, hematuria, incontinence, genital sores, muscle weakness, suspicious skin lesions, transient blindness, difficulty walking, depression, unusual weight change, abnormal bleeding, enlarged lymph nodes, angioedema, and testicular masses.    Physical Exam  General:  alert, well-developed, well-nourished, and well-hydrated.   Head:  normocephalic and atraumatic.   Eyes:  vision grossly intact, pupils equal, pupils round, and pupils reactive to light.   Ears:    R ear normal and L ear normal.   Mouth:    pharynx pink and moist and fair dentition.   Neck:  supple, full ROM, and no masses.   Lungs:  normal respiratory effort and normal breath sounds.   Heart:  normal rate, regular rhythm, no murmur, no gallop, and no rub.   Abdomen:  soft, non-tender, and normal bowel sounds.   Msk:  normal ROM.   Neurologic:  alert & oriented X3, cranial nerves II-XII intact, strength normal in all extremities, and gait normal.   Skin:  no rashes and no suspicious lesions.   Psych:  Oriented X3, memory intact for recent and remote, normally interactive, good eye contact, not anxious appearing, and not depressed appearing.     Impression & Recommendations:  Problem # 1:  HIV DISEASE (ICD-042)  CD4 810 @ 31% with VL 2750 copies/ml.  ARV-naive.  Discussed in detail the timeline when we may consider treatment.  However, after extensive discussion (>20 min) we agreed treatment can be re-addressed at a future date when he has had a chance to further address his emotional and social issues.  Verbally acknowledged this and agreed to re-address this on next clinic follow-up.  Labs in 10 weeks and f/u in 3  months.  Future Orders: T-CD4SP ([facility]) (CD4SP) ... 09/25/2010 T-Comprehensive Metabolic Panel [id number]) ... 09/25/2010 T-HIV Viral Load [id number]) ... 09/25/2010  Problem # 2:  DEPRESSION (ICD-311)  Continue follow-up with Mental health for medication management and counselling. The following medications were removed from the medication list:    Celexa 20 Mg Tabs (Citalopram hydrobromide) .Marland Kitchen... 1/2 tab once a day His updated medication list for this problem includes:     Lexapro 20 Mg Tabs (Escitalopram oxalate) ..... Once daily    Clonazepam 1 Mg Tabs (Clonazepam) .Marland Kitchen... 1 tab 3x daily  Orders: Est. Patient Level IV (10272)  Medications Added to Medication List This Visit: 1)  Lexapro 20 Mg Tabs (Escitalopram oxalate) .... Once daily 2)  Clonazepam 1 Mg Tabs (Clonazepam) .Marland Kitchen.. 1 tab 3x daily  Other Orders: Future Orders: T-CBC w/Diff ([id number]) ... 09/25/2010   Orders Added: 1)  Est. Patient Level IV [99214] 2)  T-CBC w/Diff [[id number]] 3)  T-CD4SP [facility]) [CD4SP] 4)  T-Comprehensive Metabolic Panel [[id number]] 5)  T-HIV Viral Load [phone]

## 2010-07-25 NOTE — Miscellaneous (Signed)
  Clinical Lists Changes  Observations: Added new observation of GENDER: Male (07/18/2010 14:20)

## 2010-07-27 LAB — BASIC METABOLIC PANEL
BUN: 10 mg/dL (ref 6–23)
CO2: 26 mEq/L (ref 19–32)
Calcium: 9.1 mg/dL (ref 8.4–10.5)
Glucose, Bld: 117 mg/dL — ABNORMAL HIGH (ref 70–99)
Sodium: 138 mEq/L (ref 135–145)

## 2010-07-27 LAB — DIFFERENTIAL
Basophils Relative: 0 % (ref 0–1)
Eosinophils Absolute: 0.1 10*3/uL (ref 0.0–0.7)
Monocytes Absolute: 0.3 10*3/uL (ref 0.1–1.0)
Monocytes Relative: 5 % (ref 3–12)
Neutrophils Relative %: 47 % (ref 43–77)

## 2010-07-27 LAB — RAPID URINE DRUG SCREEN, HOSP PERFORMED
Amphetamines: NOT DETECTED
Barbiturates: NOT DETECTED
Benzodiazepines: POSITIVE — AB
Cocaine: NOT DETECTED
Opiates: NOT DETECTED

## 2010-07-27 LAB — CBC
HCT: 44.2 % (ref 39.0–52.0)
Hemoglobin: 15.1 g/dL (ref 13.0–17.0)
MCH: 31.9 pg (ref 26.0–34.0)
MCHC: 34.2 g/dL (ref 30.0–36.0)
RDW: 14.1 % (ref 11.5–15.5)

## 2010-07-28 ENCOUNTER — Encounter: Payer: Self-pay | Admitting: Adult Health

## 2010-07-28 LAB — RAPID URINE DRUG SCREEN, HOSP PERFORMED
Barbiturates: NOT DETECTED
Benzodiazepines: POSITIVE — AB
Cocaine: NOT DETECTED
Opiates: NOT DETECTED

## 2010-07-28 LAB — CBC
HCT: 44.4 % (ref 39.0–52.0)
Hemoglobin: 14.7 g/dL (ref 13.0–17.0)
Platelets: 175 10*3/uL (ref 150–400)
RBC: 4.67 MIL/uL (ref 4.22–5.81)
RDW: 13.1 % (ref 11.5–15.5)
WBC: 5.4 10*3/uL (ref 4.0–10.5)
WBC: 6.5 10*3/uL (ref 4.0–10.5)

## 2010-07-28 LAB — DIFFERENTIAL
Basophils Absolute: 0 10*3/uL (ref 0.0–0.1)
Basophils Relative: 0 % (ref 0–1)
Lymphocytes Relative: 44 % (ref 12–46)
Lymphs Abs: 2.3 10*3/uL (ref 0.7–4.0)
Monocytes Relative: 7 % (ref 3–12)
Neutro Abs: 2.6 10*3/uL (ref 1.7–7.7)
Neutro Abs: 3.1 10*3/uL (ref 1.7–7.7)
Neutrophils Relative %: 49 % (ref 43–77)
Neutrophils Relative %: 49 % (ref 43–77)

## 2010-07-28 LAB — BASIC METABOLIC PANEL
BUN: 7 mg/dL (ref 6–23)
Creatinine, Ser: 1.12 mg/dL (ref 0.4–1.5)
GFR calc non Af Amer: >60 mL/min (ref >60)
Potassium: 3.9 mEq/L (ref 3.5–5.1)

## 2010-07-28 LAB — POCT I-STAT, CHEM 8
Chloride: 104 mEq/L (ref 96–112)
Creatinine, Ser: 1.1 mg/dL (ref 0.4–1.5)
Glucose, Bld: 95 mg/dL (ref 70–99)
HCT: 46 % (ref 39.0–52.0)
Potassium: 3.7 mEq/L (ref 3.5–5.1)

## 2010-07-28 LAB — COMPREHENSIVE METABOLIC PANEL
CO2: 26 mEq/L (ref 19–32)
Calcium: 9.1 mg/dL (ref 8.4–10.5)
Creatinine, Ser: 1.1 mg/dL (ref 0.4–1.5)
GFR calc non Af Amer: >60 mL/min (ref >60)
Glucose, Bld: 92 mg/dL (ref 70–99)

## 2010-07-28 LAB — VALPROIC ACID LEVEL: Valproic Acid Lvl: 86.3 ug/mL (ref 50.0–100.0)

## 2010-07-28 LAB — POCT CARDIAC MARKERS
CKMB, poc: <1 ng/mL — ABNORMAL LOW (ref 1.0–8.0)
Troponin i, poc: <0.05 ng/mL (ref 0.00–0.09)

## 2010-07-30 LAB — POCT I-STAT, CHEM 8
Calcium, Ion: 1.14 mmol/L (ref 1.12–1.32)
Creatinine, Ser: 1 mg/dL (ref 0.4–1.5)
Glucose, Bld: 99 mg/dL (ref 70–99)
HCT: 50 % (ref 39.0–52.0)
Hemoglobin: 17 g/dL (ref 13.0–17.0)
Potassium: 3.7 mEq/L (ref 3.5–5.1)
TCO2: 26 mmol/L (ref 0–100)

## 2010-07-30 LAB — BASIC METABOLIC PANEL
CO2: 32 mEq/L (ref 19–32)
Calcium: 9.4 mg/dL (ref 8.4–10.5)
Creatinine, Ser: 1.27 mg/dL (ref 0.4–1.5)
Glucose, Bld: 119 mg/dL — ABNORMAL HIGH (ref 70–99)

## 2010-07-30 LAB — DIFFERENTIAL
Basophils Relative: 1 % (ref 0–1)
Eosinophils Absolute: 0.2 10*3/uL (ref 0.0–0.7)
Eosinophils Relative: 2 % (ref 0–5)
Lymphs Abs: 2.6 10*3/uL (ref 0.7–4.0)
Monocytes Absolute: 0.8 10*3/uL (ref 0.1–1.0)
Monocytes Relative: 9 % (ref 3–12)
Neutrophils Relative %: 58 % (ref 43–77)

## 2010-07-30 LAB — CBC
HCT: 46.4 % (ref 39.0–52.0)
Hemoglobin: 15.5 g/dL (ref 13.0–17.0)
MCH: 31.3 pg (ref 26.0–34.0)
MCHC: 33.5 g/dL (ref 30.0–36.0)
MCV: 93.5 fL (ref 78.0–100.0)
RBC: 4.96 MIL/uL (ref 4.22–5.81)
WBC: 8.4 10*3/uL (ref 4.0–10.5)

## 2010-07-31 LAB — DIFFERENTIAL
Lymphs Abs: 3.1 10*3/uL (ref 0.7–4.0)
Monocytes Absolute: 0.5 10*3/uL (ref 0.1–1.0)
Monocytes Relative: 6 % (ref 3–12)
Neutro Abs: 3.5 10*3/uL (ref 1.7–7.7)
Neutrophils Relative %: 49 % (ref 43–77)

## 2010-07-31 LAB — T-HELPER CELL (CD4) - (RCID CLINIC ONLY): CD4 % Helper T Cell: 33 % (ref 33–55)

## 2010-07-31 LAB — COMPREHENSIVE METABOLIC PANEL
ALT: 36 U/L (ref 0–53)
Albumin: 4.1 g/dL (ref 3.5–5.2)
Calcium: 8.7 mg/dL (ref 8.4–10.5)
Glucose, Bld: 104 mg/dL — ABNORMAL HIGH (ref 70–99)
Potassium: 3.8 mEq/L (ref 3.5–5.1)
Sodium: 139 mEq/L (ref 135–145)
Total Protein: 7.4 g/dL (ref 6.0–8.3)

## 2010-07-31 LAB — CBC
Hemoglobin: 15.4 g/dL (ref 13.0–17.0)
MCHC: 32.9 g/dL (ref 30.0–36.0)
Platelets: 191 10*3/uL (ref 150–400)
RDW: 13.5 % (ref 11.5–15.5)

## 2010-08-16 LAB — BASIC METABOLIC PANEL
BUN: 7 mg/dL (ref 6–23)
CO2: 27 mEq/L (ref 19–32)
Calcium: 9 mg/dL (ref 8.4–10.5)
Calcium: 9.7 mg/dL (ref 8.4–10.5)
Creatinine, Ser: 1.12 mg/dL (ref 0.4–1.5)
GFR calc Af Amer: >60 mL/min (ref >60)
GFR calc non Af Amer: >60 mL/min (ref >60)
Glucose, Bld: 144 mg/dL — ABNORMAL HIGH (ref 70–99)
Glucose, Bld: 92 mg/dL (ref 70–99)
Sodium: 141 mEq/L (ref 135–145)

## 2010-08-16 LAB — CBC
Hemoglobin: 16.3 g/dL (ref 13.0–17.0)
MCHC: 33.9 g/dL (ref 30.0–36.0)
Platelets: 174 10*3/uL (ref 150–400)
RDW: 13.5 % (ref 11.5–15.5)
RDW: 13.8 % (ref 11.5–15.5)

## 2010-08-16 LAB — DIFFERENTIAL
Basophils Absolute: 0 10*3/uL (ref 0.0–0.1)
Lymphocytes Relative: 31 % (ref 12–46)
Monocytes Absolute: 0.5 10*3/uL (ref 0.1–1.0)
Neutro Abs: 4.6 10*3/uL (ref 1.7–7.7)

## 2010-08-16 LAB — RAPID URINE DRUG SCREEN, HOSP PERFORMED
Amphetamines: NOT DETECTED
Barbiturates: NOT DETECTED
Barbiturates: NOT DETECTED
Benzodiazepines: NOT DETECTED
Benzodiazepines: POSITIVE — AB
Cocaine: NOT DETECTED
Cocaine: NOT DETECTED

## 2010-08-16 LAB — POCT I-STAT, CHEM 8
Glucose, Bld: 87 mg/dL (ref 70–99)
HCT: 49 % (ref 39.0–52.0)
Hemoglobin: 16.7 g/dL (ref 13.0–17.0)
Potassium: 4.2 mEq/L (ref 3.5–5.1)
Sodium: 142 mEq/L (ref 135–145)

## 2010-08-16 LAB — ETHANOL: Alcohol, Ethyl (B): <5 mg/dL (ref 0–10)

## 2010-08-17 LAB — BASIC METABOLIC PANEL
BUN: 11 mg/dL (ref 6–23)
BUN: 6 mg/dL (ref 6–23)
BUN: 8 mg/dL (ref 6–23)
CO2: 26 mEq/L (ref 19–32)
CO2: 28 mEq/L (ref 19–32)
Calcium: 9.1 mg/dL (ref 8.4–10.5)
Calcium: 9.5 mg/dL (ref 8.4–10.5)
Chloride: 103 mEq/L (ref 96–112)
Chloride: 104 mEq/L (ref 96–112)
Creatinine, Ser: 1.02 mg/dL (ref 0.4–1.5)
Creatinine, Ser: 1.2 mg/dL (ref 0.4–1.5)
GFR calc Af Amer: >60 mL/min (ref >60)
GFR calc Af Amer: >60 mL/min (ref >60)
GFR calc non Af Amer: >60 mL/min (ref >60)
GFR calc non Af Amer: >60 mL/min (ref >60)
Glucose, Bld: 101 mg/dL — ABNORMAL HIGH (ref 70–99)
Potassium: 3.6 mEq/L (ref 3.5–5.1)
Potassium: 4.1 mEq/L (ref 3.5–5.1)
Sodium: 139 mEq/L (ref 135–145)
Sodium: 139 mEq/L (ref 135–145)

## 2010-08-17 LAB — DIFFERENTIAL
Basophils Absolute: 0.1 10*3/uL (ref 0.0–0.1)
Basophils Relative: 1 % (ref 0–1)
Basophils Relative: 0 % (ref 0–1)
Eosinophils Absolute: 0.1 10*3/uL (ref 0.0–0.7)
Eosinophils Absolute: 0 10*3/uL (ref 0.0–0.7)
Eosinophils Relative: 1 % (ref 0–5)
Lymphocytes Relative: 28 % (ref 12–46)
Lymphs Abs: 1.8 10*3/uL (ref 0.7–4.0)
Monocytes Absolute: 0.3 10*3/uL (ref 0.1–1.0)
Monocytes Relative: 5 % (ref 3–12)
Neutro Abs: 4.2 10*3/uL (ref 1.7–7.7)
Neutro Abs: 3.4 10*3/uL (ref 1.7–7.7)
Neutrophils Relative %: 65 % (ref 43–77)
Neutrophils Relative %: 61 % (ref 43–77)

## 2010-08-17 LAB — URINALYSIS, ROUTINE W REFLEX MICROSCOPIC
Bilirubin Urine: NEGATIVE
Glucose, UA: NEGATIVE mg/dL
Glucose, UA: NEGATIVE mg/dL
Glucose, UA: NEGATIVE mg/dL
Hgb urine dipstick: NEGATIVE
Hgb urine dipstick: NEGATIVE
Hgb urine dipstick: NEGATIVE
Ketones, ur: 15 mg/dL — AB
Ketones, ur: NEGATIVE mg/dL
Leukocytes, UA: NEGATIVE
Nitrite: NEGATIVE
Nitrite: NEGATIVE
Nitrite: NEGATIVE
Protein, ur: 30 mg/dL — AB
Protein, ur: NEGATIVE mg/dL
Protein, ur: NEGATIVE mg/dL
Specific Gravity, Urine: 1.008 (ref 1.005–1.030)
Specific Gravity, Urine: 1.027 (ref 1.005–1.030)
Specific Gravity, Urine: 1.03 (ref 1.005–1.030)
Urobilinogen, UA: 0.2 mg/dL (ref 0.0–1.0)
Urobilinogen, UA: 2 mg/dL — ABNORMAL HIGH (ref 0.0–1.0)
Urobilinogen, UA: 4 mg/dL — ABNORMAL HIGH (ref 0.0–1.0)
pH: 6 (ref 5.0–8.0)
pH: 7.5 (ref 5.0–8.0)

## 2010-08-17 LAB — CBC
HCT: 45.4 % (ref 39.0–52.0)
HCT: 46.3 % (ref 39.0–52.0)
MCHC: 34.2 g/dL (ref 30.0–36.0)
MCV: 95.6 fL (ref 78.0–100.0)
MCV: 94.4 fL (ref 78.0–100.0)
Platelets: 193 10*3/uL (ref 150–400)
Platelets: 196 10*3/uL (ref 150–400)
RBC: 4.75 MIL/uL (ref 4.22–5.81)
WBC: 6.5 10*3/uL (ref 4.0–10.5)
WBC: 5.5 10*3/uL (ref 4.0–10.5)
WBC: 5.9 10*3/uL (ref 4.0–10.5)

## 2010-08-17 LAB — POCT I-STAT, CHEM 8
BUN: 9 mg/dL (ref 6–23)
Calcium, Ion: 1.17 mmol/L (ref 1.12–1.32)
Chloride: 102 mEq/L (ref 96–112)
Creatinine, Ser: 1.1 mg/dL (ref 0.4–1.5)
Glucose, Bld: 105 mg/dL — ABNORMAL HIGH (ref 70–99)
HCT: 49 % (ref 39.0–52.0)
Hemoglobin: 16.7 g/dL (ref 13.0–17.0)
Potassium: 4.2 mEq/L (ref 3.5–5.1)
Sodium: 140 mEq/L (ref 135–145)
TCO2: 30 mmol/L (ref 0–100)

## 2010-08-17 LAB — POCT CARDIAC MARKERS
CKMB, poc: <1 ng/mL — ABNORMAL LOW (ref 1.0–8.0)
Myoglobin, poc: 47.6 ng/mL (ref 12–200)
Troponin i, poc: <0.05 ng/mL (ref 0.00–0.09)

## 2010-08-17 LAB — URINE MICROSCOPIC-ADD ON

## 2010-08-17 LAB — ETHANOL: Alcohol, Ethyl (B): <5 mg/dL (ref 0–10)

## 2010-08-17 LAB — ACETAMINOPHEN LEVEL: Acetaminophen (Tylenol), Serum: <10 ug/mL — ABNORMAL LOW (ref 10–30)

## 2010-08-17 LAB — GLUCOSE, CAPILLARY: Glucose-Capillary: 116 mg/dL — ABNORMAL HIGH (ref 70–99)

## 2010-08-19 LAB — CBC
HCT: 50.8 % (ref 39.0–52.0)
Hemoglobin: 14.9 g/dL (ref 13.0–17.0)
Hemoglobin: 15.9 g/dL (ref 13.0–17.0)
MCHC: 31.3 g/dL (ref 30.0–36.0)
MCHC: 33.4 g/dL (ref 30.0–36.0)
MCV: 95.5 fL (ref 78.0–100.0)
MCV: 94.1 fL (ref 78.0–100.0)
MCV: 94.5 fL (ref 78.0–100.0)
Platelets: 201 10*3/uL (ref 150–400)
RBC: 4.64 MIL/uL (ref 4.22–5.81)
RBC: 4.72 MIL/uL (ref 4.22–5.81)
RDW: 13.3 % (ref 11.5–15.5)
WBC: 6.6 10*3/uL (ref 4.0–10.5)
WBC: 4.6 10*3/uL (ref 4.0–10.5)

## 2010-08-19 LAB — ETHANOL: Alcohol, Ethyl (B): <5 mg/dL (ref 0–10)

## 2010-08-19 LAB — DIFFERENTIAL
Basophils Absolute: 0 10*3/uL (ref 0.0–0.1)
Basophils Absolute: 0 10*3/uL (ref 0.0–0.1)
Basophils Absolute: 0 K/uL (ref 0.0–0.1)
Basophils Relative: 0 % (ref 0–1)
Basophils Relative: 0 % (ref 0–1)
Basophils Relative: 1 % (ref 0–1)
Eosinophils Absolute: 0.1 10*3/uL (ref 0.0–0.7)
Eosinophils Absolute: 0 K/uL (ref 0.0–0.7)
Eosinophils Absolute: 0.1 10*3/uL (ref 0.0–0.7)
Eosinophils Relative: 2 % (ref 0–5)
Eosinophils Relative: 1 % (ref 0–5)
Eosinophils Relative: 1 % (ref 0–5)
Lymphocytes Relative: 35 % (ref 12–46)
Lymphs Abs: 1.8 10*3/uL (ref 0.7–4.0)
Lymphs Abs: 2.1 K/uL (ref 0.7–4.0)
Monocytes Absolute: 0.4 K/uL (ref 0.1–1.0)
Monocytes Relative: 7 % (ref 3–12)
Neutro Abs: 3.5 K/uL (ref 1.7–7.7)
Neutrophils Relative %: 48 % (ref 43–77)
Neutrophils Relative %: 57 % (ref 43–77)

## 2010-08-19 LAB — RAPID URINE DRUG SCREEN, HOSP PERFORMED
Amphetamines: NOT DETECTED
Barbiturates: NOT DETECTED
Barbiturates: NOT DETECTED
Benzodiazepines: POSITIVE — AB
Cocaine: NOT DETECTED
Cocaine: NOT DETECTED
Cocaine: NOT DETECTED
Opiates: NOT DETECTED
Opiates: NOT DETECTED
Opiates: NOT DETECTED
Tetrahydrocannabinol: NOT DETECTED
Tetrahydrocannabinol: NOT DETECTED

## 2010-08-19 LAB — COMPREHENSIVE METABOLIC PANEL
ALT: 61 U/L — ABNORMAL HIGH (ref 0–53)
ALT: 20 U/L (ref 0–53)
AST: 30 U/L (ref 0–37)
AST: 18 U/L (ref 0–37)
Alkaline Phosphatase: 117 U/L (ref 39–117)
Alkaline Phosphatase: 92 U/L (ref 39–117)
CO2: 29 mEq/L (ref 19–32)
CO2: 32 mEq/L (ref 19–32)
Calcium: 9 mg/dL (ref 8.4–10.5)
Chloride: 103 mEq/L (ref 96–112)
Chloride: 104 mEq/L (ref 96–112)
Creatinine, Ser: 1.08 mg/dL (ref 0.4–1.5)
GFR calc Af Amer: >60 mL/min (ref >60)
GFR calc non Af Amer: >60 mL/min (ref >60)
GFR calc non Af Amer: >60 mL/min (ref >60)
Glucose, Bld: 87 mg/dL (ref 70–99)
Sodium: 139 mEq/L (ref 135–145)
Sodium: 140 mEq/L (ref 135–145)
Total Bilirubin: 0.6 mg/dL (ref 0.3–1.2)
Total Bilirubin: 0.6 mg/dL (ref 0.3–1.2)

## 2010-08-19 LAB — URINALYSIS, ROUTINE W REFLEX MICROSCOPIC
Ketones, ur: NEGATIVE mg/dL
Nitrite: NEGATIVE
Nitrite: NEGATIVE
Protein, ur: NEGATIVE mg/dL
Specific Gravity, Urine: 1.021 (ref 1.005–1.030)
Urobilinogen, UA: 1 mg/dL (ref 0.0–1.0)
Urobilinogen, UA: 0.2 mg/dL (ref 0.0–1.0)
pH: 5.5 (ref 5.0–8.0)

## 2010-08-19 LAB — BASIC METABOLIC PANEL
BUN: 6 mg/dL (ref 6–23)
CO2: 29 mEq/L (ref 19–32)
Chloride: 101 mEq/L (ref 96–112)
Glucose, Bld: 101 mg/dL — ABNORMAL HIGH (ref 70–99)
Potassium: 3.6 mEq/L (ref 3.5–5.1)
Sodium: 135 mEq/L (ref 135–145)

## 2010-08-19 LAB — POCT CARDIAC MARKERS: Troponin i, poc: <0.05 ng/mL (ref 0.00–0.09)

## 2010-08-22 LAB — CBC
HCT: 43.1 % (ref 39.0–52.0)
HCT: 45.5 % (ref 39.0–52.0)
Hemoglobin: 15.1 g/dL (ref 13.0–17.0)
MCHC: 33.5 g/dL (ref 30.0–36.0)
Platelets: 214 10*3/uL (ref 150–400)
RBC: 4.75 MIL/uL (ref 4.22–5.81)
RBC: 4.98 MIL/uL (ref 4.22–5.81)
RDW: 12.5 % (ref 11.5–15.5)
WBC: 5.5 10*3/uL (ref 4.0–10.5)

## 2010-08-22 LAB — BASIC METABOLIC PANEL
CO2: 30 mEq/L (ref 19–32)
Calcium: 9.5 mg/dL (ref 8.4–10.5)
Creatinine, Ser: 0.9 mg/dL (ref 0.4–1.5)
GFR calc Af Amer: >60 mL/min (ref >60)

## 2010-08-22 LAB — URINALYSIS, ROUTINE W REFLEX MICROSCOPIC
Glucose, UA: NEGATIVE mg/dL
Hgb urine dipstick: NEGATIVE
pH: 6 (ref 5.0–8.0)

## 2010-08-22 LAB — POCT I-STAT, CHEM 8
Creatinine, Ser: 1.2 mg/dL (ref 0.4–1.5)
HCT: 49 % (ref 39.0–52.0)
Hemoglobin: 16.7 g/dL (ref 13.0–17.0)
Sodium: 138 mEq/L (ref 135–145)
TCO2: 25 mmol/L (ref 0–100)

## 2010-08-22 LAB — DIFFERENTIAL
Basophils Absolute: 0 10*3/uL (ref 0.0–0.1)
Basophils Relative: 0 % (ref 0–1)
Lymphocytes Relative: 40 % (ref 12–46)
Lymphs Abs: 2.5 10*3/uL (ref 0.7–4.0)
Monocytes Absolute: 0.5 10*3/uL (ref 0.1–1.0)
Monocytes Relative: 12 % (ref 3–12)
Monocytes Relative: 9 % (ref 3–12)
Neutro Abs: 3 10*3/uL (ref 1.7–7.7)
Neutro Abs: 3.5 10*3/uL (ref 1.7–7.7)
Neutrophils Relative %: 58 % (ref 43–77)

## 2010-08-22 LAB — RAPID URINE DRUG SCREEN, HOSP PERFORMED
Amphetamines: NOT DETECTED
Benzodiazepines: NOT DETECTED
Cocaine: NOT DETECTED
Tetrahydrocannabinol: NOT DETECTED

## 2010-08-22 LAB — COMPREHENSIVE METABOLIC PANEL
ALT: 24 U/L (ref 0–53)
Albumin: 3.7 g/dL (ref 3.5–5.2)
Calcium: 9.2 mg/dL (ref 8.4–10.5)
Glucose, Bld: 101 mg/dL — ABNORMAL HIGH (ref 70–99)
Potassium: 3.9 mEq/L (ref 3.5–5.1)
Sodium: 142 mEq/L (ref 135–145)
Total Protein: 6.7 g/dL (ref 6.0–8.3)

## 2010-08-22 LAB — ETHANOL
Alcohol, Ethyl (B): <5 mg/dL (ref 0–10)
Alcohol, Ethyl (B): <5 mg/dL (ref 0–10)

## 2010-08-22 LAB — CK: Total CK: 205 U/L (ref 7–232)

## 2010-08-22 LAB — POCT TOXICOLOGY PANEL

## 2010-08-23 ENCOUNTER — Other Ambulatory Visit: Payer: Self-pay | Admitting: *Deleted

## 2010-08-23 LAB — COMPREHENSIVE METABOLIC PANEL
Alkaline Phosphatase: 99 U/L (ref 39–117)
BUN: 8 mg/dL (ref 6–23)
Chloride: 102 mEq/L (ref 96–112)
Glucose, Bld: 79 mg/dL (ref 70–99)
Potassium: 3.8 mEq/L (ref 3.5–5.1)
Total Bilirubin: 0.6 mg/dL (ref 0.3–1.2)

## 2010-08-23 LAB — POCT TOXICOLOGY PANEL

## 2010-08-23 LAB — URINALYSIS, ROUTINE W REFLEX MICROSCOPIC
Nitrite: NEGATIVE
Specific Gravity, Urine: 1.01 (ref 1.005–1.030)
pH: 6 (ref 5.0–8.0)

## 2010-08-23 LAB — DIFFERENTIAL
Basophils Absolute: 0 10*3/uL (ref 0.0–0.1)
Basophils Relative: 0 % (ref 0–1)
Eosinophils Absolute: 0.1 10*3/uL (ref 0.0–0.7)
Monocytes Absolute: 0.8 10*3/uL (ref 0.1–1.0)
Neutro Abs: 2.9 10*3/uL (ref 1.7–7.7)
Neutrophils Relative %: 43 % (ref 43–77)

## 2010-08-23 LAB — CBC
HCT: 48.1 % (ref 39.0–52.0)
Hemoglobin: 16.2 g/dL (ref 13.0–17.0)
RBC: 5.05 MIL/uL (ref 4.22–5.81)
WBC: 6.6 10*3/uL (ref 4.0–10.5)

## 2010-08-23 LAB — ETHANOL: Alcohol, Ethyl (B): <5 mg/dL (ref 0–10)

## 2010-08-28 LAB — DIFFERENTIAL
Basophils Absolute: 0 10*3/uL (ref 0.0–0.1)
Eosinophils Relative: 1 % (ref 0–5)
Lymphocytes Relative: 53 % — ABNORMAL HIGH (ref 12–46)
Lymphs Abs: 3.2 10*3/uL (ref 0.7–4.0)
Monocytes Absolute: 0.5 10*3/uL (ref 0.1–1.0)
Monocytes Relative: 8 % (ref 3–12)

## 2010-08-28 LAB — CBC
HCT: 48 % (ref 39.0–52.0)
Hemoglobin: 15.8 g/dL (ref 13.0–17.0)
RBC: 4.98 MIL/uL (ref 4.22–5.81)
RDW: 12.5 % (ref 11.5–15.5)

## 2010-08-28 LAB — BASIC METABOLIC PANEL
CO2: 29 mEq/L (ref 19–32)
GFR calc Af Amer: >60 mL/min (ref >60)
GFR calc non Af Amer: >60 mL/min (ref >60)
Glucose, Bld: 80 mg/dL (ref 70–99)
Potassium: 4.3 mEq/L (ref 3.5–5.1)
Sodium: 141 mEq/L (ref 135–145)

## 2010-08-28 LAB — POCT CARDIAC MARKERS: Myoglobin, poc: 34.3 ng/mL (ref 12–200)

## 2010-09-13 ENCOUNTER — Emergency Department (HOSPITAL_BASED_OUTPATIENT_CLINIC_OR_DEPARTMENT_OTHER)
Admission: EM | Admit: 2010-09-13 | Discharge: 2010-09-13 | Disposition: A | Discharge status: Home / Self Care | Payer: Medicaid Other | Attending: Emergency Medicine | Admitting: Emergency Medicine

## 2010-09-13 ENCOUNTER — Telehealth: Payer: Self-pay | Admitting: *Deleted

## 2010-09-13 ENCOUNTER — Emergency Department (INDEPENDENT_AMBULATORY_CARE_PROVIDER_SITE_OTHER): Payer: Medicaid Other

## 2010-09-13 DIAGNOSIS — R0989 Other specified symptoms and signs involving the circulatory and respiratory systems: Secondary | ICD-10-CM

## 2010-09-13 DIAGNOSIS — R0602 Shortness of breath: Secondary | ICD-10-CM

## 2010-09-13 DIAGNOSIS — R112 Nausea with vomiting, unspecified: Secondary | ICD-10-CM

## 2010-09-13 DIAGNOSIS — R05 Cough: Secondary | ICD-10-CM

## 2010-09-13 DIAGNOSIS — E860 Dehydration: Secondary | ICD-10-CM | POA: Insufficient documentation

## 2010-09-13 DIAGNOSIS — R197 Diarrhea, unspecified: Secondary | ICD-10-CM

## 2010-09-13 DIAGNOSIS — Z21 Asymptomatic human immunodeficiency virus [HIV] infection status: Secondary | ICD-10-CM | POA: Insufficient documentation

## 2010-09-13 DIAGNOSIS — F17200 Nicotine dependence, unspecified, uncomplicated: Secondary | ICD-10-CM | POA: Insufficient documentation

## 2010-09-13 DIAGNOSIS — B9789 Other viral agents as the cause of diseases classified elsewhere: Secondary | ICD-10-CM | POA: Insufficient documentation

## 2010-09-13 DIAGNOSIS — R059 Cough, unspecified: Secondary | ICD-10-CM | POA: Insufficient documentation

## 2010-09-13 LAB — DIFFERENTIAL
Eosinophils Absolute: 0 10*3/uL (ref 0.0–0.7)
Lymphocytes Relative: 20 % (ref 12–46)
Lymphs Abs: 2.1 10*3/uL (ref 0.7–4.0)
Neutro Abs: 7.4 10*3/uL (ref 1.7–7.7)
Neutrophils Relative %: 70 % (ref 43–77)

## 2010-09-13 LAB — CBC
HCT: 42.5 % (ref 39.0–52.0)
MCV: 88.5 fL (ref 78.0–100.0)
Platelets: 166 10*3/uL (ref 150–400)
RBC: 4.8 MIL/uL (ref 4.22–5.81)
WBC: 10.5 10*3/uL (ref 4.0–10.5)

## 2010-09-13 LAB — COMPREHENSIVE METABOLIC PANEL
Albumin: 4.1 g/dL (ref 3.5–5.2)
Alkaline Phosphatase: 111 U/L (ref 39–117)
BUN: 12 mg/dL (ref 6–23)
Chloride: 99 mEq/L (ref 96–112)
Potassium: 3.6 mEq/L (ref 3.5–5.1)
Total Bilirubin: 0.4 mg/dL (ref 0.3–1.2)

## 2010-09-13 LAB — URINALYSIS, ROUTINE W REFLEX MICROSCOPIC
Glucose, UA: NEGATIVE mg/dL
Hgb urine dipstick: NEGATIVE
Ketones, ur: 15 mg/dL — AB
Protein, ur: 30 mg/dL — AB

## 2010-09-13 LAB — URINE MICROSCOPIC-ADD ON

## 2010-09-13 IMAGING — CR DG CHEST 2V
2 series · 2 of 2 positions shown · non-contrast
Comparison: [DATE]

CLINICAL DATA: Shortness of breath, cough and congestion.  Nausea,
vomiting and diarrhea.

CHEST - 2 VIEW

[w chest pa]
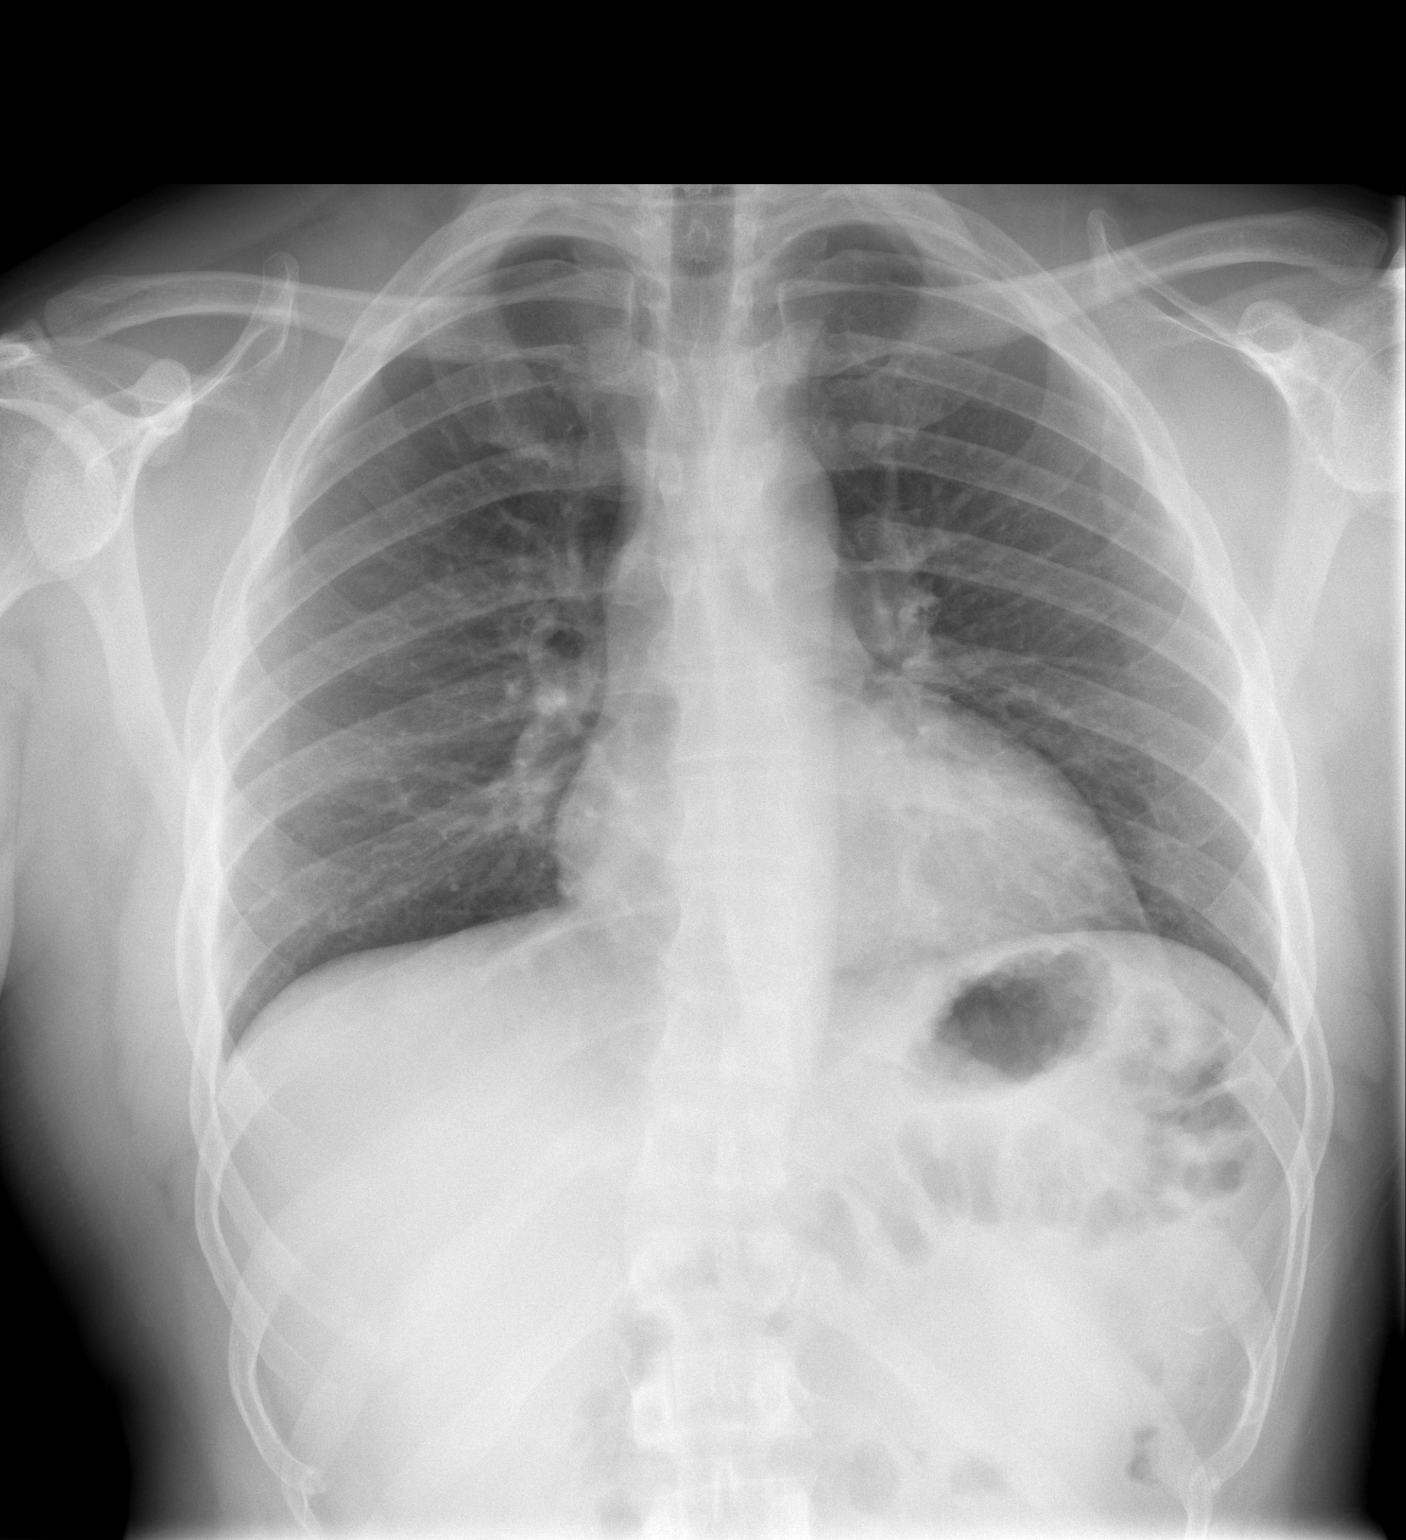

[w chest lat]
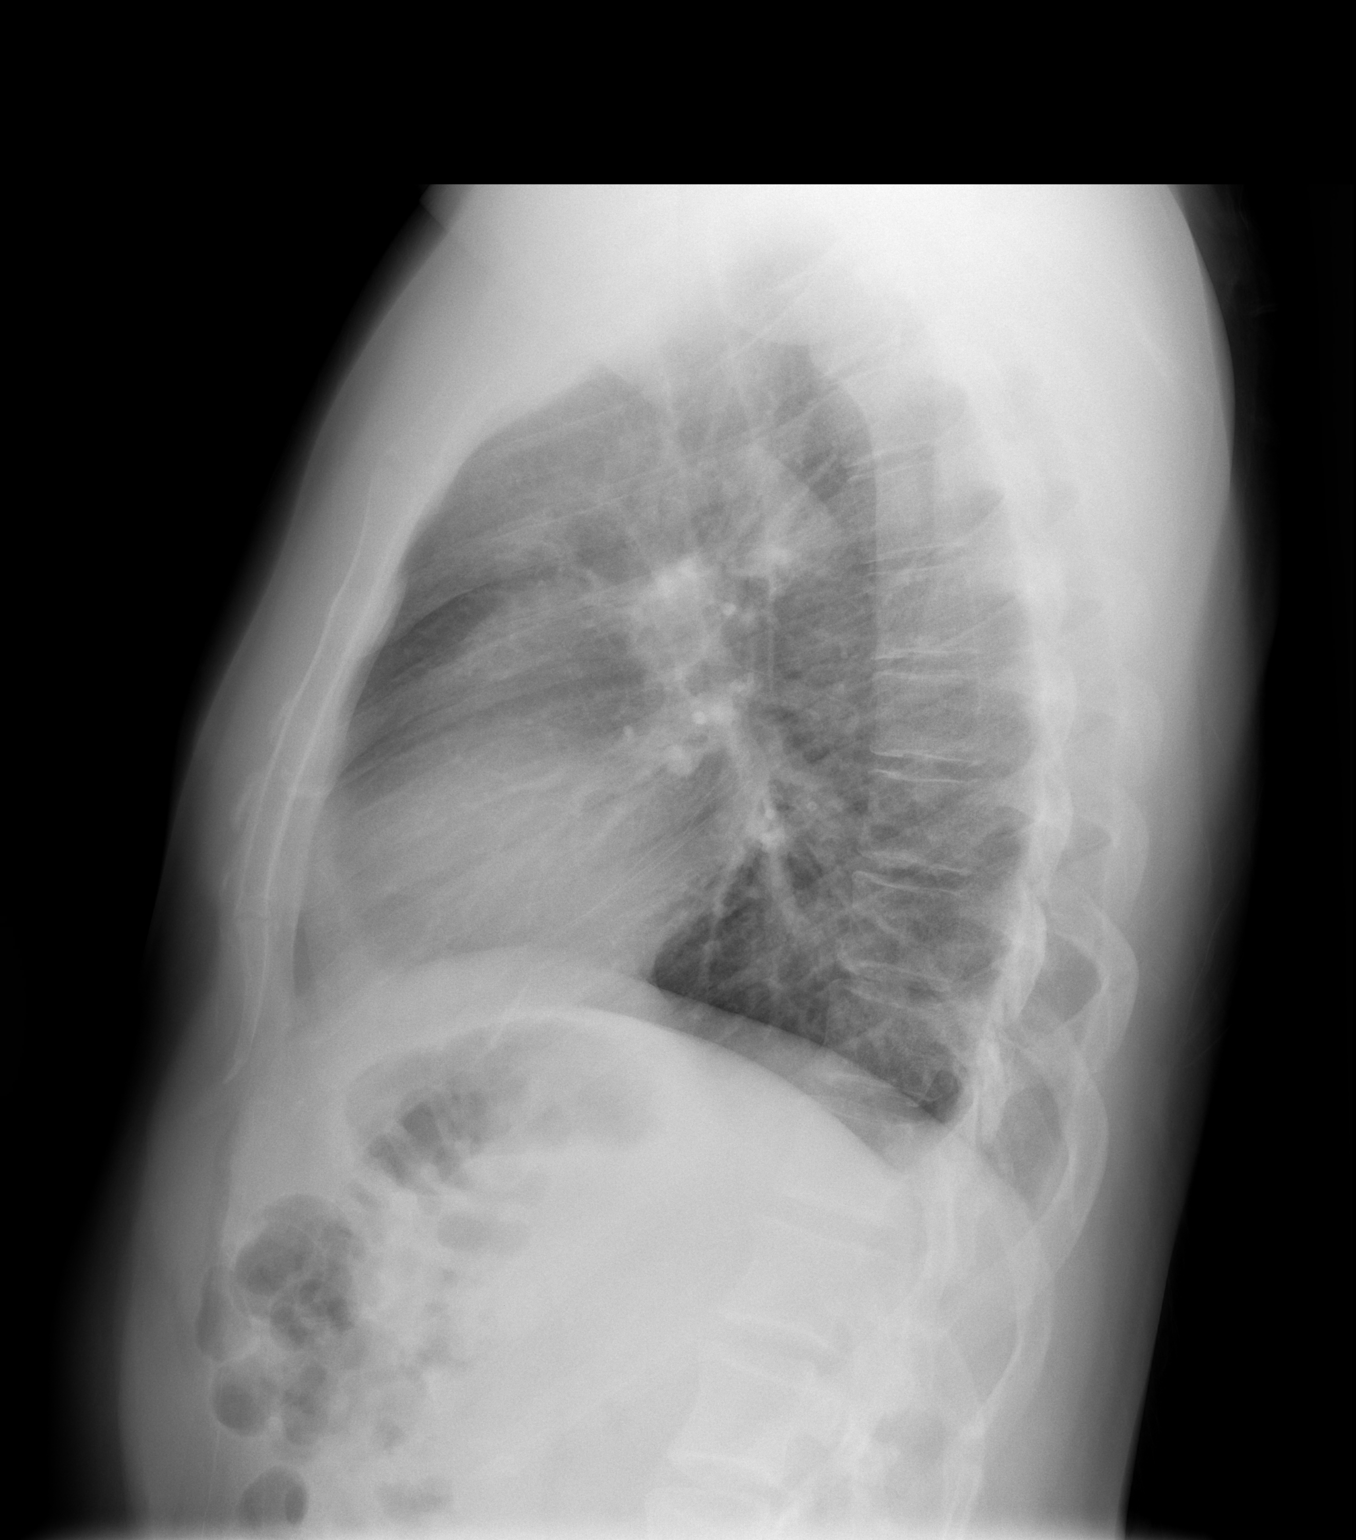

[2 of 2 positions shown; findings below may reference images not displayed]

FINDINGS: Trachea is midline.  Heart size is accentuated by low
lung volumes.  Lungs are clear.  No pleural fluid.
IMPRESSION: No acute findings.

## 2010-09-13 NOTE — Telephone Encounter (Signed)
Pt described symptoms of fever, sweating, productive cough, muscle stiffness in neck, shoulders and legs.  Having difficulty walking.  Symptoms started 2 days ago.  Today having nausea and vomiting.  Pt does have transportation to [facility] on [address] from [facility].  RN advised going for evaluation ASAP due to severity of symptoms and possibility of dehydration.  Pt verbalized understanding of this advice and stated that he would go to the UC. Jennet Maduro, RN

## 2010-09-25 ENCOUNTER — Ambulatory Visit (INDEPENDENT_AMBULATORY_CARE_PROVIDER_SITE_OTHER): Payer: Medicaid Other | Admitting: *Deleted

## 2010-09-25 ENCOUNTER — Other Ambulatory Visit (INDEPENDENT_AMBULATORY_CARE_PROVIDER_SITE_OTHER): Payer: Medicaid Other

## 2010-09-25 ENCOUNTER — Encounter: Payer: Self-pay | Admitting: *Deleted

## 2010-09-25 VITALS — BP 124/83 | HR 70 | Temp 97.6°F | Resp 20 | Ht 73.0 in | Wt 229.8 lb

## 2010-09-25 DIAGNOSIS — B20 Human immunodeficiency virus [HIV] disease: Secondary | ICD-10-CM

## 2010-09-25 DIAGNOSIS — F39 Unspecified mood [affective] disorder: Secondary | ICD-10-CM

## 2010-09-25 DIAGNOSIS — R0981 Nasal congestion: Secondary | ICD-10-CM

## 2010-09-25 LAB — POCT URINALYSIS DIPSTICK
Glucose, UA: NEGATIVE
Nitrite, UA: NEGATIVE
Urobilinogen, UA: 0.2

## 2010-09-25 NOTE — Progress Notes (Signed)
Patient here today to screen for the START study. He was referred by a friend who is currently on the study. He was diagnosed in 2010 and has been dealing with mental health issues ever since. He reports anxiety, bipolar disorder with some schizophrenic behaviors and is undergoing  outpatient therapy. Informed consent was obtained and all questions answered. He is willing to be randomized to either arm. We discussed possible regimens if he is put on treatment right away. He has medicaid and has been to a PCP recently for sinus congestion. He is living in a shelter in [facility] for now, but will be moving to an apartment the first of June.  He is returning on the 29th for repeat labs and EKG. He has an appt. That day with Brad.

## 2010-09-26 LAB — HIV-1 RNA QUANT-NO REFLEX-BLD
HIV 1 RNA Quant: 2980 copies/mL — ABNORMAL HIGH (ref <20)
HIV-1 RNA Quant, Log: 3.47 {Log} — ABNORMAL HIGH (ref <1.30)

## 2010-09-26 LAB — COMPLETE METABOLIC PANEL WITH GFR
AST: 14 U/L (ref 0–37)
Albumin: 4.5 g/dL (ref 3.5–5.2)
Alkaline Phosphatase: 92 U/L (ref 39–117)
BUN: 9 mg/dL (ref 6–23)
Creat: 0.97 mg/dL (ref 0.40–1.50)
Potassium: 4.4 mEq/L (ref 3.5–5.3)
Total Bilirubin: 0.4 mg/dL (ref 0.3–1.2)

## 2010-09-26 NOTE — H&P (Signed)
NAMEBRAUN, ROCCA NO.:  [id number]   MEDICAL RECORD NO.:  [id number]          PATIENT TYPE:  IPS   LOCATION:  0301                          FACILITY:  [facility]   PHYSICIAN:  Jasmine Pang, M.D. DATE OF BIRTH:  1975/06/05   DATE OF ADMISSION:  09/24/2008  DATE OF DISCHARGE:                       PSYCHIATRIC ADMISSION ASSESSMENT   This is a voluntary admission to the services of Dr. Milford Cage.   IDENTIFYING INFORMATION:  This is a 35 year old single [place] American  male.  Mr. Hellard has once again become noncompliant with his meds.  He  stated that the Geodon made him feel weird, which interestingly enough  is exactly what he said when he was admitted from April 30 to May 6  about his Celexa.  He states that he is having poor sleep, racing  thoughts.  He denies auditory or visual hallucinations.  He states that  he still has not reconciled his diagnosis of HIV, although he is not  required to take any medications.  He states that he froze up, went  into a panic mode.  He denies any drug or alcohol abuse.  He states that  he did start smoking a lot, and late in the conversation, he requests  Ativan.  He states that definitely helped.   PAST PSYCHIATRIC HISTORY:  As already acknowledged, he was recently with  [place] from April 30 to May 6.  At that time, he had passed out in the  emergency department after reporting that he was having auditory and  visual hallucinations.  He stated that he did not feel like himself and  reported that he had recently been diagnosed with HIV.  Today, he states  that he was actually given this diagnosis back in February.  He does  have a counselor, Margrett Rud, who is his case manager at [facility]; however, he has not availed himself of case management.   SOCIAL HISTORY:  He is a high Garment/textile technologist in 1998.  He has never  married.  He has no children.  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  He quit his job  abruptly.   FAMILY HISTORY:  Negative.   ALCOHOL/DRUG HISTORY:  He does acknowledge smoking.   PRIMARY CARE PHYSICIAN:  Dr. Jarold Motto at [facility] in [place].   MEDICAL PROBLEMS:  He was found to be HIV positive in February, 2010.  He is not requiring any medication at this time.   MEDICATIONS:  When he was discharged from the hospital, he was  apparently discharged on:  1. Geodon 40 mg in the morning and 80 mg at supper.  2. Celexa 20 mg p.o. daily.  3. Ambien 10 mg q.h.s. p.r.n.   DRUG ALLERGIES:  He has no known drug allergies.   POSITIVE PHYSICAL FINDINGS:  A well-developed and well-nourished African  American male who appears his stated age of 46.  He had no positives on  his review of symptoms.  His blood work was not repeated, as he had just  been here.  I do not have  his vital signs in front of me at the moment.   MENTAL STATUS EXAM:  He was easily arousable.  He came to the exam room  where he was seen in conjunction with Dr. Milford Cage.  He is  appropriately groomed, dressed, and nourished.  His speech is not  pressured.  His mood is anxiously depressed.  Thought processes are  clear, rational, and goal-oriented.  He requested Ativan to help him  calm down.  He also stated that he is not sure that he can return back  to his parents' home again.  Judgment and insight are fair.  Concentration and memory are intact.  Intelligence is at least average.  He denied being suicidal or homicidal.  He denied having auditory or  visual hallucinations.   DIAGNOSES:   AXIS I:  Major depressive disorder without psychotic features,  exacerbation of symptoms due to noncompliance.   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   AXIS III:  Human immunodeficiency virus positive, diagnosed in February.   AXIS IV:  Severe, once again, noncompliant with medications.   AXIS V:  20.   PLAN:  Admit for safety and stabilization, to reestablish  compliance  with medication.  Towards that end, he will get Seroquel 50 mg at night  to help him sleep every night.  Instead of the Ativan, will give him  Neurontin 300 mg p.o. p.r.n. anxiety and will have the case manager  contact his HIV case manager at [facility] on Monday.  Estimated length  of stay is 3 to 5 days.      Mickie Leonarda Salon, P.A.-C.      Jasmine Pang, M.D.  Electronically Signed    MD/MEDQ  D:  09/25/2008  T:  09/25/2008  Job:  [id number]

## 2010-09-26 NOTE — Discharge Summary (Signed)
Alan Rangel NO.:  [id number]   MEDICAL RECORD NO.:  [id number]          PATIENT TYPE:  IPS   LOCATION:  0508                          FACILITY:  [facility]   PHYSICIAN:  Alan Jungling, MD  DATE OF BIRTH:  22-Oct-1975   DATE OF ADMISSION:  09/10/2008  DATE OF DISCHARGE:  09/16/2008                               DISCHARGE SUMMARY   IDENTIFYING DATA AND REASON FOR ADMISSION:  This was an inpatient  psychiatric admission for Alan Rangel, a 35 year old male who was admitted due  to increasing signs and symptoms of psychosis.  Please refer to the  admission note for further details pertaining to the symptoms,  circumstances and history that led to his hospitalization.  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   MEDICAL AND LABORATORY:  The patient was medically and physically  assessed by the psychiatric nurse practitioner.  He came to [place] in  generally good health, without any active or chronic medical problems.  There were no significant medical issues.   HOSPITAL COURSE:  The patient was admitted to the adult inpatient  psychiatric service.  He presented as a well-nourished, normally-  developed male who was initially withdrawn, disoriented, stating he did  not know where he was.  He was guarded, and made no spontaneous  comments.  He stated that his parents were in [place], and that he had  no family locally.  He was unable to give much clear historical  information, including whether or not he had any place to live.   The patient had recently been started on a trial of Celexa.  It did not  appear that this was necessary.  It was discontinued, and a trial of  Geodon was initiated.  In the meantime we attempted to learn more about  him through collateral contacts.   By the third hospital day the patient indicated that he felt good, but  he was still feeling somewhat tired.  By this time we had reached his  parents, and they  indicated they might be traveling down from [place]  to assist their son, although it was not clear to [place] before a couple of  days whether or not they actually would.  However, on the eighth  hospital day his parents did come to the [place] area and were  available for a family session.  By that time, the patient was doing  very well.  His psychosis had cleared, and he reported that he was no  longer hearing voices.  Parents were very supportive.  They indicated  that they would support moving the patient back to [place] to assist  him and look after him.  The patient indicated that he had no thoughts  of harming himself, and he was able to contract for safety.  He and his  parents agreed to the following aftercare plan.   AFTERCARE:  The patient was to follow up with [facility], with an appointment to see their psychiatrist on May 25 at 10:30  a.m.  He  was also scheduled to see a therapist, Margrett Rud.   DISCHARGE MEDICATIONS:  1. Geodon 40 mg q.a.m. and 80 mg q.h.s.  2. Ambien 10 mg p.r.n. insomnia.   DISCHARGE DIAGNOSES:  AXIS I:  Psychosis not otherwise specified.  AXIS II:  Deferred.  AXIS III:  No acute or chronic illnesses.  AXIS IV:  Stressors severe.  AXIS V:  GAF on discharge 55.      Alan Jungling, MD  Electronically Signed     SPB/MEDQ  D:  09/30/2008  T:  09/30/2008  Job:  [id number]

## 2010-09-26 NOTE — Discharge Summary (Signed)
NAMEJOSEDE, CICERO NO.:  [id number]   MEDICAL RECORD NO.:  [id number]          PATIENT TYPE:  IPS   LOCATION:  0301                          FACILITY:  [facility]   PHYSICIAN:  Jasmine Pang, M.D. DATE OF BIRTH:  July 14, 1975   DATE OF ADMISSION:  09/24/2008  DATE OF DISCHARGE:  10/04/2008                               DISCHARGE SUMMARY   IDENTIFICATION:  This is a 35 year old single African American male who  was admitted on a voluntary basis on Sep 24, 2008.   HISTORY OF PRESENT ILLNESS:  The patient states he has been noncompliant  with his medications.  He said that Geodon made him feel weird.  He  has had a previous complaint on September 10, 2008, when he was admitted to  the hospital about his Celexa, making him feel weird.  He states he is  having poor sleep and racing thoughts.  He denies auditory or visual  hallucinations.  He states that he still has not reconciled his  diagnosis of HIV, although he has not required to take any medications.  He states he went into a panic mode.  He denies any drug or alcohol  abuse.  He says he did start smoking a lot of cigarettes after he found  out his diagnosis.  As already acknowledged, he was recently with [place]  from September 10, 2008 to Sep 16, 2008.  For further admission information,  see psychiatric admission assessment.   PHYSICAL FINDINGS:  The patient had a complete physical exam in the  emergency room prior to transfer to our unit.  There were no positives  on his review of symptoms.  There were no physical or medical problems  noted.  Diagnostic studies were not reordered since he had just been in  the hospital on September 10, 2008.   HOSPITAL COURSE:  Upon admission, the patient was started on Seroquel 50  mg x1.  He was also started on Neurontin 300 mg p.o. q.4 h. p.r.n.  anxiety, and Seroquel 50 mg at bedtime p.r.n. insomnia with a may repeat  x1.  He was also started on Vistaril 25 mg q.6 h. p.r.n. anxiety.  In  individual sessions, the patient was initially casually dressed with  fair eye contact.  Speech was soft and slow.  There was psychomotor  retardation.  Mood was depressed and anxious.  Affect consistent with  mood.  There was positive suicidal ideation.  No homicidal ideations.  There was no evidence of a thought disorder or psychosis.  As  hospitalization progressed, he continued to feel overwhelmed.  His  sleep and appetite were poor.  He continued to be depressed and anxious  with suicidal ideation.  There was positive psychomotor retardation.  Speech was soft and slow and minimal eye contact.  His mental status  continued until Sep 29, 2008.  On that day, he stated he felt calmer.  He had been taken off all of his medications including the Celexa,  Vistaril, Seroquel, and Neurontin.  He was placed on Claritin 10 mg  daily due to what appeared  to be sinus stuffiness.  He was given 1 mg of  Ativan q.4 h. p.r.n. anxiety.  This was later changed to Ativan 0.5 mg  p.o. t.i.d. and 1 mg q.h.s.  His Risperdal had been discontinued as  well.  He discussed his despair and shock at the diagnosis of HIV, has  been trying to process this in use and the guilt feelings he has was  with regards to his parents.  On Sep 30, 2008, he continued to be less  depressed and less anxious.  Celexa was restarted at 20 mg p.o. daily.  On Oct 01, 2008, he was started on Abilify 5 mg p.o. q.h.s. and he  discussed his numerous stressors including HIV and being out of  relationship.  The end of this relationship had been very difficult for  him.  On Oct 02, 2008 the patient was reporting a lot of anxiety.  His  Abilify was discontinued.  He was given Risperdal M-Tab 0.5 mg p.o.  q.h.s.  On Oct 04, 2008, his mood was less depressed, less anxious.  Affect was consistent with mood.  There was no suicidal or homicidal  ideation.  No thoughts of self-injurious behavior.  No auditory or  visual hallucinations.  No  paranoia or delusions.  Thoughts were logical  and goal-directed.  Thought content, no predominant theme.  Cognitive  was grossly intact.  Insight good.  Judgment good.  Impulse control  good.  It was felt the patient was safe for discharge and he wanted to  go home.  He stated he wanted to find a job soon.   DISCHARGE DIAGNOSES:  Axis I: Mood disorder, not otherwise specified.  Anxiety disorder, not otherwise specified.  Axis II: None.  Axis III: Human immunodeficiency virus positive, diagnosed in February.  Axis IV: Severe (noncompliant with medications, burden of medical  illness, burden of psychiatric problems).  Axis V: Global assessment of functioning was 50 upon discharge.  GAF was  20 upon admission.  GAF highest past year was 60.   DISCHARGE PLANS:  There was no specific activity level or dietary  restriction.   POSTHOSPITAL CARE PLANS:  The patient will be seen at the [facility] on October 19, 2008 at 9:30 a.m.   DISCHARGE MEDICATIONS:  1. Claritin 10 mg daily.  2. Flonase 1 spray each nostril daily for allergies.  3. Celexa 20 mg daily.  4. Ativan 0.5 mg twice a day.  5. Risperdal 0.5 mg at bedtime and up to twice a day as needed.  6. Ambien 10 mg at bedtime as needed for insomnia.      Jasmine Pang, M.D.  Electronically Signed     BHS/MEDQ  D:  10/04/2008  T:  10/05/2008  Job:  [id number]

## 2010-09-26 NOTE — H&P (Signed)
NAMELUISDANIEL, Alan Rangel NO.:  [id number]   MEDICAL RECORD NO.:  [id number]          PATIENT TYPE:  IPS   LOCATION:  0406                          FACILITY:  [facility]   PHYSICIAN:  Anselm Jungling, MD  DATE OF BIRTH:  1976/02/22   DATE OF ADMISSION:  09/10/2008  DATE OF DISCHARGE:                       PSYCHIATRIC ADMISSION ASSESSMENT   This is a 35 year old male, voluntarily admitted on September 09, 2008.   HISTORY OF PRESENT ILLNESS:  The patient was initially seen in the  emergency department, where records show that the patient had passed  out.  He was reporting he was having visual and auditory hallucinations  and not feeling like himself, complaining of a headache and chest pain.  He also apparently recently found out that he was HIV-positive.  Also  recently started on an antidepressant.  He denies any substance use, was  hearing voices, telling him to harm himself, denies any homicidal  ideation.   PAST PSYCHIATRIC HISTORY:  First admission to the [facility].  No apparent past psychiatric hospitalizations.   SOCIAL HISTORY:  He is a 35 year old male who lives in [place] with  his mother and he is unemployed.   FAMILY HISTORY:  None.   ALCOHOL OR DRUG HISTORY:  No alcohol or drug use.  Primary care Jaedyn Lard  is Dr. Jarold Motto at [facility] in [place].   MEDICAL PROBLEMS:  He is HIV-positive, not currently on any antivirals.   MEDICATIONS:  He was started on Celexa which is making him feel weird.   DRUG ALLERGIES:  AMOXICILLIN with a reaction of a rash.   PHYSICAL EXAM:  This is a well-nourished, well-developed male.  He was  fully assessed at the [facility] Emergency Department.  Physical exam was reviewed with no significant findings.  The patient  did receive some IV fluids and Geodon 20 mg IM.  Also noted in the  emergency room records, the patient became very afraid of a tattoo on  his forearm, stating that it was  the mark of the beast.   LABORATORY DATA:  Shows a CBC that is within normal limits.  Alcohol  level less than 5.  Urine drug screen was negative.  Acetaminophen level  less than 10.  Salicylate less than 1 and his CAT scan of his head was  no inflammatory changes, no acute intracranial pathology.   MENTAL STATUS EXAM:  The patient at this time is in bed with poor eye  contact, guarded.  He appears to be internally distracted and endorsing  auditory hallucinations.  He currently has a Kerlix wrap to his left  forearm, as he is having some delusional thinking, feeling that there is  something that is going to come out from underneath the dressing.  His  judgment and insight is poor.   AXIS I:  Psychosis NOS.  AXIS II:  Deferred.  AXIS III:  HIV positive.  AXIS IV:  Deferred at this time.  AXIS V:  Current is 25-30.   PLAN:  Our plan is to contract for safety, stabilize mood and thinking.  We will have Geodon scheduled b.i.d.  We will stop his Celexa.  We will  also have some emergency sedation medications available.  We will  continue to gather more history, identify his support group and case  manager will assess his follow-up.  His tentative length of stay at this  time is 4-6 days.      [name], N.P.      Anselm Jungling, MD  Electronically Signed    JO/MEDQ  D:  09/10/2008  T:  09/10/2008  Job:  [id number]

## 2010-09-26 NOTE — H&P (Signed)
Alan Rangel, Alan Rangel NO.:  [id number]   MEDICAL RECORD NO.:  [id number]          PATIENT TYPE:  IPS   LOCATION:  0505                          FACILITY:  [facility]   PHYSICIAN:  Anselm Jungling, MD  DATE OF BIRTH:  16-Mar-1976   DATE OF ADMISSION:  12/22/2008  DATE OF DISCHARGE:                       PSYCHIATRIC ADMISSION ASSESSMENT   IDENTIFYING INFORMATION:  This is a 35 year old African American male.  This is a voluntary admission.   HISTORY OF PRESENT ILLNESS:  This is the third [facility] admission for this 36-  year-old who has had a lot of problems with anxiety since he was  diagnosed HIV positive at [facility] in February 2010.  He  was seen by Dr. Jarold Motto there.  He does not require medication therapy  at this time.  He presents now with anxiety, complaining of depression  and complains of being in constant panic.  Recently, her broke up with  a relationship, but complaining feeling that his head is swimming from  time to time.  His skin is tight, neck feels tight, muscles feel tight.  He has complained intermittently of some suicidal thoughts, but no  specific plan and occasionally having some auditory hallucinations with  voices telling him to hurt himself.  No previous suicide attempts.  He  presented in the emergency room with similar symptoms on December 19, 2008  and December 20, 2008, and was discharged.  He admits going from hospital  to hospital and was discharged from [facility] 2 weeks ago where they  continued his Celexa 20 mg and discharged him on Ativan 1 mg as needed  for anxiety.  He denies any homicidal thoughts and expresses no suicidal  thoughts today.   PAST PSYCHIATRIC HISTORY:  Third [facility] admission.  Previous admission here  in April and May of this year.  He has complained of hearing voices in  the past.  He was previously started on Celexa 20 mg daily.  He has  complained at times of it causing him to feel dizzy.  He had a CT scan  on Oct 05, 2008, for complaints of a headache which was negative for any  acute findings.  No history of brain injury.  No history of learning  disability.  Most recent hospitalization at [facility] two weeks ago.  He  denies any current or past history of substance abuse.   SOCIAL HISTORY:  Single African American male.  Basic education.  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  Reports that he had been living with his parents in [place] for a  while, bu9the they sent him back to [place].  He now has been  going from hotel room to hotel room and occasionally staying with  friends.   FAMILY HISTORY:  Negative for mental illness or substance abuse.   MEDICAL HISTORY:  Primary care physician is Dr. Jarold Motto at [facility] of infectious disease department.  Medical problems are HIV positivity, diagnosed February 2010.   CURRENT MEDICATIONS:  1. Celexa 20 mg  daily.  2. Ativan, dose unknown.  Medication compliance is unclear.  3. He has also taken Vistaril and trazodone in the past.   DRUG ALLERGIES:  AMOXICILLIN.   POSITIVE PHYSICAL FINDINGS:  Physical exam was done in the emergency  room as noted in the record.  Urine drug screen positive for  benzodiazepines.  Chemistry normal.  CBC unremarkable.  Hemoglobin 15.9,  hematocrit 50.8, platelets 201,000.   MENTAL STATUS EXAM:  A fully alert male in the bed, oriented with an  anxious affect, tearful at times.  Speech is normal, able to give a  coherent history.  Asking me about somatic symptoms, muscle tightness,  and we discussed symptoms of anxiety today.  Affect anxious, expressing  no suicidal or homicidal thoughts today.  No signs of psychosis.  Cognitively he is intact.   Axis I:  Rule out panic disorder, not otherwise specified.  Axis II:  Deferred.  Axis III:  No diagnosis.  Axis IV:  Deferred.  Axis V:  Global Assessment of Functioning current  44, past year not  known.   PLAN:  Voluntarily admit him to stabilize and alleviate his panic.  At  this point, we are going to discontinue his Celexa since it seems to  have not helped him and from time to time he has complained that it made  him feel strange.  He is on Ambien 10 mg h.s. p.r.n. insomnia.  We will  get some additional history.  He is enrolled in our grief and loss  group.      Margaret A. Lorin Picket, N.P.      Anselm Jungling, MD  Electronically Signed    MAS/MEDQ  D:  12/24/2008  T:  12/24/2008  Job:  [id number]

## 2010-09-29 NOTE — Discharge Summary (Signed)
Alan Rangel, PERRY NO.:  [id number]   MEDICAL RECORD NO.:  [id number]          PATIENT TYPE:  IPS   LOCATION:  0505                          FACILITY:  [facility]   PHYSICIAN:  Jasmine Pang, M.D. DATE OF BIRTH:  09-25-1975   DATE OF ADMISSION:  12/22/2008  DATE OF DISCHARGE:  12/29/2008                               DISCHARGE SUMMARY   IDENTIFICATION:  This is a 35 year old single African American male, who  was admitted on a voluntary basis on December 22, 2008.   HISTORY OF PRESENT ILLNESS:  This is a third [facility] admission for this 42-  year-old who has had a lot of problems with anxiety since he was  diagnosed with HIV.  Since he was diagnosed as being HIV positive at  [facility] in February 2010, he was see by Dr. Jarold Motto  there.  He does not require medication therapy at this time.  He  presents now with anxiety, complaining of depression, and complaints of  being constant panic.  Recently, he broke up with his significant  other, which has been a major stressor for him.  He has complain  intermittently of some suicidal thoughts, but no specific plan,  occasionally having some auditory hallucinations with voices telling him  to hurt himself.  No previous suicide attempts.  He presented in the  emergency room with similar symptoms in December 19, 2008 and December 20, 2008 and was discharged.  He admits going from hospital to hospital, was  discharged from [facility] 2 weeks ago, where they continued his Celexa 20  mg.  They discharged him on Ativan 1 mg as needed for anxiety.  He  denies any homicidal thoughts and expresses no suicidal thoughts.  This  is a third [facility] admission for the patient.  Previous admission here was  in April or May 2010.  He is complained of hearing voices in the past.  He has previously started on Celexa 20 mg daily.  He is complained of it  at times causing him to feel dizzy.  He had a CT scan in Oct 05, 2008,  for complaints of a  headache, which was negative for any acute findings.  There is no history of brain injury.  No history of learning  disabilities, some recent hospitalization at [facility] 2 weeks ago.  He  denies any current or past history of substance abuse.  For further  admission information, see psychiatric admission assessment.  Initially,  he was diagnosed with panic disorder not otherwise specified.   PHYSICAL FINDINGS:  There were no physical or medical problems noted in  the ED prior to transfer to [place].   ADMISSION LABORATORIES:  Urine drug screen was positive for  benzodiazepines.  Chemistry panel was normal.  CBC was unremarkable.  Hemoglobin was 15.9, hematocrit was 50.8, and platelets were 201,000.   HOSPITAL COURSE:  Upon admission, the patient was continued on his  Celexa 20 mg daily.  He was also started on Ambien 10 mg p.o. q.h.s.  p.r.n. insomnia.  In individual sessions, the patient was initially  disheveled with minimal eye contact.  There was psychomotor retardation.  His speech was soft and slow.  He was drowsy and lethargic.  He was  depressed and anxious.  There was no evidence of psychosis.  His  thoughts did appear to be somewhat disorganized.  He talked about  recently being on the psych unit at [facility].  He stated now I  feel like I have made myself go crazy.  On December 24, 2008, he  continued to lie in bed with psychomotor retardation.  He was drowsy,  lethargic, depressed, and anxious.  There was poverty of thought.  Speech was soft and slow.  He was given a dose of Risperdal 4 mg ODT now  for severe agitation and Ativan 2 mg now for severe anxiety.  On December 25, 2008, he was started on Risperdal M-Tab 2 mg q.4-6 h. p.r.n.  agitation and Cogentin 1 mg p.o. t.i.d. p.r.n. EPS.  He continued to be  depressed and anxious with difficulty falling asleep.  His Ambien was  discontinued, and he was placed on trazodone 50 mg p.o. q.h.s.  He was  also started on Risperdal 2  mg p.o. q.h.s. and Ativan 1 mg p.o. q.6 h  p.r.n. anxiety.  As hospitalization progressed, he states he was a  little scared, a little paranoid.  He still very anxious and depressed.  On December 28, 2008, he complained of racing thoughts.  He has some  flight of ideas.  He stated I am just overwhelmed.  He was started on  Depakote ER 250 mg now then Depakote ER 500 mg p.o. q.h.s.  Mental  status began to improve.  He began to talk about wanting to go home.  On  December 29, 2008, the patient was less depressed, less anxious.  Psychomotor activity was within normal limits.  Eye contact was good.  Speech was normal rate and flow.  Affect was consistent with mood.  There was no suicidal or homicidal ideation.  No auditory or visual  hallucinations.  No paranoia or delusion.  Again, he was not suicidal.  Thoughts were logical and goal-directed.  He felt ready for discharge.   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  Axis III:  Human immunodeficiency virus positive.  Axis IV:  Moderate (problems with primary support group, health  problems, and burden of psychiatric illness).  Axis V:  Global assessment of functioning upon discharge was 55.  GAF  upon admission was 44.  GAF highest past year was 60 to 65.   DISCHARGE PLAN:  There was no specific activity level or dietary  restrictions.   POSTHOSPITAL CARE PLANS:  The patient will go to the [facility] on  December 30, 2008 at 9 o'clock a.m.   DISCHARGE MEDICATIONS:  1. Risperdal 2 mg at bedtime.  2. Depakote 5 mg at bedtime.      Jasmine Pang, M.D.  Electronically Signed     BHS/MEDQ  D:  01/09/2009  T:  01/10/2009  Job:  [id number]

## 2010-10-10 ENCOUNTER — Encounter: Payer: Self-pay | Admitting: Adult Health

## 2010-10-10 ENCOUNTER — Ambulatory Visit: Payer: Self-pay | Admitting: Adult Health

## 2010-10-10 ENCOUNTER — Ambulatory Visit (INDEPENDENT_AMBULATORY_CARE_PROVIDER_SITE_OTHER): Payer: Medicaid Other | Admitting: *Deleted

## 2010-10-10 ENCOUNTER — Ambulatory Visit (INDEPENDENT_AMBULATORY_CARE_PROVIDER_SITE_OTHER): Payer: Medicaid Other | Admitting: Adult Health

## 2010-10-10 DIAGNOSIS — F329 Major depressive disorder, single episode, unspecified: Secondary | ICD-10-CM

## 2010-10-10 DIAGNOSIS — B20 Human immunodeficiency virus [HIV] disease: Secondary | ICD-10-CM

## 2010-10-10 DIAGNOSIS — F22 Delusional disorders: Secondary | ICD-10-CM | POA: Insufficient documentation

## 2010-10-10 DIAGNOSIS — F411 Generalized anxiety disorder: Secondary | ICD-10-CM

## 2010-10-10 LAB — LIPID PANEL
Cholesterol: 161 mg/dL (ref 0–200)
Total CHOL/HDL Ratio: 5.2 Ratio
Triglycerides: 108 mg/dL (ref <150)
VLDL: 22 mg/dL (ref 0–40)

## 2010-10-10 LAB — BASIC METABOLIC PANEL
Potassium: 4.5 mEq/L (ref 3.5–5.3)
Sodium: 141 mEq/L (ref 135–145)

## 2010-10-10 NOTE — Progress Notes (Signed)
Patient was seen for second screening visit for START. A CD4/CD8 was drawn as well as fasting labs. An EKG was also obtained. He has recently come off of his psych meds per psychiatry and is only on xanax prn. If he is eligible , I will try to put him off study next week.

## 2010-10-10 NOTE — Progress Notes (Signed)
  Subjective:    Patient ID: Alan Rangel, male    DOB: [birth year], 35 y.o.   MRN: [id number]  HPI Presents to clinic for followup. Remains treatment nave for HIV, but has been in discussion with the research team regarding participation in the START study. Relates problems with sleeping stating. He wakes up in the middle of the night for no apparent reason. He was originally on multiple mental health. Medications, which were discontinued due to, drug reactions, according to him. He complaints of feeling tired all the time, feeling depressed, having anxiety attacks, emotional outbursts, and difficulty with concentration. Denies suicidal thoughts or plans. However, he does relate feelings of paranoia and unspecified "delusions." Denies hallucinations, but also reports that he has been, sleep walking, according to others.   Review of Systems  Constitutional: Positive for fatigue.  HENT: Negative.   Eyes: Negative.   Respiratory: Negative.   Cardiovascular: Negative.   Gastrointestinal: Negative.   Genitourinary: Negative.   Musculoskeletal: Negative.   Neurological: Negative.   Psychiatric/Behavioral: Positive for behavioral problems, sleep disturbance, decreased concentration and agitation. Negative for suicidal ideas, hallucinations, confusion, self-injury and dysphoric mood. The patient is nervous/anxious. The patient is not hyperactive.        Objective:   Physical Exam  Constitutional: He is oriented to person, place, and time. He appears well-developed and well-nourished. No distress.  HENT:  Head: Normocephalic and atraumatic.  Eyes: Conjunctivae and EOM are normal. Pupils are equal, round, and reactive to light.  Neck: Normal range of motion.  Cardiovascular: Normal rate and regular rhythm.   Pulmonary/Chest: Effort normal and breath sounds normal.  Abdominal: Soft. Bowel sounds are normal.  Musculoskeletal: Normal range of motion.  Neurological: He is alert and oriented to  person, place, and time. No cranial nerve deficit. He exhibits normal muscle tone. Coordination normal.  Skin: Skin is warm and dry.  Psychiatric: His speech is normal. Judgment normal. His mood appears anxious. His affect is blunt. He is slowed. Thought content is paranoid. Thought content is not delusional. Cognition and memory are not impaired. He exhibits a depressed mood. He expresses no homicidal and no suicidal ideation. He expresses no suicidal plans and no homicidal plans. He exhibits normal recent memory and normal remote memory.          Assessment & Plan:  1. HIV. Labs obtained 09/25/2010 show a CD4 of 967 at 40.3%, and a viral load of 2980 copies per mL. He continues to show interest in being part of research study. So, we will defer any further evaluation for treatment until we determine how he will be randomized. If he is randomized to the treatment arm, we will opt for Isentress with Truvada therapy.  2. Depression with Anxiety and Delusional Thoughts. We recommend that he followup with his psychiatrist as soon as possible to have these symptoms evaluated and perhaps be started on a different regimen. We informed him that many of his symptoms are most likely associated with this problem as opposed to HIV. Especially given both his virologic and immunologic status.  Followup will be contingent upon plans set with the research team. He verbally acknowledged this and agreed with plan of care.

## 2010-10-19 ENCOUNTER — Encounter: Payer: Self-pay | Admitting: *Deleted

## 2010-10-19 NOTE — Progress Notes (Signed)
Alan Rangel was randomized today on the START study to the early treatment arm. He will need to come in and talk with [facility] about treatment options. I have scheduled him for an appt. with Alan Rangel this Monday at 2:30pm.

## 2010-10-23 ENCOUNTER — Ambulatory Visit: Payer: Medicaid Other | Admitting: Adult Health

## 2010-10-23 ENCOUNTER — Ambulatory Visit: Payer: Medicaid Other | Admitting: Internal Medicine

## 2010-10-25 ENCOUNTER — Encounter: Payer: Self-pay | Admitting: Adult Health

## 2010-10-25 LAB — CD4/CD8 (T-HELPER/T-SUPPRESSOR CELL)
CD4%: 35.2
CD4: 704
CD8: 640
CD8: 881

## 2010-10-27 ENCOUNTER — Ambulatory Visit (INDEPENDENT_AMBULATORY_CARE_PROVIDER_SITE_OTHER): Payer: Medicaid Other | Admitting: *Deleted

## 2010-10-27 DIAGNOSIS — B20 Human immunodeficiency virus [HIV] disease: Secondary | ICD-10-CM

## 2010-10-30 MED ORDER — ISENTRESS 400 MG PO TABS
400.0000 mg | ORAL_TABLET | Freq: Two times a day (BID) | ORAL | Status: DC
Start: 2010-10-30 — End: 2011-03-06

## 2010-10-30 MED ORDER — EMTRICITABINE-TENOFOVIR DF 200-300 MG PO TABS: 1.0000 | ORAL_TABLET | Freq: Every day | ORAL | Status: DC

## 2010-10-30 NOTE — Progress Notes (Signed)
Unique came in today to get started on HIV meds. Alan Rangel had requested that he be started on Truvada and Isentress. We discussed possible side effects, adherence, dosing instructions and when he needed to call us for problems. I gave him some literature on the meds and a pill box to use. He also spoke with Tammy, the pharmacist about the medication. He planned on starting them tonight. He has had multiple changes with his psych meds and I  told him he needed to give them some time to work before he stopped taking new meds. I also told him it would be a good idea if he could keep a record of how he feels on the different meds. He still seemed somewhat anxious and is taking xanax as needed. He has a new apartment now and is hoping that stability and privacy will help him feel safe. He is to return in 4 weeks for followup.

## 2010-11-23 ENCOUNTER — Encounter: Payer: Medicaid Other | Admitting: *Deleted

## 2010-12-11 ENCOUNTER — Ambulatory Visit (INDEPENDENT_AMBULATORY_CARE_PROVIDER_SITE_OTHER): Payer: Medicaid Other | Admitting: *Deleted

## 2010-12-11 VITALS — BP 131/91 | HR 61 | Temp 97.7°F | Resp 16 | Wt 239.5 lb

## 2010-12-11 DIAGNOSIS — B20 Human immunodeficiency virus [HIV] disease: Secondary | ICD-10-CM

## 2010-12-11 LAB — POCT URINALYSIS DIPSTICK
Blood, UA: NEGATIVE
Glucose, UA: NEGATIVE
Nitrite, UA: NEGATIVE
Protein, UA: NEGATIVE
Urobilinogen, UA: 0.2

## 2010-12-11 LAB — BASIC METABOLIC PANEL
BUN: 10 mg/dL (ref 6–23)
Calcium: 9.5 mg/dL (ref 8.4–10.5)
Potassium: 4.2 mEq/L (ref 3.5–5.3)
Sodium: 140 mEq/L (ref 135–145)

## 2010-12-11 NOTE — Progress Notes (Signed)
Alan Rangel is here for his 1 month visit. He had missed his scheduled visit 2 weeks ago because he said he was in the hospital at [facility] for anxiety during that time. He said he had a panic attack that would not subside and that was why he was admitted. He had run out of his HIV meds 10 days ago, but took them almost everyday prior to that. He c/o lowerback pain associated with an old injury with some nerve pain down his legs.  He does report some new sx of abnormal dreams, not sleeping well, blurred vision and dizziness and is not sure if that is related to his HIV meds or new Psych meds.  His psych MD has prescribed neurontin for generalized tingling and aching, which he has not started yet. He is applying for disability through social services and meets with a psychologist on Wednesday for the eval for that.He will return in 3 months for followup.. I instructed him to call for any changes in meds or

## 2010-12-12 LAB — HIV-1 RNA QUANT-NO REFLEX-BLD
HIV 1 RNA Quant: 169 copies/mL — ABNORMAL HIGH (ref <20)
HIV-1 RNA Quant, Log: 2.23 {Log} — ABNORMAL HIGH (ref <1.30)

## 2010-12-22 ENCOUNTER — Encounter: Payer: Self-pay | Admitting: Internal Medicine

## 2010-12-22 LAB — CD4/CD8 (T-HELPER/T-SUPPRESSOR CELL): CD4%: 35.3

## 2011-01-05 ENCOUNTER — Emergency Department (HOSPITAL_COMMUNITY)
Admission: EM | Admit: 2011-01-05 | Discharge: 2011-01-10 | Disposition: A | Discharge status: Home / Self Care | Payer: Medicaid Other | Attending: Emergency Medicine | Admitting: Emergency Medicine

## 2011-01-05 DIAGNOSIS — Z79899 Other long term (current) drug therapy: Secondary | ICD-10-CM | POA: Insufficient documentation

## 2011-01-05 DIAGNOSIS — F410 Panic disorder [episodic paroxysmal anxiety] without agoraphobia: Secondary | ICD-10-CM | POA: Insufficient documentation

## 2011-01-05 DIAGNOSIS — R45851 Suicidal ideations: Secondary | ICD-10-CM | POA: Insufficient documentation

## 2011-01-05 DIAGNOSIS — Z21 Asymptomatic human immunodeficiency virus [HIV] infection status: Secondary | ICD-10-CM | POA: Insufficient documentation

## 2011-01-05 DIAGNOSIS — K089 Disorder of teeth and supporting structures, unspecified: Secondary | ICD-10-CM | POA: Insufficient documentation

## 2011-01-05 DIAGNOSIS — F411 Generalized anxiety disorder: Secondary | ICD-10-CM | POA: Insufficient documentation

## 2011-01-05 DIAGNOSIS — F29 Unspecified psychosis not due to a substance or known physiological condition: Secondary | ICD-10-CM | POA: Insufficient documentation

## 2011-01-05 LAB — COMPREHENSIVE METABOLIC PANEL
ALT: 23 U/L (ref 0–53)
BUN: 16 mg/dL (ref 6–23)
CO2: 29 mEq/L (ref 19–32)
Calcium: 10 mg/dL (ref 8.4–10.5)
Creatinine, Ser: 1.26 mg/dL (ref 0.50–1.35)
GFR calc Af Amer: >60 mL/min (ref >60)
GFR calc non Af Amer: >60 mL/min (ref >60)
Glucose, Bld: 94 mg/dL (ref 70–99)
Sodium: 137 mEq/L (ref 135–145)

## 2011-01-05 LAB — DIFFERENTIAL
Lymphocytes Relative: 48 % — ABNORMAL HIGH (ref 12–46)
Lymphs Abs: 3.5 10*3/uL (ref 0.7–4.0)
Monocytes Absolute: 0.5 10*3/uL (ref 0.1–1.0)
Monocytes Relative: 6 % (ref 3–12)
Neutro Abs: 3.2 10*3/uL (ref 1.7–7.7)
Neutrophils Relative %: 44 % (ref 43–77)

## 2011-01-05 LAB — CBC
HCT: 50.2 % (ref 39.0–52.0)
Hemoglobin: 18.1 g/dL — ABNORMAL HIGH (ref 13.0–17.0)
MCH: 31.9 pg (ref 26.0–34.0)
MCHC: 36.1 g/dL — ABNORMAL HIGH (ref 30.0–36.0)
MCV: 88.4 fL (ref 78.0–100.0)
RBC: 5.68 MIL/uL (ref 4.22–5.81)

## 2011-01-05 LAB — RAPID URINE DRUG SCREEN, HOSP PERFORMED
Barbiturates: NOT DETECTED
Cocaine: NOT DETECTED

## 2011-01-05 LAB — ETHANOL: Alcohol, Ethyl (B): <11 mg/dL (ref 0–11)

## 2011-02-07 LAB — POCT I-STAT, CHEM 8
Chloride: 100
Glucose, Bld: 88
HCT: 50
Hemoglobin: 17
Potassium: 4.2
Sodium: 137

## 2011-02-07 LAB — RAPID STREP SCREEN (MED CTR MEBANE ONLY): Streptococcus, Group A Screen (Direct): NEGATIVE

## 2011-02-07 LAB — CBC
HCT: 47.4
Hemoglobin: 16.3
MCV: 93.5
RDW: 12.5

## 2011-02-07 LAB — DIFFERENTIAL
Basophils Absolute: 0
Basophils Relative: 0
Eosinophils Absolute: 0
Monocytes Relative: 13 — ABNORMAL HIGH
Neutro Abs: 3.5

## 2011-03-06 ENCOUNTER — Ambulatory Visit (INDEPENDENT_AMBULATORY_CARE_PROVIDER_SITE_OTHER): Payer: Medicaid Other | Admitting: Infectious Disease

## 2011-03-06 ENCOUNTER — Encounter: Payer: Self-pay | Admitting: *Deleted

## 2011-03-06 ENCOUNTER — Ambulatory Visit (INDEPENDENT_AMBULATORY_CARE_PROVIDER_SITE_OTHER): Payer: Medicaid Other | Admitting: *Deleted

## 2011-03-06 ENCOUNTER — Encounter: Payer: Self-pay | Admitting: Infectious Disease

## 2011-03-06 VITALS — BP 131/91 | HR 80 | Temp 98.4°F | Resp 18 | Wt 262.0 lb

## 2011-03-06 DIAGNOSIS — F411 Generalized anxiety disorder: Secondary | ICD-10-CM

## 2011-03-06 DIAGNOSIS — G4700 Insomnia, unspecified: Secondary | ICD-10-CM

## 2011-03-06 DIAGNOSIS — R55 Syncope and collapse: Secondary | ICD-10-CM

## 2011-03-06 DIAGNOSIS — Z62810 Personal history of physical and sexual abuse in childhood: Secondary | ICD-10-CM | POA: Insufficient documentation

## 2011-03-06 DIAGNOSIS — F449 Dissociative and conversion disorder, unspecified: Secondary | ICD-10-CM | POA: Insufficient documentation

## 2011-03-06 DIAGNOSIS — F410 Panic disorder [episodic paroxysmal anxiety] without agoraphobia: Secondary | ICD-10-CM

## 2011-03-06 DIAGNOSIS — F4310 Post-traumatic stress disorder, unspecified: Secondary | ICD-10-CM

## 2011-03-06 DIAGNOSIS — F3289 Other specified depressive episodes: Secondary | ICD-10-CM

## 2011-03-06 DIAGNOSIS — IMO0002 Reserved for concepts with insufficient information to code with codable children: Secondary | ICD-10-CM

## 2011-03-06 DIAGNOSIS — R110 Nausea: Secondary | ICD-10-CM

## 2011-03-06 DIAGNOSIS — F329 Major depressive disorder, single episode, unspecified: Secondary | ICD-10-CM

## 2011-03-06 DIAGNOSIS — B20 Human immunodeficiency virus [HIV] disease: Secondary | ICD-10-CM

## 2011-03-06 DIAGNOSIS — K219 Gastro-esophageal reflux disease without esophagitis: Secondary | ICD-10-CM

## 2011-03-06 DIAGNOSIS — R079 Chest pain, unspecified: Secondary | ICD-10-CM | POA: Insufficient documentation

## 2011-03-06 LAB — POCT URINALYSIS DIPSTICK
Bilirubin, UA: NEGATIVE
Glucose, UA: NEGATIVE
Leukocytes, UA: NEGATIVE
Nitrite, UA: NEGATIVE
Urobilinogen, UA: 0.2

## 2011-03-06 MED ORDER — RITONAVIR 100 MG PO TABS: 100.0000 mg | ORAL_TABLET | Freq: Every day | ORAL | Status: DC

## 2011-03-06 MED ORDER — OMEPRAZOLE 20 MG PO CPDR: 20.0000 mg | DELAYED_RELEASE_CAPSULE | Freq: Every day | ORAL | Status: DC

## 2011-03-06 MED ORDER — LORAZEPAM 0.5 MG PO TABS: 0.5000 mg | ORAL_TABLET | Freq: Two times a day (BID) | ORAL | Status: AC | PRN

## 2011-03-06 MED ORDER — DARUNAVIR ETHANOLATE 400 MG PO TABS: 800.0000 mg | ORAL_TABLET | Freq: Every day | ORAL | Status: DC

## 2011-03-06 MED ORDER — ONDANSETRON HCL 4 MG PO TABS: 4.0000 mg | ORAL_TABLET | Freq: Three times a day (TID) | ORAL | Status: AC | PRN

## 2011-03-06 NOTE — Assessment & Plan Note (Signed)
zofran

## 2011-03-06 NOTE — Assessment & Plan Note (Signed)
Poorly controlled and his life is disorganized. Need VERY close monitoring by [facility] in [place]. I will also discuss with our Bridge COUnselor to see if there are interventions in pts home to help with compliance with ARV and with his depression/anxiety./panic attacks. He needs CBT and frequent therapy in my opninion as well

## 2011-03-06 NOTE — Patient Instructions (Signed)
YOUR NEW REGIMEN IS  PREZISTA 400MG  TAKE TWO ORANGE TABLETS =800MG  DAILY WITH NORVIR 100MG  ONE WHITE  TABLET DAILY WITH  TRUVADA ONE BLUE TABLET DAILY  YOU MAY WANT TO TAKE ZOFRAN 4MG  30 MINUTES BEFORE THESE MEDS

## 2011-03-06 NOTE — Assessment & Plan Note (Signed)
Due to panic attacks, atypical

## 2011-03-06 NOTE — Assessment & Plan Note (Signed)
Needs titration of his benzodiazepenes
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
## 2011-03-06 NOTE — Assessment & Plan Note (Signed)
Makes me very concern about ability to be adherent, therefore change to PI regimen

## 2011-03-06 NOTE — Assessment & Plan Note (Signed)
See above discussion

## 2011-03-06 NOTE — Assessment & Plan Note (Signed)
Change to prezista norvir and truvada

## 2011-03-06 NOTE — Progress Notes (Signed)
03/06/2011 @ 1200pm: Pt here for research study START, month #4. Pt was just seen by Dr. Daiva Eves today for anxiety issues. Due to concern with adherence to ARV's, MD switch his medications from Truvada and Isentress, to Truvada, Norvir, and Prezista to decrease risk of resistance. Pt understood and stated he "felt better" knowing this. Other than dealing with anxiety he denies any other problems or symptoms. I discussed the importance of adhering to his ARV regimen as well as his psychiatric medications and he verbalized understanding. Fasting labs were drawn, vital signs are stable. Pt received $20.00 gift card for study visit. Next appointment scheduled for Jun 19, 2011 at 10am. Pt has made a f/u appointment with Dr. Daiva Eves in 1 month. At this time we will check to see how he is adhering to his ARV regimen. -- Tacey Heap RN

## 2011-03-06 NOTE — Assessment & Plan Note (Signed)
prilosec

## 2011-03-07 LAB — BASIC METABOLIC PANEL
Calcium: 9.2 mg/dL (ref 8.4–10.5)
Creat: 1.02 mg/dL (ref 0.50–1.35)
Sodium: 140 mEq/L (ref 135–145)

## 2011-03-08 LAB — HIV-1 RNA QUANT-NO REFLEX-BLD
HIV 1 RNA Quant: 23 copies/mL — ABNORMAL HIGH (ref <20)
HIV-1 RNA Quant, Log: 1.36 {Log} — ABNORMAL HIGH (ref <1.30)

## 2011-04-09 ENCOUNTER — Ambulatory Visit: Payer: Medicaid Other | Admitting: Adult Health

## 2011-04-11 ENCOUNTER — Ambulatory Visit: Payer: Medicaid Other | Admitting: Internal Medicine

## 2011-04-23 ENCOUNTER — Other Ambulatory Visit: Payer: Self-pay | Admitting: *Deleted

## 2011-04-23 ENCOUNTER — Encounter: Payer: Self-pay | Admitting: Internal Medicine

## 2011-04-23 ENCOUNTER — Ambulatory Visit (INDEPENDENT_AMBULATORY_CARE_PROVIDER_SITE_OTHER): Payer: Medicaid Other | Admitting: Internal Medicine

## 2011-04-23 VITALS — BP 137/89 | HR 84 | Temp 98.5°F | Wt 263.0 lb

## 2011-04-23 DIAGNOSIS — K047 Periapical abscess without sinus: Secondary | ICD-10-CM

## 2011-04-23 MED ORDER — AMOXICILLIN 500 MG PO CAPS: 500.0000 mg | ORAL_CAPSULE | Freq: Three times a day (TID) | ORAL | Status: DC

## 2011-04-23 MED ORDER — OXYCODONE-ACETAMINOPHEN 5-325 MG PO TABS: 1.0000 | ORAL_TABLET | Freq: Two times a day (BID) | ORAL | Status: AC | PRN

## 2011-04-23 MED ORDER — AMOXICILLIN 500 MG PO CAPS: 500.0000 mg | ORAL_CAPSULE | Freq: Three times a day (TID) | ORAL | Status: AC

## 2011-04-23 MED ORDER — OXYCODONE-ACETAMINOPHEN 5-325 MG PO TABS: 1.0000 | ORAL_TABLET | Freq: Two times a day (BID) | ORAL | Status: DC | PRN

## 2011-04-23 NOTE — Telephone Encounter (Signed)
Dr snider unable to print Rx at this time.

## 2011-05-21 ENCOUNTER — Other Ambulatory Visit: Payer: Self-pay | Admitting: *Deleted

## 2011-05-21 DIAGNOSIS — B20 Human immunodeficiency virus [HIV] disease: Secondary | ICD-10-CM

## 2011-05-21 DIAGNOSIS — K219 Gastro-esophageal reflux disease without esophagitis: Secondary | ICD-10-CM

## 2011-05-21 MED ORDER — OMEPRAZOLE 20 MG PO CPDR: 20.0000 mg | DELAYED_RELEASE_CAPSULE | Freq: Every day | ORAL | Status: DC
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
## 2011-06-15 ENCOUNTER — Other Ambulatory Visit: Payer: Self-pay | Admitting: Infectious Disease

## 2011-06-21 ENCOUNTER — Ambulatory Visit (INDEPENDENT_AMBULATORY_CARE_PROVIDER_SITE_OTHER): Payer: Medicaid Other | Admitting: *Deleted

## 2011-06-21 VITALS — BP 127/84 | HR 70 | Temp 98.1°F | Resp 16 | Wt 273.0 lb

## 2011-06-21 DIAGNOSIS — Z21 Asymptomatic human immunodeficiency virus [HIV] infection status: Secondary | ICD-10-CM

## 2011-06-21 DIAGNOSIS — B20 Human immunodeficiency virus [HIV] disease: Secondary | ICD-10-CM

## 2011-06-21 LAB — POCT URINALYSIS DIPSTICK
Bilirubin, UA: NEGATIVE
Blood, UA: NEGATIVE
Leukocytes, UA: NEGATIVE
Nitrite, UA: NEGATIVE
Protein, UA: NEGATIVE
pH, UA: 6

## 2011-06-21 LAB — BASIC METABOLIC PANEL
BUN: 9 mg/dL (ref 6–23)
CO2: 26 mEq/L (ref 19–32)
Calcium: 9.2 mg/dL (ref 8.4–10.5)
Chloride: 105 mEq/L (ref 96–112)
Creat: 1.14 mg/dL (ref 0.50–1.35)

## 2011-06-21 NOTE — Progress Notes (Signed)
Patient is here for his 8 month START study visit. He says he has been fairly adherent with his meds since he started the new regimen in October. He has not noticed any new side effects from those meds, but has had his psych meds changed around recently and is concerned about the moderate amount of weight gain he has noticed since he started zyprexa. He is going to let his psychiatrist know about the weight gain. He has also noticed increase in thirst. He continues to have frequent panic attacks and was hospitalizied in [place] for 2-3 days last week for one. He will return in 4 months for the next visit.

## 2011-06-25 LAB — HIV-1 RNA QUANT-NO REFLEX-BLD: HIV-1 RNA Quant, Log: <1.3 {Log} (ref <1.30)

## 2011-07-06 ENCOUNTER — Encounter: Payer: Self-pay | Admitting: Internal Medicine

## 2011-07-06 LAB — CD4/CD8 (T-HELPER/T-SUPPRESSOR CELL)
CD4%: 33.8
CD4%: 36.8
CD8: 775
CD8: 686

## 2011-07-12 ENCOUNTER — Encounter: Payer: Self-pay | Admitting: Infectious Disease

## 2011-08-22 ENCOUNTER — Telehealth: Payer: Self-pay | Admitting: *Deleted

## 2011-08-22 NOTE — Telephone Encounter (Signed)
Alan Rangel called and said he has lost his Medicaid, because he had an increase in his disability pay. He will be eligible for medicare part B/D in January. I instructed him to come see Britta Mccreedy and sign up for [employer] to help cover his visits  And other labs. He is receiving his ARVs on study so he will not need ADAP currently. He said they are helping him get his mental health meds at the [facility]. He is currently on geodon, cogentin and zoloft. He is concerned about his BP being high. He said they checked it at the center and it was elevated. He will schedule an appt. To see the physician here soon.

## 2011-09-18 ENCOUNTER — Ambulatory Visit: Payer: Medicaid Other

## 2011-10-17 ENCOUNTER — Ambulatory Visit: Payer: Medicaid Other

## 2011-10-22 ENCOUNTER — Ambulatory Visit (INDEPENDENT_AMBULATORY_CARE_PROVIDER_SITE_OTHER): Payer: Self-pay | Admitting: *Deleted

## 2011-10-22 ENCOUNTER — Other Ambulatory Visit: Payer: Self-pay | Admitting: Internal Medicine

## 2011-10-22 ENCOUNTER — Encounter: Payer: Self-pay | Admitting: *Deleted

## 2011-10-22 VITALS — BP 139/95 | HR 77 | Temp 98.0°F | Resp 16 | Wt 265.2 lb

## 2011-10-22 DIAGNOSIS — Z21 Asymptomatic human immunodeficiency virus [HIV] infection status: Secondary | ICD-10-CM

## 2011-10-22 DIAGNOSIS — B20 Human immunodeficiency virus [HIV] disease: Secondary | ICD-10-CM

## 2011-10-22 LAB — POCT URINALYSIS DIPSTICK
Bilirubin, UA: NEGATIVE
Glucose, UA: NEGATIVE
Ketones, UA: NEGATIVE
Leukocytes, UA: NEGATIVE
Nitrite, UA: NEGATIVE
pH, UA: 6

## 2011-10-22 LAB — COMPREHENSIVE METABOLIC PANEL
ALT: 26 U/L (ref 0–53)
AST: 17 U/L (ref 0–37)
Albumin: 4.5 g/dL (ref 3.5–5.2)
Alkaline Phosphatase: 125 U/L — ABNORMAL HIGH (ref 39–117)
Calcium: 9.6 mg/dL (ref 8.4–10.5)
Chloride: 104 mEq/L (ref 96–112)
Potassium: 4.2 mEq/L (ref 3.5–5.3)
Sodium: 142 mEq/L (ref 135–145)
Total Protein: 7.2 g/dL (ref 6.0–8.3)

## 2011-10-22 LAB — LIPID PANEL

## 2011-10-22 NOTE — Progress Notes (Signed)
Patient here for his 12 month START study appointment. He continues to have multiple mental health issues, particularly panic attacks which he says are occurring about every other day especially at night. He has trouble sleeping because he worries about having a panic attack. He misses doses of meds once in awhile, but attributes this to his sleeping schedule. Mental health recently stopped his klonopin and put him on vistaril which is not working for him. He has made multiple trips to the [facility]  ED for panic attacks, but has not been admitted recently. He will return in October for the next study appt.

## 2011-10-23 LAB — HIV-1 RNA QUANT-NO REFLEX-BLD
HIV 1 RNA Quant: <20 copies/mL (ref <20)
HIV-1 RNA Quant, Log: <1.3 {Log} (ref <1.30)

## 2011-10-24 LAB — LDL CHOLESTEROL, DIRECT: Direct LDL: 89 mg/dL

## 2011-10-24 NOTE — Addendum Note (Signed)
Addended by: Phill Myron on: 10/24/2011 09:13 AM   Modules accepted: Orders

## 2011-11-08 ENCOUNTER — Encounter: Payer: Self-pay | Admitting: Infectious Disease

## 2011-11-08 LAB — CD4/CD8 (T-HELPER/T-SUPPRESSOR CELL)
CD4: 1014
CD8 % Suppressor T Cell: 25.2
CD8: 706

## 2011-11-16 ENCOUNTER — Encounter: Payer: Self-pay | Admitting: Infectious Disease

## 2012-01-01 ENCOUNTER — Other Ambulatory Visit: Payer: Self-pay | Admitting: *Deleted

## 2012-01-01 DIAGNOSIS — B20 Human immunodeficiency virus [HIV] disease: Secondary | ICD-10-CM

## 2012-01-01 MED ORDER — DARUNAVIR ETHANOLATE 400 MG PO TABS: 800.0000 mg | ORAL_TABLET | Freq: Every day | ORAL | Status: DC

## 2012-01-01 MED ORDER — RITONAVIR 100 MG PO TABS: 100.0000 mg | ORAL_TABLET | Freq: Every day | ORAL | Status: DC

## 2012-01-01 NOTE — Telephone Encounter (Signed)
ADAP application Rx's 

## 2012-01-17 ENCOUNTER — Ambulatory Visit (INDEPENDENT_AMBULATORY_CARE_PROVIDER_SITE_OTHER): Payer: Self-pay | Admitting: Internal Medicine

## 2012-01-17 ENCOUNTER — Encounter: Payer: Self-pay | Admitting: Internal Medicine

## 2012-01-17 ENCOUNTER — Telehealth: Payer: Self-pay

## 2012-01-17 VITALS — BP 136/90 | HR 70 | Temp 98.1°F | Ht 73.0 in | Wt 277.2 lb

## 2012-01-17 DIAGNOSIS — R609 Edema, unspecified: Secondary | ICD-10-CM

## 2012-01-17 DIAGNOSIS — R600 Localized edema: Secondary | ICD-10-CM | POA: Insufficient documentation

## 2012-01-17 DIAGNOSIS — K0889 Other specified disorders of teeth and supporting structures: Secondary | ICD-10-CM

## 2012-01-17 DIAGNOSIS — K089 Disorder of teeth and supporting structures, unspecified: Secondary | ICD-10-CM

## 2012-01-17 MED ORDER — FUROSEMIDE 20 MG PO TABS: 20.0000 mg | ORAL_TABLET | Freq: Every day | ORAL | Status: DC

## 2012-01-17 MED ORDER — HYDROCODONE-ACETAMINOPHEN 7.5-500 MG PO TABS: 1.0000 | ORAL_TABLET | Freq: Three times a day (TID) | ORAL | Status: AC | PRN

## 2012-01-17 NOTE — Telephone Encounter (Signed)
[facility] calling, pt is in their office with bilateral foot swelling.  He is hardly able to walk.  Pt states he was seen in the ED at [facility] on Wednesday morning , ultrasound was done and pt was released and told to contact his primary care physician.   They are concerned .  Pt was given appointment with Dr Drue Second for today.  They will give patient a taxi voucher to transport him to our office and a PART pass for return to [employer].     Laurell Josephs, RN
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
## 2012-01-18 LAB — BASIC METABOLIC PANEL
CO2: 31 mEq/L (ref 19–32)
Calcium: 9.4 mg/dL (ref 8.4–10.5)
Chloride: 105 mEq/L (ref 96–112)
Creat: 1.06 mg/dL (ref 0.50–1.35)
Sodium: 140 mEq/L (ref 135–145)

## 2012-01-29 ENCOUNTER — Ambulatory Visit: Payer: Self-pay

## 2012-02-28 ENCOUNTER — Ambulatory Visit (INDEPENDENT_AMBULATORY_CARE_PROVIDER_SITE_OTHER): Payer: Self-pay | Admitting: *Deleted

## 2012-02-28 VITALS — BP 132/84 | HR 84 | Temp 98.1°F | Resp 16 | Wt 280.5 lb

## 2012-02-28 DIAGNOSIS — B20 Human immunodeficiency virus [HIV] disease: Secondary | ICD-10-CM

## 2012-02-28 LAB — CD4/CD8 (T-HELPER/T-SUPPRESSOR CELL)
CD4%: 37.8
CD4: 1512

## 2012-02-28 NOTE — Progress Notes (Signed)
Patient here for 16 month START study visit. He is currently ill with bronchitis and was seen in the [facility] ED this Monday. They gave him prescriptions which he is unable to pay for. He was given a breathing tx, solumedrol IV, albuterol inhaler and a couple of zithromax while there, but he now says he feels worse. He is very congested in his chest, coughing up slight tinged mucus, says he cannot sleep for coughing so much. Also having chills and sweats. I instructed him to get some OTC cough medicine like robitussin DM. His medicare advantage plan kicks in in 2 weeks and then he will have assist with prescriptions. Discussed with Dr. Drue Second who wrote him a prescription for Amox/Clav 875 mg BID for 7 days  which he can get filled now. He is off of all his psych meds except for the geodon and says they plan to start him on effexor next month. He has exp wheezes and rhonchi throughout his lungs. He continues to c/o anxiety, panic attacks but is not going to the ED as often for them. He said he an encounter( oral/anal) recently with a partner who said he had been treated for syphilis before the encounter and he wants to be checked for it. I told him that it could wait until he saw Dr. Drue Second in December and that it was very unlikely that he would have contracted it in that case. He will return for study in February. He does say he has occasionally missed his ARVs  Including a few this past week.

## 2012-02-29 LAB — HIV-1 RNA QUANT-NO REFLEX-BLD: HIV-1 RNA Quant, Log: <1.3 {Log} (ref <1.30)

## 2012-03-06 ENCOUNTER — Encounter: Payer: Self-pay | Admitting: Internal Medicine

## 2012-04-22 ENCOUNTER — Ambulatory Visit: Payer: Self-pay | Admitting: Internal Medicine

## 2012-05-19 ENCOUNTER — Telehealth: Payer: Self-pay | Admitting: *Deleted

## 2012-05-19 ENCOUNTER — Ambulatory Visit: Payer: Self-pay | Admitting: Internal Medicine

## 2012-05-19 NOTE — Telephone Encounter (Signed)
Generic message left on voicemail notifying patient of missed appointment and asking him to please reschedule at his earliest convenience. Andree Coss, RN

## 2012-06-24 ENCOUNTER — Ambulatory Visit: Payer: Medicare Other | Admitting: Internal Medicine

## 2012-06-24 ENCOUNTER — Telehealth: Payer: Self-pay | Admitting: *Deleted

## 2012-06-24 NOTE — Telephone Encounter (Signed)
Called patient to notify him of his missed appointment today. Left message asking him to call back and reschedule.  Patient has had several No Show appointments; will need to be referred to the walk in clinic for his next appointment. Andree Coss, RN

## 2012-07-11 ENCOUNTER — Ambulatory Visit (INDEPENDENT_AMBULATORY_CARE_PROVIDER_SITE_OTHER): Payer: Self-pay | Admitting: *Deleted

## 2012-07-11 ENCOUNTER — Other Ambulatory Visit: Payer: Self-pay | Admitting: Internal Medicine

## 2012-07-11 VITALS — BP 131/84 | HR 70 | Temp 98.6°F | Resp 18 | Wt 275.5 lb

## 2012-07-11 DIAGNOSIS — F410 Panic disorder [episodic paroxysmal anxiety] without agoraphobia: Secondary | ICD-10-CM

## 2012-07-11 DIAGNOSIS — Z21 Asymptomatic human immunodeficiency virus [HIV] infection status: Secondary | ICD-10-CM

## 2012-07-11 MED ORDER — CLONAZEPAM 1 MG PO TABS: 1.0000 mg | ORAL_TABLET | Freq: Two times a day (BID) | ORAL | Status: DC | PRN

## 2012-07-11 NOTE — Progress Notes (Signed)
Patient here for 20 month START study appt. He says that his new psychiatrist stopped all his psych meds because she was uncomfortable dosing any with his HIV meds. His old psychiatrist had moved away and had turned over care to this one. He had been hospitalized for behavioral health for 3 days around mid January and the outpatient psychiatrist he saw after that is the one who stopped the meds. He is trying to get in to see someone else, and he is having to switch groups anyway because of his insurance coverage. He has been having panic attacks everyday and reports feeling chest heaviness, sob, tingling all over and feeling like he is going to explode now. Dr. Drue Second prescribed him some klonopin to cover him until he can get in to see someone for psych issues. He has an appt. scheduled with her next week also. He says he occasionally misses his HIV meds about 2-3 week, mainly related to his psych problems.

## 2012-07-14 LAB — CD4/CD8 (T-HELPER/T-SUPPRESSOR CELL)
CD4: 1732
CD8 % Suppressor T Cell: 24

## 2012-07-15 LAB — HIV-1 RNA QUANT-NO REFLEX-BLD: HIV 1 RNA Quant: <20 copies/mL (ref <20)

## 2012-07-16 ENCOUNTER — Encounter: Payer: Self-pay | Admitting: Internal Medicine

## 2012-07-16 ENCOUNTER — Other Ambulatory Visit (HOSPITAL_COMMUNITY)
Admission: RE | Admit: 2012-07-16 | Discharge: 2012-07-16 | Disposition: A | Discharge status: Home / Self Care | Payer: Medicare PPO | Source: Ambulatory Visit | Attending: Internal Medicine | Admitting: Internal Medicine

## 2012-07-16 ENCOUNTER — Ambulatory Visit (INDEPENDENT_AMBULATORY_CARE_PROVIDER_SITE_OTHER): Payer: Medicare PPO | Admitting: Internal Medicine

## 2012-07-16 VITALS — BP 132/85 | HR 80 | Temp 97.9°F | Wt 279.0 lb

## 2012-07-16 DIAGNOSIS — B20 Human immunodeficiency virus [HIV] disease: Secondary | ICD-10-CM

## 2012-07-16 DIAGNOSIS — F410 Panic disorder [episodic paroxysmal anxiety] without agoraphobia: Secondary | ICD-10-CM

## 2012-07-16 DIAGNOSIS — Z0000 Encounter for general adult medical examination without abnormal findings: Secondary | ICD-10-CM

## 2012-07-16 DIAGNOSIS — Z113 Encounter for screening for infections with a predominantly sexual mode of transmission: Secondary | ICD-10-CM | POA: Insufficient documentation

## 2012-07-16 DIAGNOSIS — E781 Pure hyperglyceridemia: Secondary | ICD-10-CM

## 2012-07-16 LAB — LIPID PANEL
HDL: 29 mg/dL — ABNORMAL LOW (ref >39)
LDL Cholesterol: 155 mg/dL — ABNORMAL HIGH (ref 0–99)
Total CHOL/HDL Ratio: 7 Ratio
Triglycerides: 93 mg/dL (ref <150)

## 2012-07-16 MED ORDER — CLONAZEPAM 1 MG PO TABS: 1.0000 mg | ORAL_TABLET | Freq: Two times a day (BID) | ORAL | Status: DC | PRN

## 2012-07-16 MED ORDER — OMEGA-3 FATTY ACIDS 1000 MG PO CAPS: 2.0000 g | ORAL_CAPSULE | Freq: Every day | ORAL | Status: DC

## 2012-07-16 NOTE — Progress Notes (Signed)
[facility]  RFV: routine visit > 6 mo Subjective:    Patient ID: Alan Rangel, male    DOB: 05/21/1975, 37 y.o.   MRN: [id number]  HPI 37yo Male, with HIV and depression, CD 4 count of 1512/VL <20, on truvada/DRVr, excellent adherence. He reports that he was hospitalized in January at [facility] due to uncontrolled panic attacks. He was originally on effexor but then had frequent bouts where providers hospitalized him and attempted to switch to another agent. Per the patient's report, he is not on anything except klonopin which helps him greatly. He is looking for new psychiatrist due to change in health insurance. He has had various medications in the past without success. He currently takes 1.5-2mg  of klonopin a day, but feels not well controlled. Poor sleep, vomiting due to anxiety. He would like to have better improvement of his symptoms so that he can attend school in May.   Current Outpatient Prescriptions on File Prior to Visit  Medication Sig Dispense Refill  . clonazePAM (KLONOPIN) 1 MG tablet Take 1 tablet (1 mg total) by mouth 2 (two) times daily as needed for anxiety.  30 tablet  0  . darunavir (PREZISTA) 400 MG tablet Take 2 tablets (800 mg total) by mouth daily.  60 tablet  11  . ritonavir (NORVIR) 100 MG TABS Take 1 tablet (100 mg total) by mouth daily.  30 tablet  11  . ARIPiprazole (ABILIFY) 5 MG tablet Take 5 mg by mouth daily. Prescribed by mental health provider       . benztropine (COGENTIN) 1 MG tablet Take 1 mg by mouth 2 (two) times daily.        . furosemide (LASIX) 20 MG tablet Take 1 tablet (20 mg total) by mouth daily.  10 tablet  0  . gabapentin (NEURONTIN) 300 MG capsule Take 300 mg by mouth 3 (three) times daily.        Marland Kitchen omeprazole (PRILOSEC) 20 MG capsule TAKE 1 CAPSILE DAILY  30 capsule  6   No current facility-administered medications on file prior to visit.   Soc hx: will be going to [facility] [facility] [employer] in May; has not seen his family in [place] in 4.68yrs.   Review of  Systems  Constitutional: Negative for fever, chills, diaphoresis, activity change, appetite change, fatigue and unexpected weight change.  HENT: Negative for congestion, sore throat, rhinorrhea, sneezing, trouble swallowing and sinus pressure.  Eyes: Negative for photophobia and visual disturbance.  Respiratory: Negative for cough, chest tightness, shortness of breath, wheezing and stridor.  Cardiovascular: Negative for chest pain, palpitations and leg swelling.  Gastrointestinal: Negative for nausea, vomiting, abdominal pain, diarrhea, constipation, blood in stool, abdominal distention and anal bleeding.  Genitourinary: Negative for dysuria, hematuria, flank pain and difficulty urinating.  Musculoskeletal: Negative for myalgias, back pain, joint swelling, arthralgias and gait problem.  Skin: Negative for color change, pallor, rash and wound.  Neurological: Negative for dizziness, tremors, weakness and light-headedness.  Hematological: Negative for adenopathy. Does not bruise/bleed easily.  Psychiatric/Behavioral: per hpi        Objective:   Physical Exam BP 132/85  Pulse 80  Temp(Src) 97.9 F (36.6 C) (Oral)  Wt 279 lb (126.554 kg)  BMI 36.82 kg/m2 Physical Exam  Constitutional: He is oriented to person, place, and time. He appears well-developed and well-nourished. No distress.  HENT:  Mouth/Throat: Oropharynx is clear and moist. No oropharyngeal exudate.  Cardiovascular: Normal rate, regular rhythm and normal heart sounds. Exam reveals no gallop and no  friction rub.  No murmur heard.  Pulmonary/Chest: Effort normal and breath sounds normal. No respiratory distress. He has no wheezes.  Abdominal: Soft. Bowel sounds are normal. He exhibits no distension. There is no tenderness.  Lymphadenopathy:  He has no cervical adenopathy.  Neurological: He is alert and oriented to person, place, and time.  Skin: Skin is warm and dry. No rash noted. No erythema.  Psychiatric: He has a normal  mood and affect. His behavior is normal.          Assessment & Plan:  HIV= continue with current HIV regimen  Hypertriglyceridemia = will start fish oil, and check his lipid profile today to see if need to add fenofibrates  Anxiety/bipolar = did not tolerate effexor, buspar, prozac, wellbutrin. No longer on abilify. Has tried zyprexa, seroquel, and geodon without success. Still needs a psychatrist. Continues on klonopin. Psychiatry was thinking lamictal but was concern about the interaction with HIV meds. Will give patient contact info to contact us to verify no drug interaction.  - will continue to refill his klonopin 1mg  BID #60 NR until he gets established with psychiatrist  - will refer to behavior health at [facility] to see if can get psych if they take Alan Rangel  - he has psychologist already established  Health maintenance = will check RPR, lipid panel, urine GC/Chlam today; patient reports for having flu vaccine when he was hospitalized for ongoing panic attack in January 2014.  rtc in 3 months

## 2012-07-28 ENCOUNTER — Telehealth: Payer: Self-pay

## 2012-07-28 ENCOUNTER — Encounter: Payer: Self-pay | Admitting: Internal Medicine

## 2012-07-28 DIAGNOSIS — F410 Panic disorder [episodic paroxysmal anxiety] without agoraphobia: Secondary | ICD-10-CM

## 2012-07-28 MED ORDER — CLONAZEPAM 1 MG PO TABS: 1.0000 mg | ORAL_TABLET | Freq: Two times a day (BID) | ORAL | Status: DC | PRN

## 2012-07-28 NOTE — Telephone Encounter (Signed)
Pt calling for refill of klonopin.   Medication sent to pharmacy  Tomasita Morrow, RN .

## 2012-08-28 ENCOUNTER — Other Ambulatory Visit: Payer: Self-pay | Admitting: *Deleted

## 2012-08-28 DIAGNOSIS — F410 Panic disorder [episodic paroxysmal anxiety] without agoraphobia: Secondary | ICD-10-CM

## 2012-08-28 MED ORDER — CLONAZEPAM 1 MG PO TABS: 1.0000 mg | ORAL_TABLET | Freq: Two times a day (BID) | ORAL | Status: DC | PRN

## 2012-08-28 NOTE — Addendum Note (Signed)
Addended by: Phill Myron on: 08/28/2012 08:26 AM   Modules accepted: Orders

## 2012-08-28 NOTE — Telephone Encounter (Signed)
Patient calling for refill of clonazepam. Called in to [facility]

## 2012-09-22 NOTE — Telephone Encounter (Signed)
Can you refill under my name. Thank you. If you think he is reliable. Can do addn 3 months of refills

## 2012-10-15 ENCOUNTER — Other Ambulatory Visit: Payer: Medicare PPO

## 2012-11-11 ENCOUNTER — Ambulatory Visit: Payer: Medicare PPO | Admitting: Internal Medicine

## 2012-11-12 ENCOUNTER — Telehealth: Payer: Self-pay | Admitting: *Deleted

## 2012-11-12 NOTE — Telephone Encounter (Signed)
Called patient about his missed appt and his number was not a working number.

## 2012-12-03 ENCOUNTER — Ambulatory Visit (INDEPENDENT_AMBULATORY_CARE_PROVIDER_SITE_OTHER): Payer: Medicare PPO | Admitting: *Deleted

## 2012-12-03 ENCOUNTER — Ambulatory Visit (INDEPENDENT_AMBULATORY_CARE_PROVIDER_SITE_OTHER): Payer: Medicare PPO | Admitting: Internal Medicine

## 2012-12-03 ENCOUNTER — Encounter: Payer: Self-pay | Admitting: Internal Medicine

## 2012-12-03 VITALS — BP 130/88 | HR 64 | Temp 97.9°F | Wt 283.0 lb

## 2012-12-03 VITALS — BP 134/84 | HR 64 | Temp 97.9°F | Resp 16 | Ht 73.0 in | Wt 283.5 lb

## 2012-12-03 DIAGNOSIS — K047 Periapical abscess without sinus: Secondary | ICD-10-CM

## 2012-12-03 DIAGNOSIS — B20 Human immunodeficiency virus [HIV] disease: Secondary | ICD-10-CM

## 2012-12-03 DIAGNOSIS — F410 Panic disorder [episodic paroxysmal anxiety] without agoraphobia: Secondary | ICD-10-CM

## 2012-12-03 DIAGNOSIS — Z21 Asymptomatic human immunodeficiency virus [HIV] infection status: Secondary | ICD-10-CM

## 2012-12-03 DIAGNOSIS — F411 Generalized anxiety disorder: Secondary | ICD-10-CM

## 2012-12-03 LAB — POCT URINALYSIS DIPSTICK
Bilirubin, UA: NEGATIVE
Ketones, UA: NEGATIVE
Leukocytes, UA: NEGATIVE
Nitrite, UA: NEGATIVE
Protein, UA: NEGATIVE
pH, UA: 5.5

## 2012-12-03 LAB — LIPID PANEL
HDL: 30 mg/dL — ABNORMAL LOW (ref >39)
LDL Cholesterol: 148 mg/dL — ABNORMAL HIGH (ref 0–99)
Total CHOL/HDL Ratio: 6.9 Ratio

## 2012-12-03 LAB — COMPREHENSIVE METABOLIC PANEL
ALT: 33 U/L (ref 0–53)
AST: 21 U/L (ref 0–37)
Albumin: 4.3 g/dL (ref 3.5–5.2)
Alkaline Phosphatase: 137 U/L — ABNORMAL HIGH (ref 39–117)
Calcium: 9.4 mg/dL (ref 8.4–10.5)
Chloride: 107 mEq/L (ref 96–112)
Potassium: 4.3 mEq/L (ref 3.5–5.3)
Sodium: 140 mEq/L (ref 135–145)

## 2012-12-03 MED ORDER — CLONAZEPAM 1 MG PO TABS: 1.0000 mg | ORAL_TABLET | Freq: Two times a day (BID) | ORAL | Status: DC | PRN

## 2012-12-03 NOTE — Progress Notes (Signed)
Patient here for 24 month START study visit. He denies any new problems but does say he is still having anxiety and panic attacks, wanting klonopin refilled. He said he spent one night in the ED recently at [facility] for a panic attack with elevated BP. He seems calmer than usual and has been going to school at the community [employer] which keeps him busy. He has still not found a psychiatrist and was referred to [facility] on [address]. He reports having low back pain and occasionally will have nerve/muscle pain in his arms and legs. He is concerned about how much weight he has gained and we discussed dietary habits to promote weight loss. Apparently he has been eating one big meal day and admits to eating a pint of ice cream at one time. He will return in October for the next visit.
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
## 2012-12-04 LAB — HIV-1 RNA QUANT-NO REFLEX-BLD
HIV 1 RNA Quant: <20 copies/mL (ref <20)
HIV-1 RNA Quant, Log: <1.3 {Log} (ref <1.30)

## 2012-12-17 ENCOUNTER — Encounter: Payer: Self-pay | Admitting: Internal Medicine

## 2012-12-17 LAB — CD4/CD8 (T-HELPER/T-SUPPRESSOR CELL)
CD4: 1409
CD8 % Suppressor T Cell: 19.5

## 2012-12-25 ENCOUNTER — Encounter: Payer: Self-pay | Admitting: Internal Medicine

## 2013-03-05 ENCOUNTER — Telehealth: Payer: Self-pay | Admitting: *Deleted

## 2013-03-05 DIAGNOSIS — Z21 Asymptomatic human immunodeficiency virus [HIV] infection status: Secondary | ICD-10-CM

## 2013-03-05 DIAGNOSIS — F410 Panic disorder [episodic paroxysmal anxiety] without agoraphobia: Secondary | ICD-10-CM

## 2013-03-05 DIAGNOSIS — K047 Periapical abscess without sinus: Secondary | ICD-10-CM

## 2013-03-05 MED ORDER — CLONAZEPAM 1 MG PO TABS: 1.0000 mg | ORAL_TABLET | Freq: Two times a day (BID) | ORAL | Status: DC | PRN

## 2013-03-05 NOTE — Telephone Encounter (Signed)
Patient called and wanted refill for clonazepam for panic attacks. He said he has a psych intake at [facility] on November 2nd. He will be seeing his parents this weekend and it has accelerated his panic attacks. He said otherwise he was doing well in school and trying to work out often.

## 2013-03-06 NOTE — Telephone Encounter (Signed)
Go ahead and prescribe clonazepam for him. Would do 3 wks to cover him up until his intake

## 2013-03-09 ENCOUNTER — Ambulatory Visit: Payer: Medicare PPO | Admitting: Internal Medicine

## 2013-03-11 ENCOUNTER — Ambulatory Visit (INDEPENDENT_AMBULATORY_CARE_PROVIDER_SITE_OTHER): Payer: Medicare HMO | Admitting: *Deleted

## 2013-03-11 ENCOUNTER — Ambulatory Visit: Payer: Medicare PPO | Admitting: Internal Medicine

## 2013-03-11 VITALS — BP 129/86 | HR 80 | Temp 98.0°F | Resp 16 | Wt 278.2 lb

## 2013-03-11 DIAGNOSIS — Z21 Asymptomatic human immunodeficiency virus [HIV] infection status: Secondary | ICD-10-CM

## 2013-03-11 DIAGNOSIS — B20 Human immunodeficiency virus [HIV] disease: Secondary | ICD-10-CM

## 2013-03-12 LAB — CD4/CD8 (T-HELPER/T-SUPPRESSOR CELL)
CD4%: 40.2
CD8 % Suppressor T Cell: 19.6
CD8: 882

## 2013-03-12 NOTE — Progress Notes (Signed)
Patient here for month 28 START study visit. He continues to complain of anxiety, mood swings and panic attacks with numbness/tingling in his extremities. He says he has been very adherent with taking his antiretrovirals. He is now in school, works out regularly and this has been improving his quality of life. He has made an appt at [facility] for next week but is concerned about the 150$ they want from him upfront before they see him. His copays are 45$ with his medicare, Part C plan. He is on a very limited budget getting by on disability social security and says he only has 90$ left over for this coming month for extra expenses. I told him I would check and see if we could help him out at the clinic here, that we did have a fund for patients for needs like this. He was very unhappy with the care he was getting at [facility] and could not keep a consistent provider and thought he could do better himself but knows he needs help with medications. He will return to see [place] in 2013/07/06. He passed on his flushot today and says he will get one when he sees Dr. Drue Second next.

## 2013-03-17 ENCOUNTER — Encounter: Payer: Self-pay | Admitting: Internal Medicine

## 2013-03-17 ENCOUNTER — Ambulatory Visit (INDEPENDENT_AMBULATORY_CARE_PROVIDER_SITE_OTHER): Payer: Commercial Managed Care - HMO | Admitting: Internal Medicine

## 2013-03-17 VITALS — BP 137/81 | HR 85 | Temp 98.2°F | Ht 72.0 in | Wt 278.0 lb

## 2013-03-17 DIAGNOSIS — Z23 Encounter for immunization: Secondary | ICD-10-CM
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
## 2013-04-01 ENCOUNTER — Encounter: Payer: Self-pay | Admitting: Internal Medicine

## 2013-04-07 ENCOUNTER — Other Ambulatory Visit: Payer: Self-pay

## 2013-04-07 DIAGNOSIS — F410 Panic disorder [episodic paroxysmal anxiety] without agoraphobia: Secondary | ICD-10-CM

## 2013-04-07 DIAGNOSIS — Z21 Asymptomatic human immunodeficiency virus [HIV] infection status: Secondary | ICD-10-CM

## 2013-04-07 DIAGNOSIS — K047 Periapical abscess without sinus: Secondary | ICD-10-CM

## 2013-04-07 MED ORDER — CLONAZEPAM 1 MG PO TABS: 1.0000 mg | ORAL_TABLET | Freq: Two times a day (BID) | ORAL | Status: DC | PRN

## 2013-05-15 ENCOUNTER — Telehealth: Payer: Self-pay | Admitting: *Deleted

## 2013-05-15 DIAGNOSIS — Z21 Asymptomatic human immunodeficiency virus [HIV] infection status: Secondary | ICD-10-CM

## 2013-05-15 DIAGNOSIS — K047 Periapical abscess without sinus: Secondary | ICD-10-CM

## 2013-05-15 DIAGNOSIS — F410 Panic disorder [episodic paroxysmal anxiety] without agoraphobia: Secondary | ICD-10-CM

## 2013-05-15 MED ORDER — CLONAZEPAM 1 MG PO TABS: 1.0000 mg | ORAL_TABLET | Freq: Two times a day (BID) | ORAL | Status: DC | PRN

## 2013-05-15 NOTE — Telephone Encounter (Signed)
Please refill his klonopin

## 2013-05-15 NOTE — Telephone Encounter (Signed)
Patient called wanting his klonopin refilled. He has been trying to get established with a psychiatrist who can prescribe meds that is covered by his insurance [facility]([employer]), but is having trouble finding anyone.. He has seen a regular primary care physician who also wanted him to see a psychiatrist. He is going to see a new counselor who he hopes can refer him to a psychiatrist. I told him we cvould refill it again for now and that I would let Dr. Drue SecondSnider know.

## 2013-05-16 ENCOUNTER — Other Ambulatory Visit: Payer: Self-pay | Admitting: Internal Medicine

## 2013-05-16 DIAGNOSIS — F411 Generalized anxiety disorder: Secondary | ICD-10-CM

## 2013-05-18 ENCOUNTER — Other Ambulatory Visit: Payer: Self-pay | Admitting: *Deleted

## 2013-05-18 DIAGNOSIS — F411 Generalized anxiety disorder: Secondary | ICD-10-CM

## 2013-05-18 MED ORDER — CLONAZEPAM 1 MG PO TABS: 1.0000 mg | ORAL_TABLET | Freq: Two times a day (BID) | ORAL | Status: DC | PRN

## 2013-05-18 NOTE — Telephone Encounter (Signed)
Alan FearingJames called and said that [facility] that [facility] had not received the refill authorization on Friday. I called it in also and left a voicemail today as well as sending it via fax.

## 2013-06-17 ENCOUNTER — Ambulatory Visit (INDEPENDENT_AMBULATORY_CARE_PROVIDER_SITE_OTHER): Payer: Medicare PPO | Admitting: Infectious Disease

## 2013-06-17 ENCOUNTER — Ambulatory Visit (INDEPENDENT_AMBULATORY_CARE_PROVIDER_SITE_OTHER): Payer: Medicare HMO | Admitting: *Deleted

## 2013-06-17 ENCOUNTER — Encounter: Payer: Self-pay | Admitting: Infectious Disease

## 2013-06-17 ENCOUNTER — Other Ambulatory Visit: Payer: Medicare HMO

## 2013-06-17 VITALS — BP 144/85 | HR 59 | Temp 98.0°F | Wt 289.0 lb

## 2013-06-17 VITALS — BP 144/85 | HR 59 | Temp 98.0°F | Wt 289.2 lb

## 2013-06-17 DIAGNOSIS — R21 Rash and other nonspecific skin eruption: Secondary | ICD-10-CM

## 2013-06-17 DIAGNOSIS — R600 Localized edema: Secondary | ICD-10-CM

## 2013-06-17 DIAGNOSIS — Z21 Asymptomatic human immunodeficiency virus [HIV] infection status: Secondary | ICD-10-CM

## 2013-06-17 DIAGNOSIS — I10 Essential (primary) hypertension: Secondary | ICD-10-CM

## 2013-06-17 DIAGNOSIS — B20 Human immunodeficiency virus [HIV] disease: Secondary | ICD-10-CM

## 2013-06-17 DIAGNOSIS — R7309 Other abnormal glucose: Secondary | ICD-10-CM

## 2013-06-17 DIAGNOSIS — R3589 Other polyuria: Secondary | ICD-10-CM

## 2013-06-17 DIAGNOSIS — R358 Other polyuria: Secondary | ICD-10-CM

## 2013-06-17 DIAGNOSIS — R609 Edema, unspecified: Secondary | ICD-10-CM

## 2013-06-17 DIAGNOSIS — F410 Panic disorder [episodic paroxysmal anxiety] without agoraphobia: Secondary | ICD-10-CM | POA: Insufficient documentation

## 2013-06-17 DIAGNOSIS — B353 Tinea pedis: Secondary | ICD-10-CM

## 2013-06-17 LAB — HEMOGLOBIN A1C
HEMOGLOBIN A1C: 5.7 % — AB (ref <5.7)
Mean Plasma Glucose: 117 mg/dL — ABNORMAL HIGH (ref <117)

## 2013-06-17 MED ORDER — HYDROCHLOROTHIAZIDE 12.5 MG PO CAPS: 12.5000 mg | ORAL_CAPSULE | Freq: Every day | ORAL | Status: DC

## 2013-06-17 MED ORDER — FLUCONAZOLE 100 MG PO TABS: 100.0000 mg | ORAL_TABLET | Freq: Every day | ORAL | Status: DC

## 2013-06-17 MED ORDER — HYDROCHLOROTHIAZIDE 25 MG PO TABS: 25.0000 mg | ORAL_TABLET | Freq: Every day | ORAL | Status: DC

## 2013-06-17 NOTE — Patient Instructions (Signed)
I want you to start HCTZ 12.5mg  daily  I want you to take fluconazole 100mg  for 2 weeks (for rash)  I want you to try lamisil cream to foot (if already tried then try lotrimin instead)

## 2013-06-17 NOTE — Progress Notes (Signed)
Subjective:    Patient ID: Alan Rangel, male    DOB: [birth year], 38 y.o.   MRN: [id number]  Rash Pertinent negatives include no congestion, cough, diarrhea, fatigue, fever, rhinorrhea, shortness of breath, sore throat or vomiting.    38 year old [place] man with HIV that is perfectly controlled on a regimen of Prezista Norvir and Truvada which he receives via the START trial.  He presents to clinic today acutely due to several complaints.  First volume to waxing and waning lower extremity edema which she's had before. Additionally he is complaining of a rash on the bottom of his foot where he had previously been treated for tinea pedis.  He has tried counter topical drugs for this but he does not know what the names of these medicines are.  He's also had an area under his left armpit this is suspicious for possible fungal infection.  On close questioning he does endorse problems as well with nocturnal awakening snoring and sleep walking but his friends have noticed.  He continues to gain weight. He also is suffering from polyuria is concerned about possibility of diabetes.  Final he has had troubles with intermittent blurry vision and family members apparently have told him that his speech is slurred at times but it on the phone with them. He has no known history of strokes although there is a heavy history of hypertension and strokes in his family. He does also suffer from panic attacks in the scalpel it certainly interpretation of multiple symptoms that he is having.   Review of Systems  Constitutional: Negative for fever, chills, diaphoresis, activity change, appetite change, fatigue and unexpected weight change.  HENT: Negative for congestion, rhinorrhea, sinus pressure, sneezing, sore throat and trouble swallowing.   Eyes: Negative for photophobia and visual disturbance.  Respiratory: Negative for cough, chest tightness, shortness of breath, wheezing and stridor.     Cardiovascular: Positive for leg swelling. Negative for chest pain and palpitations.  Gastrointestinal: Negative for nausea, vomiting, abdominal pain, diarrhea, constipation, blood in stool, abdominal distention and anal bleeding.  Genitourinary: Negative for dysuria, hematuria, flank pain and difficulty urinating.  Musculoskeletal: Negative for arthralgias, back pain, gait problem and myalgias.  Skin: Positive for rash. Negative for color change, pallor and wound.  Neurological: Positive for speech difficulty. Negative for dizziness, tremors, weakness and light-headedness.  Hematological: Negative for adenopathy. Does not bruise/bleed easily.  Psychiatric/Behavioral: Negative for behavioral problems, confusion, sleep disturbance, dysphoric mood, decreased concentration and agitation.       Objective:   Physical Exam  Constitutional: He is oriented to person, place, and time. He appears well-developed and well-nourished. No distress.  HENT:  Head: Normocephalic and atraumatic.  Mouth/Throat: Oropharynx is clear and moist. No oropharyngeal exudate.  Eyes: Conjunctivae and EOM are normal. No scleral icterus.  Neck: Normal range of motion. Neck supple. No JVD present.  Cardiovascular: Normal rate, regular rhythm and normal heart sounds.  Exam reveals no gallop and no friction rub.   No murmur heard. Pulmonary/Chest: Effort normal and breath sounds normal. No respiratory distress. He has no wheezes. He has no rales. He exhibits no tenderness.  Abdominal: He exhibits no distension and no mass. There is no tenderness. There is no rebound and no guarding.  Musculoskeletal: He exhibits edema. He exhibits no tenderness.  Lymphadenopathy:    He has no cervical adenopathy.  Neurological: He is alert and oriented to person, place, and time. He exhibits normal muscle tone. Coordination normal.  Skin: Skin is warm  and dry. He is not diaphoretic. No erythema. No pallor.  See pictures regarding armpit  and foot  Psychiatric: He has a normal mood and affect. His behavior is normal. Judgment and thought content normal.   Armpit with hyperpigmentation worries had prior irritation possibly due to fungal infection    Right foot with raised erythematous scaling lesion:            Assessment & Plan:  HIV: Continue Prezista Norvir Truvada.  Rash on foot: Check RPR with basic labs today I suspect this may indeed also be a fungal infection I might give him a two-week supply of fluconazole. Also give him topical Lamisil.  I spent greater than with the patient including greater than 50% of time in face to face counsel of the patient and in coordination of their care.  Polyuria: Check A1c along with basic labs today I would not be surprised be suffering from diabetes mellitus.  Blurry vision could have multiple causes including uncontrolled hypertension as well as problems with panic attacks we'll initiate antihypertensive medication see below  Hypertension we'll start low-dose 12.5 mg of hydrochlorothiazide.  Nocturnal awakening with lower sternum edema and obesity: Patient is high-risk for having obstructive sleep apnea would benefit from a sleep study will reassess this and likely order such a study at next visit.  Panic attacks continue on current medications

## 2013-06-18 ENCOUNTER — Other Ambulatory Visit: Payer: Medicare PPO

## 2013-06-18 LAB — RPR

## 2013-06-18 LAB — HEPATITIS C ANTIBODY: HCV AB: NEGATIVE

## 2013-06-18 LAB — HEPATITIS B SURFACE ANTIGEN: Hepatitis B Surface Ag: NEGATIVE

## 2013-06-18 LAB — HEPATITIS B SURFACE ANTIBODY,QUALITATIVE: Hep B S Ab: POSITIVE — AB

## 2013-06-18 NOTE — Progress Notes (Signed)
Patient here for month 32 START study visit. He is c/o rash on his left foot similar to athlete's feet and his feet swelling. He has multiple complaints and is supposed to see Dr. Drue SecondSnider in a few weeks. We were able to get him in to see Dr. Daiva EvesVan Dam today.  BP up slightly and also having some weight gain, though he says he has been watching what he eats. He is still having problems getting established with a primary care physician and psychiatrist because of his insurance. They assigned him to an internist who is now leaving the practice. Still continues to have anxiety but has managed to take 5 classes in school this semester and getting good grades. He will return for research in June.

## 2013-06-19 LAB — HIV-1 RNA QUANT-NO REFLEX-BLD: HIV-1 RNA Quant, Log: <1.3 {Log} (ref <1.30)

## 2013-06-29 ENCOUNTER — Encounter: Payer: Self-pay | Admitting: Internal Medicine

## 2013-06-29 LAB — CD4/CD8 (T-HELPER/T-SUPPRESSOR CELL)
CD4%: 42.3
CD4: 1481
CD8 % Suppressor T Cell: 19.1
CD8: 669

## 2013-07-02 ENCOUNTER — Ambulatory Visit: Payer: Medicare PPO | Admitting: Internal Medicine

## 2013-07-16 ENCOUNTER — Ambulatory Visit: Payer: Medicare PPO | Admitting: Internal Medicine

## 2013-07-16 ENCOUNTER — Telehealth: Payer: Self-pay | Admitting: *Deleted

## 2013-07-16 NOTE — Telephone Encounter (Signed)
Called patient about no show and he advised his ride did not show. He also advised he will call back to schedule once he gets another ride. Advised the patient of the no show policy and that it may be better if he comes for labs and OV on the same day until he gets stable transportation. He advises he will think about it and give us a call back later as he was headed to class.

## 2013-07-23 ENCOUNTER — Telehealth: Payer: Self-pay | Admitting: *Deleted

## 2013-07-23 NOTE — Telephone Encounter (Signed)
Alan Rangel called and said Alan Rangel had a panic attack this week and his BP had gone up to 190/90 and Alan Rangel was concerned about this. Alan Rangel is on Hctz 25mg  only. Alan Rangel is very concerned about not being able to get with a general practitioner or psychiatrist who will prescribe psych meds because of his insurance. Alan Rangel is to see the psychiatrist in the morning. Alan Rangel says that she is not willing to prescribe any psych meds for him because of his HIV meds. I told him to have her call tomorrow to talk with Dr. Drue SecondSnider about it when Alan Rangel is there seeing her. Alan Rangel only has a few klonopin left and Alan Rangel is also concerned about getting refills on that. I scheduled an appt with Dr. Drue SecondSnider for him on Tuesday.

## 2013-07-23 NOTE — Telephone Encounter (Signed)
You can refill his klonopin until we see him

## 2013-07-24 ENCOUNTER — Other Ambulatory Visit: Payer: Self-pay | Admitting: *Deleted

## 2013-07-24 DIAGNOSIS — F411 Generalized anxiety disorder: Secondary | ICD-10-CM

## 2013-07-24 MED ORDER — CLONAZEPAM 1 MG PO TABS: 1.0000 mg | ORAL_TABLET | Freq: Two times a day (BID) | ORAL | Status: DC | PRN

## 2013-07-28 ENCOUNTER — Encounter: Payer: Self-pay | Admitting: Internal Medicine

## 2013-07-28 ENCOUNTER — Ambulatory Visit (INDEPENDENT_AMBULATORY_CARE_PROVIDER_SITE_OTHER): Payer: Medicare HMO | Admitting: Internal Medicine

## 2013-07-28 VITALS — BP 136/88 | HR 87 | Temp 97.3°F | Wt 289.0 lb

## 2013-07-28 DIAGNOSIS — F411 Generalized anxiety disorder: Secondary | ICD-10-CM

## 2013-07-28 MED ORDER — CLONAZEPAM 1 MG PO TABS: 1.0000 mg | ORAL_TABLET | Freq: Two times a day (BID) | ORAL | Status: DC | PRN

## 2013-07-28 NOTE — Progress Notes (Signed)
  [facility] - Pharmacist    HPI: Alan Rangel is a 38 y.o. male with HIV here for a follow up appointment. His HIV is undetectable, and CD4 count is 1481. He is currently on darunavir/ritonovir and Truvada.  Allergies: No Known Allergies  Vitals: Temp: 97.3 F (36.3 C) (03/17 1342) Temp src: Oral (03/17 1342) BP: 136/88 mmHg (03/17 1342) Pulse Rate: 87 (03/17 1342)  Past Medical History: Past Medical History  Diagnosis Date  . HIV (human immunodeficiency virus infection)   . Panic attack   . HTN (hypertension)     Social History: History   Social History  . Marital Status: Single    Spouse Name: N/A    Number of Children: N/A  . Years of Education: N/A   Social History Main Topics  . Smoking status: Current Every Day Smoker -- 0.50 packs/day for 10 years    Types: Cigarettes  . Smokeless tobacco: Never Used     Comment: would like help  . Alcohol Use: No     Comment: History of, but not currently  . Drug Use: No  . Sexual Activity: No     Comment: declined condoms   Other Topics Concern  . None   Social History Narrative  . None    Labs: HIV 1 RNA Quant (copies/mL)  Date Value  06/17/2013 <20   03/11/2013 <20   12/03/2012 <20      CD4 T Cell Abs (cmm)  Date Value  06/30/2010 810   03/09/2010 1050   09/27/2009 1150      Hep B S Ab (no units)  Date Value  06/17/2013 POS*     Hepatitis B Surface Ag (no units)  Date Value  06/17/2013 NEGATIVE      HCV Ab (no units)  Date Value  06/17/2013 NEGATIVE     Lipids:    Component Value Date/Time   CHOL 208* 12/03/2012 1053   TRIG 150* 12/03/2012 1053   HDL 30* 12/03/2012 1053   CHOLHDL 6.9 12/03/2012 1053   VLDL 30 12/03/2012 1053   LDLCALC 148* 12/03/2012 1053    Assessment: Alan Rangel describes no issues with his HIV regimen. I spoke with him regarding potential drug interactions, specifically between his HIV antiretrovirals and the Celexa he has been prescribed by his  psychiatrist for panic disorder. I explained to him there is the potential for Celexa levels to be increased due to inhibition of metabolism caused by the protease inhibitor. The most worrisome component of the interaction is the risk for QTc prolongation. His last EKG (done 11/2012) shows a QTc interval of 426. He has tried a multitude of different options, though none have seemed to help. I asked if he had tried Zoloft, as there is less potential for drug interactions, but he experienced severe cognitive dulling and fatigue and said he "just [name] out of it." As he has tried many different options and the fact that he is not on the maximum dose of Celexa, we discussed continuing with Celexa, and I suggested he ask his psychiatrist about EKG monitoring to ensure Celexa levels are not becoming too high and causing QTc prolongation.   Recommendations: Continue HIV regimen Continue Celexa with close monitoring of EKG  Shinita Mac C. Silva Aamodt, PharmD Clinical Pharmacist-Resident Pager: [phone] Pharmacy: [phone] 07/28/2013 2:37 PM

## 2013-07-28 NOTE — Progress Notes (Signed)
Subjective:    Patient ID: Alan Rangel, male    DOB: 07/14/1975, 38 y.o.   MRN: [id number]  HPI Alan Rangel in 38yo M with HIV, CD 4 count of 1430/VL<20, on truvada/DRVr via study. He went to the ED for panic attack. He saw a psychiatrist for management of panic disorder but not sure if they felt comfortable to call regarding any possibly drug interaction with hiv meds. His celexa increased to 30mg  daily. Uses klonopin for rescue from panic attack. Doing well in school, ends at end of April.   Current Outpatient Prescriptions on File Prior to Visit  Medication Sig Dispense Refill  . citalopram (CELEXA) 10 MG tablet Take 10-20 mg by mouth daily.      . clonazePAM (KLONOPIN) 1 MG tablet Take 1 tablet (1 mg total) by mouth 2 (two) times daily as needed for anxiety.  60 tablet  3  . Darunavir Ethanolate (PREZISTA) 800 MG tablet Take 800 mg by mouth.      Marland Kitchen emtricitabine-tenofovir (TRUVADA) 200-300 MG per tablet Take 1 tablet by mouth daily.      . fish oil-omega-3 fatty acids 1000 MG capsule Take 2 capsules (2 g total) by mouth daily.  100 capsule  3  . fluconazole (DIFLUCAN) 100 MG tablet Take 1 tablet (100 mg total) by mouth daily.  14 tablet  1  . hydrochlorothiazide (MICROZIDE) 12.5 MG capsule Take 1 capsule (12.5 mg total) by mouth daily.  30 capsule  11  . ritonavir (NORVIR) 100 MG TABS Take 1 tablet (100 mg total) by mouth daily.  30 tablet  11  . darunavir (PREZISTA) 400 MG tablet Take 2 tablets (800 mg total) by mouth daily.  60 tablet  11  . emtricitabine-tenofovir (TRUVADA) 200-300 MG per tablet Take 1 tablet by mouth daily.  30 tablet  11   No current facility-administered medications on file prior to visit.     Review of Systems  Constitutional: Negative for fever, chills, diaphoresis, activity change, appetite change, fatigue and unexpected weight change.  HENT: Negative for congestion, sore throat, rhinorrhea, sneezing, trouble swallowing and sinus pressure.  Eyes: Negative for  photophobia and visual disturbance.  Respiratory: Negative for cough, chest tightness, shortness of breath, wheezing and stridor.  Cardiovascular: Negative for chest pain, palpitations and leg swelling.  Gastrointestinal: Negative for nausea, vomiting, abdominal pain, diarrhea, constipation, blood in stool, abdominal distention and anal bleeding.  Genitourinary: Negative for dysuria, hematuria, flank pain and difficulty urinating.  Musculoskeletal: Negative for myalgias, back pain, joint swelling, arthralgias and gait problem.  Skin: Negative for color change, pallor, rash and wound.  Neurological: Negative for dizziness, tremors, weakness and light-headedness.  Hematological: Negative for adenopathy. Does not bruise/bleed easily.  Psychiatric/Behavioral: Negative for behavioral problems, confusion, sleep disturbance, dysphoric mood, decreased concentration and agitation.       Objective:   Physical Exam BP 136/88  Pulse 87  Temp(Src) 97.3 F (36.3 C) (Oral)  Wt 289 lb (131.09 kg) Physical Exam  Constitutional: He is oriented to person, place, and time. He appears well-developed and well-nourished. No distress.  HENT:  Mouth/Throat: Oropharynx is clear and moist. No oropharyngeal exudate.  Cardiovascular: Normal rate, regular rhythm and normal heart sounds. Exam reveals no gallop and no friction rub.  No murmur heard.  Pulmonary/Chest: Effort normal and breath sounds normal. No respiratory distress. He has no wheezes.  Abdominal: Soft. Bowel sounds are normal. He exhibits no distension. There is no tenderness.  Lymphadenopathy:  He has no cervical adenopathy.  Neurological: He is alert and oriented to person, place, and time.  Skin: Skin is warm and dry. No rash noted. No erythema.  Psychiatric: He has a normal mood and affect. His behavior is normal.       Assessment & Plan:  HIV = well controlled, continue on truvada/DRVr  Panic disorder/PTSD = seeing a psychiatrist for  celexa 30mg  daily. But we will give rx for klonopin for rescue   HTN = continue with hctz 25mg  daily doing well at goal. Has occ high bp with anxiety attacks.  rtc in 3 months  [facility] therapist - [phone]. Dr. Reece LeaderBjlec or Dr "B"   New cell: [phone]

## 2013-08-17 ENCOUNTER — Other Ambulatory Visit: Payer: Self-pay | Admitting: *Deleted

## 2013-08-17 DIAGNOSIS — F411 Generalized anxiety disorder: Secondary | ICD-10-CM

## 2013-08-17 MED ORDER — CLONAZEPAM 1 MG PO TABS: 1.0000 mg | ORAL_TABLET | Freq: Two times a day (BID) | ORAL | Status: DC | PRN

## 2013-08-18 ENCOUNTER — Ambulatory Visit: Payer: Medicare PPO | Admitting: Internal Medicine

## 2013-10-29 ENCOUNTER — Ambulatory Visit (INDEPENDENT_AMBULATORY_CARE_PROVIDER_SITE_OTHER): Payer: Self-pay | Admitting: *Deleted

## 2013-10-29 ENCOUNTER — Ambulatory Visit (INDEPENDENT_AMBULATORY_CARE_PROVIDER_SITE_OTHER): Payer: Medicare HMO | Admitting: Internal Medicine

## 2013-10-29 ENCOUNTER — Encounter: Payer: Self-pay | Admitting: Internal Medicine

## 2013-10-29 VITALS — BP 122/85 | HR 74 | Temp 97.9°F | Resp 16 | Wt 277.0 lb

## 2013-10-29 DIAGNOSIS — F4323 Adjustment disorder with mixed anxiety and depressed mood: Secondary | ICD-10-CM

## 2013-10-29 DIAGNOSIS — R209 Unspecified disturbances of skin sensation: Secondary | ICD-10-CM

## 2013-10-29 DIAGNOSIS — B20 Human immunodeficiency virus [HIV] disease: Secondary | ICD-10-CM

## 2013-10-29 DIAGNOSIS — R200 Anesthesia of skin: Secondary | ICD-10-CM

## 2013-10-29 DIAGNOSIS — Z21 Asymptomatic human immunodeficiency virus [HIV] infection status: Secondary | ICD-10-CM

## 2013-10-29 LAB — COMPREHENSIVE METABOLIC PANEL
ALBUMIN: 4.8 g/dL (ref 3.5–5.2)
ALT: 21 U/L (ref 0–53)
AST: 18 U/L (ref 0–37)
Alkaline Phosphatase: 119 U/L — ABNORMAL HIGH (ref 39–117)
BUN: 11 mg/dL (ref 6–23)
CALCIUM: 9.4 mg/dL (ref 8.4–10.5)
CHLORIDE: 101 meq/L (ref 96–112)
CO2: 27 meq/L (ref 19–32)
Creat: 1.2 mg/dL (ref 0.50–1.35)
Glucose, Bld: 120 mg/dL — ABNORMAL HIGH (ref 70–99)
POTASSIUM: 3.5 meq/L (ref 3.5–5.3)
Sodium: 138 mEq/L (ref 135–145)
Total Bilirubin: 0.4 mg/dL (ref 0.2–1.2)
Total Protein: 7.4 g/dL (ref 6.0–8.3)

## 2013-10-29 LAB — CD4/CD8 (T-HELPER/T-SUPPRESSOR CELL)
CD4%: 41.2
CD4: 1607
CD8 % Suppressor T Cell: 17
CD8: 663

## 2013-10-29 LAB — POCT URINALYSIS DIPSTICK
Bilirubin, UA: NEGATIVE
Blood, UA: NEGATIVE
Glucose, UA: NEGATIVE
KETONES UA: NEGATIVE
Leukocytes, UA: NEGATIVE
Nitrite, UA: NEGATIVE
PH UA: 6
PROTEIN UA: 30
Spec Grav, UA: >=1.03
Urobilinogen, UA: 0.2

## 2013-10-29 LAB — LIPID PANEL
CHOL/HDL RATIO: 7 ratio
Cholesterol: 204 mg/dL — ABNORMAL HIGH (ref 0–200)
HDL: 29 mg/dL — ABNORMAL LOW (ref >39)
LDL Cholesterol: 150 mg/dL — ABNORMAL HIGH (ref 0–99)
TRIGLYCERIDES: 127 mg/dL (ref <150)
VLDL: 25 mg/dL (ref 0–40)

## 2013-10-29 MED ORDER — LORAZEPAM 1 MG PO TABS: 1.0000 mg | ORAL_TABLET | Freq: Two times a day (BID) | ORAL | Status: DC | PRN
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
## 2013-10-30 ENCOUNTER — Telehealth: Payer: Self-pay | Admitting: *Deleted

## 2013-10-30 NOTE — Telephone Encounter (Signed)
[name] called and wanted to know if he was supposed to keep taking his BP med. He said he had talked to Dr. Drue SecondSnider about it yesterday and he was also concerned about his potassium level. Yesterday's blood draw showed a potassium of 3.5 and that his BP was 122 systolic without taking his BP med that morning. I told him he could hold off on it this weekend and then get back with Dr. Drue SecondSnider the first of the week.

## 2013-10-30 NOTE — Progress Notes (Signed)
Alan Rangel is here for START, 36 month. New symptoms of left arm, left face, and left leg numbness and weakness around Oct 16, 2013. He went to [facility] and ruled out stroke. ED MD questioning MS and sent him home with prescription for tramadol and a referral to neurology. Symptoms lasted a few days but currently have resolved. Neurology appt pending and he is to see a psychiatrist at [facility] in July. Questionnaires completed and EKG performed. Study ARV medications and $20 gift card for visit given to participant. Will call him in October to schedule hi next appointment. Tacey HeapElisha Epperson RN

## 2013-11-03 LAB — HIV-1 RNA QUANT-NO REFLEX-BLD: HIV 1 RNA Quant: <20 copies/mL (ref <20)

## 2013-11-19 ENCOUNTER — Telehealth: Payer: Self-pay | Admitting: *Deleted

## 2013-11-19 NOTE — Telephone Encounter (Signed)
Patient called and was asking about his neurology referral from Dr. Drue SecondSnider. The referral had been started on July 18th.

## 2013-11-19 NOTE — Telephone Encounter (Signed)
Patient called to check on his neurology referral and I advised him information sent and just waiting to hear back. Advised called and was told they are working on it just no available appts right now but soon. The patient was ok with this answer and advised him i will check back in a week to see progress so to call then if he has not heard anything.

## 2013-12-01 ENCOUNTER — Encounter: Payer: Self-pay | Admitting: Neurology

## 2013-12-01 ENCOUNTER — Ambulatory Visit (INDEPENDENT_AMBULATORY_CARE_PROVIDER_SITE_OTHER): Payer: Medicare HMO | Admitting: Neurology

## 2013-12-01 VITALS — BP 130/81 | HR 72 | Ht 72.0 in | Wt 281.0 lb

## 2013-12-01 DIAGNOSIS — R55 Syncope and collapse: Secondary | ICD-10-CM

## 2013-12-01 DIAGNOSIS — R209 Unspecified disturbances of skin sensation: Secondary | ICD-10-CM

## 2013-12-01 NOTE — Patient Instructions (Signed)
Syncope °Syncope is a medical term for fainting or passing out. This means you lose consciousness and drop to the ground. People are generally unconscious for less than 5 minutes. You may have some muscle twitches for up to 15 seconds before waking up and returning to normal. Syncope occurs more often in older adults, but it can happen to anyone. While most causes of syncope are not dangerous, syncope can be a sign of a serious medical problem. It is important to seek medical care.  °CAUSES  °Syncope is caused by a sudden drop in blood flow to the brain. The specific cause is often not determined. Factors that can bring on syncope include: °· Taking medicines that lower blood pressure. °· Sudden changes in posture, such as standing up quickly. °· Taking more medicine than prescribed. °· Standing in one place for too long. °· Seizure disorders. °· Dehydration and excessive exposure to heat. °· Low blood sugar (hypoglycemia). °· Straining to have a bowel movement. °· Heart disease, irregular heartbeat, or other circulatory problems. °· Fear, emotional distress, seeing blood, or severe pain. °SYMPTOMS  °Right before fainting, you may: °· Feel dizzy or light-headed. °· Feel nauseous. °· See all white or all black in your field of vision. °· Have cold, clammy skin. °DIAGNOSIS  °Your health care provider will ask about your symptoms, perform a physical exam, and perform an electrocardiogram (ECG) to record the electrical activity of your heart. Your health care provider may also perform other heart or blood tests to determine the cause of your syncope which may include: °· Transthoracic echocardiogram (TTE). During echocardiography, sound waves are used to evaluate how blood flows through your heart. °· Transesophageal echocardiogram (TEE). °· Cardiac monitoring. This allows your health care provider to monitor your heart rate and rhythm in real time. °· Holter monitor. This is a portable device that records your  heartbeat and can help diagnose heart arrhythmias. It allows your health care provider to track your heart activity for several days, if needed. °· Stress tests by exercise or by giving medicine that makes the heart beat faster. °TREATMENT  °In most cases, no treatment is needed. Depending on the cause of your syncope, your health care provider may recommend changing or stopping some of your medicines. °HOME CARE INSTRUCTIONS °· Have someone stay with you until you feel stable. °· Do not drive, use machinery, or play sports until your health care provider says it is okay. °· Keep all follow-up appointments as directed by your health care provider. °· Lie down right away if you start feeling like you might faint. Breathe deeply and steadily. Wait until all the symptoms have passed. °· Drink enough fluids to keep your urine clear or pale yellow. °· If you are taking blood pressure or heart medicine, get up slowly and take several minutes to sit and then stand. This can reduce dizziness. °SEEK IMMEDIATE MEDICAL CARE IF:  °· You have a severe headache. °· You have unusual pain in the chest, abdomen, or back. °· You are bleeding from your mouth or rectum, or you have black or tarry stool. °· You have an irregular or very fast heartbeat. °· You have pain with breathing. °· You have repeated fainting or seizure-like jerking during an episode. °· You faint when sitting or lying down. °· You have confusion. °· You have trouble walking. °· You have severe weakness. °· You have vision problems. °If you fainted, call your local emergency services (911 in [place] Do not drive   yourself to the hospital.  °MAKE SURE YOU: °· Understand these instructions. °· Will watch your condition. °· Will get help right away if you are not doing well or get worse. °Document Released: 04/30/2005 Document Revised: 05/05/2013 Document Reviewed: 06/29/2011 °ExitCare® Patient Information ©2015 [employer]. This information is not intended to replace  advice given to you by your health care provider. Make sure you discuss any questions you have with your health care provider. ° °

## 2013-12-01 NOTE — Progress Notes (Addendum)
Reason for visit: Numbness, weakness  Alan Rangel is a 38 y.o. male  History of present illness:  [name] is a 38 year old right-handed black male with a history of a panic disorder and an HIV infection. The patient comes into the office with a multitude of various complaints. He indicates that he is having daily panic attacks and he will be seen through psychiatry within the next week. He indicates he has had episodes of syncope dating back to 2010. He has had at least 3 CT brain scan evaluations for syncope or headache, with the last scan in 2011, all were within normal limits. The has had more recent problems within the last month or 6 weeks of episodes of tremors that may precede or follow episodes of sudden onset of right-sided numbness of the face, arm, and leg associated with weakness and inability to use the left arm. The patient may have some electric shock sensations in the chest area. He indicates that he went [facility], and MRI evaluation of the brain was done. This study was unremarkable according to the patient. The results of this study are not available to me. The patient has some occasional episodes of double vision and altered vision. He does have history of headaches. He reports some pain in the low back area. He is sent to this office for further evaluation.  Past Medical History  Diagnosis Date  . HIV (human immunodeficiency virus infection)   . Panic attack   . HTN (hypertension)     Past Surgical History  Procedure Laterality Date  . Skin graft Right     foot/leg    Family History  Problem Relation Age of Onset  . Sinusitis Mother   . Mental illness Father   . Hypertension Maternal Grandmother   . Diabetes Maternal Grandmother     Social history:  reports that he has been smoking Cigarettes.  He has a 5 pack-year smoking history. He has never used smokeless tobacco. He reports that he does not drink alcohol or use illicit  drugs.  Medications:  Current Outpatient Prescriptions on File Prior to Visit  Medication Sig Dispense Refill  . citalopram (CELEXA) 10 MG tablet Take 10 mg by mouth daily.       . Darunavir Ethanolate (PREZISTA) 800 MG tablet Take 800 mg by mouth.      Marland Kitchen. emtricitabine-tenofovir (TRUVADA) 200-300 MG per tablet Take 1 tablet by mouth daily.      . fish oil-omega-3 fatty acids 1000 MG capsule Take 2 capsules (2 g total) by mouth daily.  100 capsule  3  . ritonavir (NORVIR) 100 MG TABS Take 1 tablet (100 mg total) by mouth daily.  30 tablet  11   No current facility-administered medications on file prior to visit.     No Known Allergies  ROS:  Out of a complete 14 system review of symptoms, the patient complains only of the following symptoms, and all other reviewed systems are negative.  Fevers, chills, fatigue Chest pain, palpitations of the heart, swelling in the legs Ringing in the ears, spinning sensations Moles Blurred vision, loss of vision, eye pain  Shortness of breath, cough, wheezing, snoring Diarrhea Impotence Easy bruising Feeling hot, cold, increased thirst, flushing Joint pain, achy muscles Allergies, skin sensitivity Memory loss, confusion, headache, numbness, weakness, slurred speech, and passing out, tremor Depression, anxiety, too much sleep, not enough sleep, and decreased energy, disinterest in activities, racing thoughts Restless legs  Blood pressure  130/81, pulse 72, height 6' (1.829 m), weight 281 lb (127.461 kg).  Physical Exam  General: The patient is alert and cooperative at the time of the examination. The patient is moderately obese.  Eyes: Pupils are equal, round, and reactive to light. Discs are flat bilaterally.  Neck: The neck is supple, no carotid bruits are noted.  Respiratory: The respiratory examination is clear.  Cardiovascular: The cardiovascular examination reveals a regular rate and rhythm, no obvious murmurs or rubs are  noted.  Skin: Extremities are without significant edema.  Neurologic Exam  Mental status: The patient is alert and oriented x 3 at the time of the examination. The patient has apparent normal recent and remote memory, with an apparently normal attention span and concentration ability.  Cranial nerves: Facial symmetry is present. There is good sensation of the face to pinprick and soft touch bilaterally. The strength of the facial muscles and the muscles to head turning and shoulder shrug are normal bilaterally. Speech is well enunciated, no aphasia or dysarthria is noted. Extraocular movements are full. Visual fields are full. The tongue is midline, and the patient has symmetric elevation of the soft palate. No obvious hearing deficits are noted.  Motor: The motor testing reveals 5 over 5 strength of all 4 extremities. Good symmetric motor tone is noted throughout.  Sensory: Sensory testing is intact to pinprick, soft touch, vibration sensation, and position sense on all 4 extremities, with exception that there is a decrease in pinprick sensation on the left arm . No evidence of extinction is noted.  Coordination: Cerebellar testing reveals good finger-nose-finger and heel-to-shin bilaterally.  Gait and station: Gait is normal. Tandem gait is normal. Romberg is negative. No drift is seen.  Reflexes: Deep tendon reflexes are symmetric and normal bilaterally. Toes are downgoing bilaterally.   Assessment/Plan:  1. Panic disorder  2. HIV infection  3. History syncope  4. Episodic numbness on all fours, left-sided weakness  The patient has an unremarkable clinical examination today. The patient has a multitude of somatic complaints which could be primarily psychiatric in nature. We will need to followup with the results of the MRI of the brain that was done. The patient will undergo an EEG study. If the studies are unremarkable, I will not pursue any further neurologic workup. The patient  indicates that he has had a thyroid study done recently as a workup for the tremor.   A We have received a report of the scan, this was actually a CT scan, and not an MRI scan of the brain. The study was done without contrast, and was unremarkable. This study was done on 10/17/2013.  Marlan Palau MD 12/01/2013 8:15 PM  [facility] [address] [place], [place] [address]  Phone [phone] Fax [phone]

## 2013-12-03 ENCOUNTER — Encounter: Payer: Self-pay | Admitting: Internal Medicine

## 2013-12-21 ENCOUNTER — Other Ambulatory Visit: Payer: Commercial Managed Care - HMO

## 2014-01-26 ENCOUNTER — Ambulatory Visit (INDEPENDENT_AMBULATORY_CARE_PROVIDER_SITE_OTHER): Payer: Self-pay | Admitting: *Deleted

## 2014-01-26 VITALS — BP 136/95 | HR 65 | Temp 97.8°F | Resp 16 | Wt 272.5 lb

## 2014-01-26 DIAGNOSIS — B20 Human immunodeficiency virus [HIV] disease: Secondary | ICD-10-CM

## 2014-01-26 DIAGNOSIS — Z006 Encounter for examination for normal comparison and control in clinical research program: Secondary | ICD-10-CM

## 2014-01-26 NOTE — Progress Notes (Signed)
Braun is here for START study, week 40. He states he is feeling ore optimistic and actually cares about his appearance. He is down 5 lbs from last visit and is happy about that. He states his medications have changed: He no longer takes celexa and started abilify and Wellbutrin. Still c/o his ongoing symptoms. He is being evaluated by neuro. He is to see Dr. Drue Second next week and he wants to discuss possibility of switching his HIV regimen to a single-tablet-regimen. He feels there is a pill burden and if this was lightened it would contribute to helping his mood. Non fasting labs were drawn. Blood pressure elevated and he is asymptomatic. Study medication was dispensed at this visit. Next appointment scheduled for May 20, 2014 @ 11am. Tacey Heap RN

## 2014-01-27 LAB — HIV-1 RNA QUANT-NO REFLEX-BLD

## 2014-02-01 ENCOUNTER — Other Ambulatory Visit: Payer: Medicare HMO

## 2014-02-02 ENCOUNTER — Ambulatory Visit (INDEPENDENT_AMBULATORY_CARE_PROVIDER_SITE_OTHER): Payer: Medicare HMO | Admitting: Internal Medicine

## 2014-02-02 ENCOUNTER — Encounter: Payer: Self-pay | Admitting: Internal Medicine

## 2014-02-02 VITALS — BP 136/87 | HR 78 | Temp 97.8°F | Wt 295.0 lb

## 2014-02-02 DIAGNOSIS — J069 Acute upper respiratory infection, unspecified: Secondary | ICD-10-CM

## 2014-02-02 DIAGNOSIS — B20 Human immunodeficiency virus [HIV] disease: Secondary | ICD-10-CM
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
## 2014-02-08 ENCOUNTER — Encounter: Payer: Self-pay | Admitting: Internal Medicine

## 2014-02-08 ENCOUNTER — Ambulatory Visit: Payer: Medicare HMO

## 2014-02-17 ENCOUNTER — Encounter: Payer: Self-pay | Admitting: Internal Medicine

## 2014-02-17 LAB — CD4/CD8 (T-HELPER/T-SUPPRESSOR CELL)
CD4%: 41.8
CD4: 1254
CD8 % Suppressor T Cell: 16.7
CD8: 501

## 2014-02-24 ENCOUNTER — Encounter: Payer: Self-pay | Admitting: Internal Medicine

## 2014-02-25 ENCOUNTER — Other Ambulatory Visit: Payer: Self-pay | Admitting: *Deleted

## 2014-02-25 DIAGNOSIS — Z21 Asymptomatic human immunodeficiency virus [HIV] infection status: Secondary | ICD-10-CM

## 2014-02-25 MED ORDER — ELVITEG-COBIC-EMTRICIT-TENOFDF 150-150-200-300 MG PO TABS: 1.0000 | ORAL_TABLET | Freq: Every day | ORAL | Status: DC

## 2014-03-04 ENCOUNTER — Other Ambulatory Visit: Payer: Medicare HMO

## 2014-04-05 ENCOUNTER — Other Ambulatory Visit: Payer: Medicare HMO

## 2014-05-04 ENCOUNTER — Ambulatory Visit: Payer: Medicare HMO | Admitting: Internal Medicine

## 2014-06-09 ENCOUNTER — Ambulatory Visit (INDEPENDENT_AMBULATORY_CARE_PROVIDER_SITE_OTHER): Payer: Self-pay | Admitting: *Deleted

## 2014-06-09 VITALS — BP 126/84 | HR 76 | Temp 98.1°F | Resp 18 | Wt 311.0 lb

## 2014-06-09 DIAGNOSIS — Z006 Encounter for examination for normal comparison and control in clinical research program: Secondary | ICD-10-CM

## 2014-06-09 NOTE — Progress Notes (Signed)
Alan Rangel is here for his month 6744 START study visit. He has put on quite a bit of weight and attributes that to his psych meds. He started seroquel in December, but when we discussed his diet he seems to be consuming quite a bit of carbs. We discussed a healthy diet and exercising routinely. I told him to write down what he eats so he can realize what he is actually eating and get more control over it. He is concerned about becoming diabetic. He does c/o blurry vision at times and having to urinate frequently. He now has a  Regular PCP but has only seen them once or twice and hasn't mentioned that to them. He continues to have multiple neurological complaints and said that psych doctor was considering referring him to a neurologist. He says he has been stressed out with school and exams lately and missed his last appt with Dr. [name]. He is scheduled to see her in March and research again in April.

## 2014-06-10 LAB — HIV-1 RNA QUANT-NO REFLEX-BLD
HIV 1 RNA QUANT: 23 {copies}/mL — AB (ref <20)
HIV-1 RNA Quant, Log: 1.36 {Log} — ABNORMAL HIGH (ref <1.30)

## 2014-06-16 ENCOUNTER — Encounter: Payer: Self-pay | Admitting: Internal Medicine

## 2014-06-16 LAB — CD4/CD8 (T-HELPER/T-SUPPRESSOR CELL)
CD4%: 43.3
CD4: 1342
CD8 % Suppressor T Cell: 18.4
CD8: 570

## 2014-06-30 ENCOUNTER — Encounter: Payer: Self-pay | Admitting: Internal Medicine

## 2014-07-22 ENCOUNTER — Other Ambulatory Visit: Payer: Self-pay | Admitting: *Deleted

## 2014-07-22 DIAGNOSIS — Z113 Encounter for screening for infections with a predominantly sexual mode of transmission: Secondary | ICD-10-CM

## 2014-07-26 ENCOUNTER — Encounter: Payer: Self-pay | Admitting: *Deleted

## 2014-07-26 NOTE — Progress Notes (Signed)
Patient ID: Geni BersJames Windmiller, male   DOB: 1976/02/09, 39 y.o.   MRN: [id number] Ct [facility] Carlene CoriaSharon Lipscomb @ [facility].

## 2014-08-02 ENCOUNTER — Ambulatory Visit: Payer: Medicare HMO | Admitting: Internal Medicine

## 2014-08-25 ENCOUNTER — Ambulatory Visit (INDEPENDENT_AMBULATORY_CARE_PROVIDER_SITE_OTHER): Payer: Self-pay | Admitting: *Deleted

## 2014-08-25 VITALS — BP 136/88 | HR 54 | Temp 98.3°F | Resp 13 | Wt 312.8 lb

## 2014-08-25 DIAGNOSIS — Z006 Encounter for examination for normal comparison and control in clinical research program: Secondary | ICD-10-CM

## 2014-08-25 LAB — POCT URINALYSIS DIPSTICK
BILIRUBIN UA: NEGATIVE
Blood, UA: NEGATIVE
GLUCOSE UA: NEGATIVE
KETONES UA: NEGATIVE
Leukocytes, UA: NEGATIVE
Nitrite, UA: NEGATIVE
Protein, UA: NEGATIVE
SPEC GRAV UA: 1.025
Urobilinogen, UA: 1
pH, UA: 6

## 2014-08-25 LAB — LIPID PANEL
Cholesterol: 187 mg/dL (ref 0–200)
HDL: 26 mg/dL — AB (ref >=40)
LDL Cholesterol: 137 mg/dL — ABNORMAL HIGH (ref 0–99)
TRIGLYCERIDES: 122 mg/dL (ref <150)
Total CHOL/HDL Ratio: 7.2 Ratio
VLDL: 24 mg/dL (ref 0–40)

## 2014-08-25 LAB — COMPREHENSIVE METABOLIC PANEL
ALBUMIN: 4.1 g/dL (ref 3.5–5.2)
ALT: 37 U/L (ref 0–53)
AST: 21 U/L (ref 0–37)
Alkaline Phosphatase: 123 U/L — ABNORMAL HIGH (ref 39–117)
BUN: 9 mg/dL (ref 6–23)
CO2: 24 mEq/L (ref 19–32)
CREATININE: 0.99 mg/dL (ref 0.50–1.35)
Calcium: 8.7 mg/dL (ref 8.4–10.5)
Chloride: 105 mEq/L (ref 96–112)
GLUCOSE: 99 mg/dL (ref 70–99)
Potassium: 4.4 mEq/L (ref 3.5–5.3)
Sodium: 139 mEq/L (ref 135–145)
Total Bilirubin: 0.3 mg/dL (ref 0.2–1.2)
Total Protein: 6.7 g/dL (ref 6.0–8.3)

## 2014-08-25 LAB — CD4/CD8 (T-HELPER/T-SUPPRESSOR CELL)
CD4%: 43
CD4: 1376
CD8 % Suppressor T Cell: 18.9
CD8: 605

## 2014-08-25 NOTE — Progress Notes (Signed)
Alan Rangel is here for START study, month 48. We reviewed changes to ICF and I answered any questions he had. He signed consent witnessed by me. We also discussed the letter to participants; he initialed the corner. I copied signed letter and signed ICF and gave these to him. He is c/o increased thirst, intermittent cramping to back of legs, intermittent blurry vision, tingling in toes and fingers bilat. Current symptoms include tingling in toes and fingers. He denies blurry vision. He has been eating once per day and this is due to being busy. His last fasting glucose was 120 back in June 2015. Told him the possibility is hyperglycemia/diabetes in which he replied that he has a strong family history of diabetes. We are checking fast glucose and UA today. I have made him an appointment to see Dr. Drue SecondSnider in May. I educated him on why he could be having these symptom and to start drinking more water, limiting amounts of carbohydrate intake (especially candy, chips, sodas), and eat more leafy green veges and eat 5-6 small meals every 3-4 hours, and walk at least 30 min/day. Fasting labs were obtained and vitals are stable. He received $20 gift card for visit and medications were dispensed. Next appt scheduled for April 20, 2015 @ 9am. Tacey HeapElisha Epperson RN

## 2014-08-26 LAB — HIV-1 RNA QUANT-NO REFLEX-BLD
HIV 1 RNA QUANT: 42 {copies}/mL — AB (ref <20)
HIV-1 RNA QUANT, LOG: 1.62 {Log} — AB (ref <1.30)

## 2014-09-02 ENCOUNTER — Encounter: Payer: Self-pay | Admitting: Internal Medicine

## 2014-09-16 ENCOUNTER — Ambulatory Visit: Payer: Medicare HMO | Admitting: Internal Medicine

## 2015-04-20 ENCOUNTER — Ambulatory Visit: Payer: Medicare HMO | Admitting: Internal Medicine

## 2015-04-28 ENCOUNTER — Telehealth: Payer: Self-pay | Admitting: *Deleted

## 2015-04-28 NOTE — Telephone Encounter (Signed)
Requesting letter to be addressed to [facility] stating the he is under the care of Dr. Judyann Munsonynthia Snider for HIV Disease.  RN asked the pt ;whether his HIV diagnosis should be included and he said "Yes."  MD please write the letter.  The patient requests that he be called [phone]) when the letter is ready to be faxed.  He will be there to receive it.  Please address the letter to the [facility].  Fax # [phone].  Unfortunately the patient needs this faxed this week (04/29/15).

## 2015-04-29 NOTE — Telephone Encounter (Signed)
i will write a letter in save it in epic. Let me know if you are unable to print it

## 2015-05-03 ENCOUNTER — Ambulatory Visit: Payer: Medicare HMO | Admitting: Internal Medicine

## 2015-05-05 ENCOUNTER — Encounter: Payer: Self-pay | Admitting: Internal Medicine

## 2015-05-07 DIAGNOSIS — F39 Unspecified mood [affective] disorder: Secondary | ICD-10-CM | POA: Insufficient documentation

## 2015-05-07 DIAGNOSIS — F6089 Other specific personality disorders: Secondary | ICD-10-CM | POA: Insufficient documentation

## 2015-05-07 DIAGNOSIS — F1291 Cannabis use, unspecified, in remission: Secondary | ICD-10-CM

## 2015-05-07 DIAGNOSIS — Z91199 Patient's noncompliance with other medical treatment and regimen due to unspecified reason: Secondary | ICD-10-CM | POA: Insufficient documentation

## 2015-05-07 DIAGNOSIS — Z9119 Patient's noncompliance with other medical treatment and regimen: Secondary | ICD-10-CM | POA: Insufficient documentation

## 2015-05-07 DIAGNOSIS — F609 Personality disorder, unspecified: Secondary | ICD-10-CM | POA: Insufficient documentation

## 2015-05-07 DIAGNOSIS — F1210 Cannabis abuse, uncomplicated: Secondary | ICD-10-CM | POA: Insufficient documentation

## 2015-05-07 HISTORY — DX: Other specific personality disorders: F60.89

## 2015-05-07 HISTORY — DX: Cannabis use, unspecified, in remission: F12.91

## 2015-05-19 NOTE — Telephone Encounter (Signed)
Letter signed and sent.

## 2015-06-13 DIAGNOSIS — R45851 Suicidal ideations: Secondary | ICD-10-CM

## 2015-06-13 HISTORY — DX: Suicidal ideations: R45.851

## 2015-07-28 ENCOUNTER — Ambulatory Visit (INDEPENDENT_AMBULATORY_CARE_PROVIDER_SITE_OTHER): Payer: Medicare HMO | Admitting: Internal Medicine

## 2015-07-28 ENCOUNTER — Encounter: Payer: Medicare HMO | Admitting: *Deleted

## 2015-07-28 ENCOUNTER — Other Ambulatory Visit (HOSPITAL_COMMUNITY)
Admission: RE | Admit: 2015-07-28 | Discharge: 2015-07-28 | Disposition: A | Discharge status: Home / Self Care | Payer: Medicare HMO | Source: Ambulatory Visit | Attending: Internal Medicine | Admitting: Internal Medicine

## 2015-07-28 ENCOUNTER — Encounter: Payer: Self-pay | Admitting: Internal Medicine

## 2015-07-28 ENCOUNTER — Ambulatory Visit: Payer: Medicare HMO | Admitting: *Deleted

## 2015-07-28 VITALS — BP 135/90 | HR 82 | Temp 98.6°F | Wt 307.0 lb

## 2015-07-28 DIAGNOSIS — F4323 Adjustment disorder with mixed anxiety and depressed mood: Secondary | ICD-10-CM

## 2015-07-28 DIAGNOSIS — Z113 Encounter for screening for infections with a predominantly sexual mode of transmission: Secondary | ICD-10-CM | POA: Insufficient documentation

## 2015-07-28 DIAGNOSIS — B20 Human immunodeficiency virus [HIV] disease: Secondary | ICD-10-CM

## 2015-07-28 DIAGNOSIS — Z23 Encounter for immunization: Secondary | ICD-10-CM | POA: Diagnosis not present

## 2015-07-28 DIAGNOSIS — Z006 Encounter for examination for normal comparison and control in clinical research program: Secondary | ICD-10-CM

## 2015-07-28 DIAGNOSIS — Z79899 Other long term (current) drug therapy: Secondary | ICD-10-CM | POA: Diagnosis not present

## 2015-07-28 LAB — CBC WITH DIFFERENTIAL/PLATELET
BASOS ABS: 0 10*3/uL (ref 0.0–0.1)
BASOS PCT: 0 % (ref 0–1)
EOS ABS: 0.1 10*3/uL (ref 0.0–0.7)
EOS PCT: 1 % (ref 0–5)
HEMATOCRIT: 45.2 % (ref 39.0–52.0)
Hemoglobin: 15.5 g/dL (ref 13.0–17.0)
Lymphocytes Relative: 49 % — ABNORMAL HIGH (ref 12–46)
Lymphs Abs: 4 10*3/uL (ref 0.7–4.0)
MCH: 31.4 pg (ref 26.0–34.0)
MCHC: 34.3 g/dL (ref 30.0–36.0)
MCV: 91.7 fL (ref 78.0–100.0)
MONO ABS: 0.7 10*3/uL (ref 0.1–1.0)
MPV: 9.4 fL (ref 8.6–12.4)
Monocytes Relative: 9 % (ref 3–12)
Neutro Abs: 3.3 10*3/uL (ref 1.7–7.7)
Neutrophils Relative %: 41 % — ABNORMAL LOW (ref 43–77)
PLATELETS: 221 10*3/uL (ref 150–400)
RBC: 4.93 MIL/uL (ref 4.22–5.81)
RDW: 13.8 % (ref 11.5–15.5)
WBC: 8.1 10*3/uL (ref 4.0–10.5)

## 2015-07-28 LAB — COMPLETE METABOLIC PANEL WITH GFR
ALT: 48 U/L — AB (ref 9–46)
AST: 22 U/L (ref 10–40)
Albumin: 4.2 g/dL (ref 3.6–5.1)
Alkaline Phosphatase: 125 U/L — ABNORMAL HIGH (ref 40–115)
BUN: 10 mg/dL (ref 7–25)
CALCIUM: 9 mg/dL (ref 8.6–10.3)
CHLORIDE: 104 mmol/L (ref 98–110)
CO2: 27 mmol/L (ref 20–31)
Creat: 1.11 mg/dL (ref 0.60–1.35)
GFR, Est African American: >89 mL/min (ref >=60)
GFR, Est Non African American: 84 mL/min (ref >=60)
Glucose, Bld: 91 mg/dL (ref 65–99)
Potassium: 4 mmol/L (ref 3.5–5.3)
Sodium: 143 mmol/L (ref 135–146)
Total Bilirubin: 0.3 mg/dL (ref 0.2–1.2)
Total Protein: 6.9 g/dL (ref 6.1–8.1)

## 2015-07-28 LAB — LIPID PANEL
CHOL/HDL RATIO: 7.5 ratio — AB (ref <=5.0)
CHOLESTEROL: 196 mg/dL (ref 125–200)
HDL: 26 mg/dL — AB (ref >=40)
LDL Cholesterol: 141 mg/dL — ABNORMAL HIGH (ref <130)
Triglycerides: 146 mg/dL (ref <150)
VLDL: 29 mg/dL (ref <30)

## 2015-07-28 NOTE — Progress Notes (Signed)
Alan Rangel is here for his START, month 60 study visit. He also saw Dr. Drue SecondSnider today. The study will end the end of this year and we will not be able to supply meds after that. He was made aware and knows that he needs to get ADAP started before then. I gave him 1 bottle of meds to make sure he comes back in 3 weeks to see Dr. Drue SecondSnider.
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
## 2015-07-28 NOTE — BH Specialist Note (Signed)
Counselor met with Alan Rangel today per Dr. Baxter Flattery request for anxiety symptoms.  Patient was oriented times four with good affect and dress.  Patient was alert and talkative.  Patient indicated that he was on three different medications for his mental health issues but was frustrated because no one was counseling him along with the medication.  Patient shared that since he was diagnosed with HIV he has had a uncomfortable feeling in the pit of his stomach.  Patient said that he believes its just a little fear and anxiety.Counselor provided support and encouragement.  Counselor recommended that patient meet with counselor to process what is going on in his life especially the things that is causing him a high level of anxiety. Patient agreed and asked how he makes an appointment.  Counselor communicated to patient to make the appointment when he checks out today.  Patient said ok.   Rolena Infante, MA [facility]

## 2015-07-29 LAB — URINE CYTOLOGY ANCILLARY ONLY
Chlamydia: NEGATIVE
Neisseria Gonorrhea: NEGATIVE

## 2015-07-29 LAB — HIV-1 RNA ULTRAQUANT REFLEX TO GENTYP+: HIV-1 RNA Quant, Log: <1.3 Log copies/mL (ref <1.30)

## 2015-07-29 LAB — CYTOLOGY, (ORAL, ANAL, URETHRAL) ANCILLARY ONLY
CHLAMYDIA, DNA PROBE: NEGATIVE
Chlamydia: NEGATIVE
Neisseria Gonorrhea: NEGATIVE
Neisseria Gonorrhea: NEGATIVE

## 2015-07-29 LAB — URINALYSIS, ROUTINE W REFLEX MICROSCOPIC
Bilirubin Urine: NEGATIVE
GLUCOSE, UA: NEGATIVE
HGB URINE DIPSTICK: NEGATIVE
Ketones, ur: NEGATIVE
LEUKOCYTES UA: NEGATIVE
NITRITE: NEGATIVE
Specific Gravity, Urine: 1.037 — ABNORMAL HIGH (ref 1.001–1.035)
pH: 6 (ref 5.0–8.0)

## 2015-07-29 LAB — T-HELPER CELL (CD4) - (RCID CLINIC ONLY)
CD4 % Helper T Cell: 45 % (ref 33–55)
CD4 T Cell Abs: 1880 /uL (ref 400–2700)

## 2015-07-29 LAB — RPR

## 2015-08-18 ENCOUNTER — Ambulatory Visit: Payer: Medicare HMO | Admitting: Internal Medicine

## 2015-08-27 DIAGNOSIS — F1910 Other psychoactive substance abuse, uncomplicated: Secondary | ICD-10-CM | POA: Insufficient documentation

## 2015-08-27 HISTORY — DX: Other psychoactive substance abuse, uncomplicated: F19.10

## 2015-11-28 ENCOUNTER — Telehealth: Payer: Self-pay | Admitting: *Deleted

## 2015-11-28 NOTE — Telephone Encounter (Signed)
Alan Rangel called and said he was having ankle swelling and had tried taking HCTZ that he had left over from before and now his legs were cramping and he still had swelling in his ankles. He said he took 12.5 of HCTZ this Saturday and again on Sunday. He did tell me that he was in [facility] for 3 days over a month ago because his heart rate had dropped. He said they had referred him for followup  to a cardiologist but he didn't go because he didn't have money for the copay. (45$) I told him it sounded like a heart related problem and he needed to get in and see the cardiologist, not us. I told him to go ahead and call them up to see if they could see him quickly or go back to his Internal medicine physician which he had not seen for awhile.  He has an appointment with us in September which will be his last study appointment.

## 2015-11-29 NOTE — Telephone Encounter (Signed)
See if we can get him into [facility] cell [facility] for pcp access

## 2016-01-30 ENCOUNTER — Other Ambulatory Visit: Payer: Self-pay | Admitting: Internal Medicine

## 2016-01-30 ENCOUNTER — Encounter (INDEPENDENT_AMBULATORY_CARE_PROVIDER_SITE_OTHER): Payer: Medicare HMO | Admitting: *Deleted

## 2016-01-30 ENCOUNTER — Encounter: Payer: Self-pay | Admitting: *Deleted

## 2016-01-30 VITALS — BP 141/88 | HR 68 | Temp 97.6°F | Wt 308.0 lb

## 2016-01-30 DIAGNOSIS — Z006 Encounter for examination for normal comparison and control in clinical research program: Secondary | ICD-10-CM

## 2016-01-30 NOTE — Progress Notes (Signed)
Alan Rangel is here for START study, final visit. He states he has not taken ANY of his medications in the past 2 months and HIV meds in the past month. He ran out of his study medication (STRIBILD) and his psych meds he decided to discontinue because he was becoming overwhelmed and felt he needed a break.  Lately he has been having a feeling of doom. He feels something is going to happen if he cannot get someone the help him with his psych regimen. He felt his anxiety could get to a point to where he could have a stroke/MI. At times his anxiety is on the verge of exploding. He gave examples like when he tries to go for a walk and someone starts talking to him, he feels the need to go back inside. He keeps to himself in the house because of altercations/contacts like these. Because he feels the need to stay inside he also feels he is becoming more depressed/lack of motivation. He stated it took a lot for him to come in today. He is very aware of his feelings/thoughts, what causes it and he also know what has helped him in the past. He feels as though he is caught between a rock and a hard place. His goals are to reach out to [facility] in [place] for pyschiatric care; [facility], and he is hoping to find housing in another area to get away from familiar faces so he can have a calmer environment. I will reach out to Mitch ([facility] counselor) to see what options he has; Olegario MessierKathy will see him today to set him up with SPAP since he is on Medicare/[employer]. Have him an appointment with Dr. Drue SecondSnider in November and will discuss ARV regimen to see if continuing would be appropriate. Did draw a VL today and possibly run a genotype if Dr. Drue SecondSnider sees fit. He received gift card for visit today. Tacey HeapElisha Joie Hipps RN

## 2016-01-31 LAB — CD4/CD8 (T-HELPER/T-SUPPRESSOR CELL)
CD4%: 46.6
CD4: 2050
CD8 T CELL SUPPRESSOR: 16.6
CD8: 730

## 2016-01-31 LAB — HIV-1 RNA QUANT-NO REFLEX-BLD
HIV 1 RNA QUANT: 3816 {copies}/mL — AB (ref <20)
HIV-1 RNA Quant, Log: 3.58 Log copies/mL — ABNORMAL HIGH (ref <1.30)

## 2016-02-01 ENCOUNTER — Other Ambulatory Visit: Payer: Self-pay | Admitting: Infectious Disease

## 2016-02-01 DIAGNOSIS — B20 Human immunodeficiency virus [HIV] disease: Secondary | ICD-10-CM

## 2016-02-01 NOTE — Progress Notes (Signed)
Alan Rangel can you ask lab to add on the genotype I ordered which includes PI, NRTI and INSTI (since he is on a INSTI based regimen?

## 2016-02-01 NOTE — Progress Notes (Signed)
Since STRIBILD has a low barrier to resistance it would be wise to add a genotype with integrase resistance to blood work that has been done. Can you tell me more about the pattern here. Did he just stop did he try to stretch his meds?

## 2016-02-08 LAB — HIV-1 GENOTYPE: HIV-1 GENOTYPE: DETECTED — AB

## 2016-02-10 ENCOUNTER — Encounter: Payer: Self-pay | Admitting: Internal Medicine

## 2016-03-12 ENCOUNTER — Encounter: Payer: Self-pay | Admitting: Internal Medicine

## 2016-03-28 ENCOUNTER — Ambulatory Visit: Payer: Medicare HMO | Admitting: Internal Medicine

## 2016-03-29 ENCOUNTER — Ambulatory Visit: Payer: Medicare HMO | Admitting: Internal Medicine

## 2016-06-05 ENCOUNTER — Ambulatory Visit: Payer: Medicare HMO | Admitting: Internal Medicine

## 2016-06-28 ENCOUNTER — Ambulatory Visit (INDEPENDENT_AMBULATORY_CARE_PROVIDER_SITE_OTHER): Payer: Medicare HMO | Admitting: Pharmacist Clinician (PhC)/ Clinical Pharmacy Specialist

## 2016-06-28 DIAGNOSIS — B20 Human immunodeficiency virus [HIV] disease: Secondary | ICD-10-CM | POA: Diagnosis not present

## 2016-06-28 LAB — CBC
HCT: 45.2 % (ref 38.5–50.0)
Hemoglobin: 15.4 g/dL (ref 13.2–17.1)
MCH: 31.3 pg (ref 27.0–33.0)
MCHC: 34.1 g/dL (ref 32.0–36.0)
MCV: 91.9 fL (ref 80.0–100.0)
MPV: 9.5 fL (ref 7.5–12.5)
PLATELETS: 203 10*3/uL (ref 140–400)
RBC: 4.92 MIL/uL (ref 4.20–5.80)
RDW: 14 % (ref 11.0–15.0)
WBC: 6.7 10*3/uL (ref 3.8–10.8)

## 2016-06-28 LAB — BASIC METABOLIC PANEL
BUN: 10 mg/dL (ref 7–25)
CALCIUM: 9 mg/dL (ref 8.6–10.3)
CO2: 23 mmol/L (ref 20–31)
CREATININE: 0.99 mg/dL (ref 0.60–1.35)
Chloride: 104 mmol/L (ref 98–110)
Glucose, Bld: 106 mg/dL — ABNORMAL HIGH (ref 65–99)
Potassium: 4.5 mmol/L (ref 3.5–5.3)
SODIUM: 137 mmol/L (ref 135–146)

## 2016-06-28 MED ORDER — DOLUTEGRAVIR SODIUM 50 MG PO TABS: 50.0000 mg | ORAL_TABLET | Freq: Every day | ORAL | 5 refills | Status: DC

## 2016-06-28 MED ORDER — EMTRICITABINE-TENOFOVIR AF 200-25 MG PO TABS: 1.0000 | ORAL_TABLET | Freq: Every day | ORAL | 5 refills | Status: DC

## 2016-06-28 NOTE — Patient Instructions (Addendum)
Start your Tivicay and Descovy once you get it Come back for a visit with Dr. Drue SecondSnider on 3/13 Will do labs today

## 2016-06-28 NOTE — Progress Notes (Signed)
HPI: Alan Rangel is a 41 y.o. male who was in the START study until he stopped it sometimes last July. He is here to re-establish care.   Allergies: No Known Allergies  Vitals:    Past Medical History: Past Medical History:  Diagnosis Date  . HIV (human immunodeficiency virus infection) (HCC)   . HTN (hypertension)   . Panic attack     Social History: Social History   Social History  . Marital status: Single    Spouse name: N/A  . Number of children: 0  . Years of education: college   Occupational History  . UNEMPLOYED Unemployed  . student    Social History Main Topics  . Smoking status: Current Every Day Smoker    Packs/day: 0.50    Years: 10.00    Types: Cigarettes  . Smokeless tobacco: Never Used     Comment: would like help  . Alcohol use No     Comment: History of, but not currently  . Drug use: No  . Sexual activity: No     Comment: declined condoms   Other Topics Concern  . Not on file   Social History Narrative  . No narrative on file    Previous Regimen: DRV/r/TRV--->Stribild  Current Regimen: Off  Labs: HIV 1 RNA Quant (copies/mL)  Date Value  01/30/2016 3,816 (H)  07/28/2015 <20  08/25/2014 42 (H)   CD4 (no units)  Date Value  01/30/2016 2,050  08/25/2014 1,376  06/09/2014 1,342   CD4 T Cell Abs (/uL)  Date Value  07/28/2015 1,880   Hep B S Ab (no units)  Date Value  06/17/2013 POS (A)   Hepatitis B Surface Ag (no units)  Date Value  06/17/2013 NEGATIVE   HCV Ab (no units)  Date Value  06/17/2013 NEGATIVE    CrCl: CrCl cannot be calculated (Patient's most recent lab result is older than the maximum 21 days allowed.).  Lipids:    Component Value Date/Time   CHOL 196 07/28/2015 1122   TRIG 146 07/28/2015 1122   HDL 26 (L) 07/28/2015 1122   CHOLHDL 7.5 (H) 07/28/2015 1122   VLDL 29 07/28/2015 1122   LDLCALC 141 (H) 07/28/2015 1122    Assessment: Alan Rangel was in the START study and was on Stribild at that time.  When he visit with the study coordinator in Sept of last yr, he had stopped the meds since July along with his other psych meds. He stated that he was trying to get an appt to re-establish care but it wasn't successful. Therefore, he has been off of meds since July. I asked him about whether he ever skipped doses or just stopped completely. He stated that he stopped it completely. I'm concern that he has been off of meds for this long. We are going to do all labs today in him.   In the meantime, I'm going to restart him on ART today. Was planning on using Biktarvy but he can't use [facility] pharmacy because of SPAP. I didn't want this to be a delay. I put him on Tivicay and Descovy instead. They were sent to the [facility] in [place] so they can mail it to him because he doesn't have a car. He said that he has an upcoming appt with psych for his meds. He had an appt for lab on 2/27 but we'll do labs today since he is here.   Recommendations:  Start Tivicay 50mg  PO qday Start Descovy 1 PO qday HIV VL with  geno+, CD4, Bmet, CBC, RPR today F/u with Dr. Drue Second on 3/13  Ulyses Southward, PharmD, BCPS, AAHIVP, CPP Clinical Infectious Disease Pharmacist [facility] 06/28/2016, 10:33 AM

## 2016-06-28 NOTE — Addendum Note (Signed)
Addended by: Mariea ClontsGREEN, Kenedie Dirocco D on: 06/28/2016 03:07 PM   Modules accepted: Orders

## 2016-06-29 LAB — RPR

## 2016-06-29 LAB — T-HELPER CELL (CD4) - (RCID CLINIC ONLY)
CD4 % Helper T Cell: 42 % (ref 33–55)
CD4 T Cell Abs: 1620 /uL (ref 400–2700)

## 2016-07-02 ENCOUNTER — Encounter: Payer: Self-pay | Admitting: Internal Medicine

## 2016-07-03 LAB — HIV-1 RNA,QN PCR W/REFLEX GENOTYPE
HIV-1 RNA, QN PCR: 4350 {copies}/mL — AB
HIV-1 RNA, QN PCR: 3.64 Log cps/mL — ABNORMAL HIGH

## 2016-07-06 LAB — HLA B*5701: HLA-B*5701 w/rflx HLA-B High: NEGATIVE

## 2016-07-10 ENCOUNTER — Other Ambulatory Visit: Payer: Medicare HMO

## 2016-07-20 LAB — HIV-1 GENOTYPR PLUS

## 2016-07-24 ENCOUNTER — Ambulatory Visit (INDEPENDENT_AMBULATORY_CARE_PROVIDER_SITE_OTHER): Payer: Medicare HMO | Admitting: Internal Medicine

## 2016-07-24 ENCOUNTER — Encounter: Payer: Self-pay | Admitting: Internal Medicine

## 2016-07-24 ENCOUNTER — Other Ambulatory Visit: Payer: Self-pay | Admitting: Internal Medicine

## 2016-07-24 VITALS — Ht 72.0 in | Wt 313.0 lb

## 2016-07-24 DIAGNOSIS — B20 Human immunodeficiency virus [HIV] disease: Secondary | ICD-10-CM

## 2016-07-24 DIAGNOSIS — F4323 Adjustment disorder with mixed anxiety and depressed mood: Secondary | ICD-10-CM

## 2016-07-24 DIAGNOSIS — Z23 Encounter for immunization: Secondary | ICD-10-CM

## 2016-07-24 DIAGNOSIS — R252 Cramp and spasm: Secondary | ICD-10-CM | POA: Diagnosis not present
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
## 2016-07-25 LAB — CK: Total CK: 512 U/L — ABNORMAL HIGH (ref 7–232)

## 2016-07-26 LAB — HIV-1 RNA,QN PCR W/REFLEX GENOTYPE
HIV-1 RNA, QN PCR: <1.3 Log cps/mL — ABNORMAL HIGH
HIV-1 RNA, QN PCR: <20 Copies/mL — ABNORMAL HIGH

## 2016-09-25 ENCOUNTER — Ambulatory Visit: Payer: Medicare HMO | Admitting: Internal Medicine

## 2016-10-25 ENCOUNTER — Ambulatory Visit: Payer: Medicare HMO | Admitting: Internal Medicine

## 2016-11-12 LAB — HIV-1 GENOTYPE: HIV-1 Genotype: DETECTED — AB

## 2017-01-26 ENCOUNTER — Emergency Department (HOSPITAL_COMMUNITY)
Admission: EM | Admit: 2017-01-26 | Discharge: 2017-01-27 | Disposition: A | Discharge status: Home / Self Care | Payer: Medicare HMO | Attending: Emergency Medicine | Admitting: Emergency Medicine

## 2017-01-26 ENCOUNTER — Encounter (HOSPITAL_COMMUNITY): Payer: Self-pay | Admitting: Emergency Medicine

## 2017-01-26 DIAGNOSIS — F250 Schizoaffective disorder, bipolar type: Secondary | ICD-10-CM | POA: Diagnosis not present

## 2017-01-26 DIAGNOSIS — R4583 Excessive crying of child, adolescent or adult: Secondary | ICD-10-CM | POA: Diagnosis not present

## 2017-01-26 DIAGNOSIS — R45851 Suicidal ideations: Secondary | ICD-10-CM | POA: Insufficient documentation

## 2017-01-26 DIAGNOSIS — F200 Paranoid schizophrenia: Secondary | ICD-10-CM | POA: Insufficient documentation

## 2017-01-26 DIAGNOSIS — F17210 Nicotine dependence, cigarettes, uncomplicated: Secondary | ICD-10-CM | POA: Insufficient documentation

## 2017-01-26 DIAGNOSIS — F329 Major depressive disorder, single episode, unspecified: Secondary | ICD-10-CM | POA: Diagnosis present

## 2017-01-26 DIAGNOSIS — Z21 Asymptomatic human immunodeficiency virus [HIV] infection status: Secondary | ICD-10-CM | POA: Insufficient documentation

## 2017-01-26 DIAGNOSIS — R42 Dizziness and giddiness: Secondary | ICD-10-CM | POA: Diagnosis not present

## 2017-01-26 DIAGNOSIS — Z79899 Other long term (current) drug therapy: Secondary | ICD-10-CM | POA: Insufficient documentation

## 2017-01-26 DIAGNOSIS — F419 Anxiety disorder, unspecified: Secondary | ICD-10-CM | POA: Insufficient documentation

## 2017-01-26 DIAGNOSIS — Z008 Encounter for other general examination: Secondary | ICD-10-CM | POA: Insufficient documentation

## 2017-01-26 DIAGNOSIS — Z599 Problem related to housing and economic circumstances, unspecified: Secondary | ICD-10-CM | POA: Diagnosis not present

## 2017-01-26 HISTORY — DX: Major depressive disorder, single episode, unspecified: F32.9

## 2017-01-26 HISTORY — DX: Schizoaffective disorder, bipolar type: F25.0

## 2017-01-26 HISTORY — DX: Paranoid schizophrenia: F20.0

## 2017-01-26 HISTORY — DX: Depression, unspecified: F32.A

## 2017-01-26 HISTORY — DX: Unspecified mood (affective) disorder: F39

## 2017-01-26 HISTORY — DX: Generalized anxiety disorder: F41.1

## 2017-01-26 HISTORY — DX: Personality disorder, unspecified: F60.9

## 2017-01-26 LAB — CBC
HEMATOCRIT: 45.1 % (ref 39.0–52.0)
HEMOGLOBIN: 15.4 g/dL (ref 13.0–17.0)
MCH: 31.6 pg (ref 26.0–34.0)
MCHC: 34.1 g/dL (ref 30.0–36.0)
MCV: 92.6 fL (ref 78.0–100.0)
Platelets: 198 10*3/uL (ref 150–400)
RBC: 4.87 MIL/uL (ref 4.22–5.81)
RDW: 13.2 % (ref 11.5–15.5)
WBC: 6.5 10*3/uL (ref 4.0–10.5)

## 2017-01-26 LAB — COMPREHENSIVE METABOLIC PANEL
ALBUMIN: 4.1 g/dL (ref 3.5–5.0)
ALK PHOS: 93 U/L (ref 38–126)
ALT: 23 U/L (ref 17–63)
AST: 18 U/L (ref 15–41)
Anion gap: 8 (ref 5–15)
BUN: 5 mg/dL — ABNORMAL LOW (ref 6–20)
CALCIUM: 8.8 mg/dL — AB (ref 8.9–10.3)
CO2: 25 mmol/L (ref 22–32)
Chloride: 105 mmol/L (ref 101–111)
Creatinine, Ser: 1.15 mg/dL (ref 0.61–1.24)
GFR calc Af Amer: >60 mL/min (ref >60)
GFR calc non Af Amer: >60 mL/min (ref >60)
GLUCOSE: 99 mg/dL (ref 65–99)
Potassium: 3.9 mmol/L (ref 3.5–5.1)
SODIUM: 138 mmol/L (ref 135–145)
Total Bilirubin: 0.5 mg/dL (ref 0.3–1.2)
Total Protein: 6.9 g/dL (ref 6.5–8.1)

## 2017-01-26 LAB — RAPID URINE DRUG SCREEN, HOSP PERFORMED
Amphetamines: NOT DETECTED
BARBITURATES: NOT DETECTED
BENZODIAZEPINES: NOT DETECTED
COCAINE: NOT DETECTED
Opiates: NOT DETECTED
TETRAHYDROCANNABINOL: NOT DETECTED

## 2017-01-26 LAB — ACETAMINOPHEN LEVEL: Acetaminophen (Tylenol), Serum: <10 ug/mL — ABNORMAL LOW (ref 10–30)

## 2017-01-26 LAB — SALICYLATE LEVEL: Salicylate Lvl: <7 mg/dL (ref 2.8–30.0)

## 2017-01-26 LAB — ETHANOL: Alcohol, Ethyl (B): <5 mg/dL (ref <5)

## 2017-01-26 MED ORDER — ALUM & MAG HYDROXIDE-SIMETH 200-200-20 MG/5ML PO SUSP: 30.0000 mL | Freq: Four times a day (QID) | ORAL | Status: DC | PRN

## 2017-01-26 MED ORDER — ACETAMINOPHEN 325 MG PO TABS
650.0000 mg | ORAL_TABLET | ORAL | Status: DC | PRN
  Administered 2017-01-26: 650 mg via ORAL
  Filled 2017-01-26: qty 2

## 2017-01-26 MED ORDER — NICOTINE 21 MG/24HR TD PT24
21.0000 mg | MEDICATED_PATCH | Freq: Once | TRANSDERMAL | Status: DC
  Administered 2017-01-26: 21 mg via TRANSDERMAL
  Filled 2017-01-26: qty 1

## 2017-01-26 MED ORDER — DOLUTEGRAVIR SODIUM 50 MG PO TABS
50.0000 mg | ORAL_TABLET | Freq: Every day | ORAL | Status: DC
  Administered 2017-01-26: 50 mg via ORAL
  Filled 2017-01-26 (×2): qty 1

## 2017-01-26 MED ORDER — ONDANSETRON HCL 4 MG PO TABS: 4.0000 mg | ORAL_TABLET | Freq: Three times a day (TID) | ORAL | Status: DC | PRN

## 2017-01-26 MED ORDER — EMTRICITABINE-TENOFOVIR AF 200-25 MG PO TABS
1.0000 | ORAL_TABLET | Freq: Every day | ORAL | Status: DC
  Administered 2017-01-26: 1 via ORAL
  Filled 2017-01-26 (×2): qty 1
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
## 2017-01-26 NOTE — ED Notes (Signed)
Per Pharmacy Tech, pt states not taking his meds and denies getting them filled. Pharm Tech verified w/pharmacy pt picked up meds.
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
## 2017-01-26 NOTE — ED Notes (Signed)
Maczis, PA, in w/pt.

## 2017-01-26 NOTE — ED Notes (Signed)
PT ambulated to the bathroom with a steady gait. Pt wanted a coke. Pt was given non caffeine coke.

## 2017-01-26 NOTE — ED Notes (Signed)
TTS being performed.  

## 2017-01-26 NOTE — ED Triage Notes (Addendum)
Pt states 1 week of suicidal ideation, with plan to use box cutters. No family support in the area. Hx of depression. States just moved from [place] 2 weeks ago. PT worked at [employer], 2 days ago he was lifting a pallet and hurt the middle of his back.  Has not shown up for work since then.

## 2017-01-26 NOTE — ED Notes (Signed)
PT ask RN about beeing able to go home or discharged today. Pt was explained that he meets in patient criteria for placement. Pt was also told that he will be able to talk to a councilor in the morning. Pt understood and is willing to wait to the morning to see if he can go to work.

## 2017-01-26 NOTE — ED Notes (Signed)
Pt given purple scrubs, belongings placed in bags for primary RN to inventory. Staffing called for a sitter. Pt wanded by security.

## 2017-01-27 DIAGNOSIS — F17210 Nicotine dependence, cigarettes, uncomplicated: Secondary | ICD-10-CM

## 2017-01-27 DIAGNOSIS — F250 Schizoaffective disorder, bipolar type: Secondary | ICD-10-CM

## 2017-01-27 DIAGNOSIS — Z818 Family history of other mental and behavioral disorders: Secondary | ICD-10-CM

## 2017-01-27 DIAGNOSIS — Z599 Problem related to housing and economic circumstances, unspecified: Secondary | ICD-10-CM | POA: Diagnosis not present

## 2017-01-27 DIAGNOSIS — Z6379 Other stressful life events affecting family and household: Secondary | ICD-10-CM

## 2017-01-27 DIAGNOSIS — R4583 Excessive crying of child, adolescent or adult: Secondary | ICD-10-CM

## 2017-01-27 DIAGNOSIS — R42 Dizziness and giddiness: Secondary | ICD-10-CM

## 2017-01-27 NOTE — [facility] Counselor (Signed)
Re-assessment- Pt denies SI/HI and AVH. Pt states he would like to be D/C.   Inetta Fermo, NP will see the PT for an am psych evaluation.

## 2017-01-27 NOTE — ED Notes (Addendum)
Pt aware of tx plan - d/c to home. Voiced understanding and agreement.

## 2017-01-27 NOTE — ED Notes (Signed)
Telepsych completed. Declined snack - gave lunch order request.

## 2017-01-27 NOTE — ED Notes (Signed)
States he wants to be d/c'd today d/t feels better after getting some sleep. States didn't sleep all night but "slept pretty good".

## 2017-01-27 NOTE — Progress Notes (Signed)
Patient was recommended discharge by Ferne Reus NP with [facility] on 8/16.  [facility] RN Ledon Snare updated.  Melbourne Abts, MSW, [employer] Clinical social worker in disposition [facility], TTS Office [phone] and [phone] 01/27/2017 11:01 AM

## 2017-01-27 NOTE — ED Notes (Signed)
Pt aware of delay. Advised pt EDP advised she is coming to see pt shortly.
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
## 2017-03-05 ENCOUNTER — Encounter: Payer: Self-pay | Admitting: Internal Medicine

## 2017-03-11 ENCOUNTER — Other Ambulatory Visit: Payer: Self-pay | Admitting: *Deleted

## 2017-03-11 MED ORDER — DOLUTEGRAVIR SODIUM 50 MG PO TABS: 50.0000 mg | ORAL_TABLET | Freq: Every day | ORAL | 2 refills | Status: DC

## 2017-03-11 MED ORDER — EMTRICITABINE-TENOFOVIR AF 200-25 MG PO TABS: 1.0000 | ORAL_TABLET | Freq: Every day | ORAL | 2 refills | Status: DC

## 2017-03-28 ENCOUNTER — Ambulatory Visit: Payer: Self-pay | Admitting: Internal Medicine

## 2017-04-03 ENCOUNTER — Telehealth: Payer: Self-pay | Admitting: Licensed Clinical Social Worker

## 2017-04-03 ENCOUNTER — Telehealth: Payer: Self-pay

## 2017-04-03 NOTE — Telephone Encounter (Signed)
Patient called concerned that "a referral" was made without his consent.  [facility] part of the treatment team and was following up with him based on team meeting.  Patient stated [facility] and their office needed to get in touch with Dr. Drue SecondSnider.  [facility] explained that his medical issues were out of the scope of practice and that she would transfer him to the Triage Nurse.  [facility] placed the patient on hold and located the Facilities managerurse Manager and explained the situation.  [facility] then went back to patient and explained that he was being transferred to Nurse Manager Tomasita Morrowammy King and mental health concerns could be addressed later after the important medical issues were addressed.  Vergia AlbertsSherry Skya Mccullum, [facility]

## 2017-04-03 NOTE — Telephone Encounter (Signed)
Call transferred from Vergia AlbertsSherry Royster, Counselor with [facility]. [facility] [place] and was initially diagnosed with pneumonia. He was given antibiotics and advised to follow up with Dr Drue SecondSnider. Patient called the office to schedule appointment and was told Dr Feliz BeamSnider's next available would be in January 2019.  His symptoms worsened and [facility] and [facility] for Influ[facility] B.  Patient states [facility] information to our office but there was no return call.   He is very upset there was no return call to their office or to him.   He became extremely upset when Cordelia PenSherry called to say a referral was placed related to his mental health issue and was offered a next day appointment.  To him it seems we are more concerned about his mental health than his physical health.  Once he expressed this concern Cordelia PenSherry asked that I speak with the patient.   Patient was given a follow up appointment for Monday, April 08, 2017 with Dr Orvan Falconerampbell.  He was advised to drink plenty of fluids, get lots of rest and to ambulate as often as possible while at home. Take prescribed medications as advised from Dr. Claybon JabsEmily Brown and if his symptoms seem to worsen go to the closest emergency room for an evaluation.   I will contact [facility] to obtain medical records, labs and xray reports for f/u visit.     Laurell Josephsammy K King, RN

## 2017-04-03 NOTE — Telephone Encounter (Signed)
[facility] and [facility] self and requested a return phone call.  Vergia AlbertsSherry Florencio Hollibaugh, [facility]

## 2017-04-08 ENCOUNTER — Ambulatory Visit: Payer: Self-pay | Admitting: Internal Medicine

## 2017-04-08 NOTE — Telephone Encounter (Signed)
04-08-17 Message on voicemail from patient.  He was not sure if his appointment was today or tomorrow. ' Return ed call left message appointment is today at 3:30 pm.

## 2017-05-08 ENCOUNTER — Emergency Department (HOSPITAL_COMMUNITY): Payer: Medicare HMO

## 2017-05-08 ENCOUNTER — Emergency Department (HOSPITAL_COMMUNITY)
Admission: EM | Admit: 2017-05-08 | Discharge: 2017-05-08 | Disposition: A | Discharge status: Other Acute Inpatient Hospital | Payer: Medicare HMO | Attending: Emergency Medicine | Admitting: Emergency Medicine

## 2017-05-08 ENCOUNTER — Inpatient Hospital Stay (HOSPITAL_COMMUNITY)
Admission: AD | Admit: 2017-05-08 | Discharge: 2017-05-10 | DRG: 885 | Disposition: A | Discharge status: Home / Self Care | Payer: Medicare HMO | Source: Intra-hospital | Attending: Psychiatry | Admitting: Psychiatry

## 2017-05-08 ENCOUNTER — Encounter (HOSPITAL_COMMUNITY): Payer: Self-pay | Admitting: Emergency Medicine

## 2017-05-08 ENCOUNTER — Other Ambulatory Visit: Payer: Self-pay

## 2017-05-08 ENCOUNTER — Encounter (HOSPITAL_COMMUNITY): Payer: Self-pay | Admitting: *Deleted

## 2017-05-08 DIAGNOSIS — R0789 Other chest pain: Secondary | ICD-10-CM | POA: Diagnosis not present

## 2017-05-08 DIAGNOSIS — Z915 Personal history of self-harm: Secondary | ICD-10-CM | POA: Diagnosis not present

## 2017-05-08 DIAGNOSIS — F411 Generalized anxiety disorder: Secondary | ICD-10-CM | POA: Diagnosis present

## 2017-05-08 DIAGNOSIS — F4001 Agoraphobia with panic disorder: Secondary | ICD-10-CM | POA: Diagnosis present

## 2017-05-08 DIAGNOSIS — F410 Panic disorder [episodic paroxysmal anxiety] without agoraphobia: Secondary | ICD-10-CM

## 2017-05-08 DIAGNOSIS — F17210 Nicotine dependence, cigarettes, uncomplicated: Secondary | ICD-10-CM | POA: Insufficient documentation

## 2017-05-08 DIAGNOSIS — F419 Anxiety disorder, unspecified: Secondary | ICD-10-CM | POA: Diagnosis not present

## 2017-05-08 DIAGNOSIS — R441 Visual hallucinations: Secondary | ICD-10-CM

## 2017-05-08 DIAGNOSIS — Z91410 Personal history of adult physical and sexual abuse: Secondary | ICD-10-CM

## 2017-05-08 DIAGNOSIS — Z79899 Other long term (current) drug therapy: Secondary | ICD-10-CM | POA: Diagnosis not present

## 2017-05-08 DIAGNOSIS — F4310 Post-traumatic stress disorder, unspecified: Secondary | ICD-10-CM | POA: Diagnosis present

## 2017-05-08 DIAGNOSIS — F259 Schizoaffective disorder, unspecified: Secondary | ICD-10-CM | POA: Diagnosis present

## 2017-05-08 DIAGNOSIS — F258 Other schizoaffective disorders: Secondary | ICD-10-CM | POA: Diagnosis present

## 2017-05-08 DIAGNOSIS — F250 Schizoaffective disorder, bipolar type: Secondary | ICD-10-CM | POA: Diagnosis present

## 2017-05-08 DIAGNOSIS — Z811 Family history of alcohol abuse and dependence: Secondary | ICD-10-CM | POA: Diagnosis not present

## 2017-05-08 DIAGNOSIS — Z21 Asymptomatic human immunodeficiency virus [HIV] infection status: Secondary | ICD-10-CM | POA: Diagnosis present

## 2017-05-08 DIAGNOSIS — Z818 Family history of other mental and behavioral disorders: Secondary | ICD-10-CM

## 2017-05-08 DIAGNOSIS — I10 Essential (primary) hypertension: Secondary | ICD-10-CM | POA: Diagnosis present

## 2017-05-08 DIAGNOSIS — R45851 Suicidal ideations: Secondary | ICD-10-CM | POA: Diagnosis present

## 2017-05-08 DIAGNOSIS — R443 Hallucinations, unspecified: Secondary | ICD-10-CM | POA: Diagnosis present

## 2017-05-08 DIAGNOSIS — B20 Human immunodeficiency virus [HIV] disease: Secondary | ICD-10-CM | POA: Insufficient documentation

## 2017-05-08 DIAGNOSIS — Z62810 Personal history of physical and sexual abuse in childhood: Secondary | ICD-10-CM | POA: Diagnosis present

## 2017-05-08 DIAGNOSIS — R450 Nervousness: Secondary | ICD-10-CM | POA: Diagnosis not present

## 2017-05-08 HISTORY — DX: Other schizoaffective disorders: F25.8

## 2017-05-08 LAB — CBC
HCT: 44.7 % (ref 39.0–52.0)
Hemoglobin: 15.3 g/dL (ref 13.0–17.0)
MCH: 32.8 pg (ref 26.0–34.0)
MCHC: 34.2 g/dL (ref 30.0–36.0)
MCV: 95.7 fL (ref 78.0–100.0)
PLATELETS: 213 10*3/uL (ref 150–400)
RBC: 4.67 MIL/uL (ref 4.22–5.81)
RDW: 13.1 % (ref 11.5–15.5)
WBC: 9.1 10*3/uL (ref 4.0–10.5)

## 2017-05-08 LAB — BASIC METABOLIC PANEL
Anion gap: 8 (ref 5–15)
BUN: 9 mg/dL (ref 6–20)
CALCIUM: 8.8 mg/dL — AB (ref 8.9–10.3)
CO2: 25 mmol/L (ref 22–32)
CREATININE: 1.12 mg/dL (ref 0.61–1.24)
Chloride: 104 mmol/L (ref 101–111)
GFR calc non Af Amer: >60 mL/min (ref >60)
GLUCOSE: 138 mg/dL — AB (ref 65–99)
Potassium: 4.1 mmol/L (ref 3.5–5.1)
Sodium: 137 mmol/L (ref 135–145)

## 2017-05-08 LAB — RAPID URINE DRUG SCREEN, HOSP PERFORMED
AMPHETAMINES: NOT DETECTED
BARBITURATES: NOT DETECTED
BENZODIAZEPINES: POSITIVE — AB
Cocaine: NOT DETECTED
Opiates: NOT DETECTED
Tetrahydrocannabinol: NOT DETECTED

## 2017-05-08 LAB — ACETAMINOPHEN LEVEL: Acetaminophen (Tylenol), Serum: <10 ug/mL — ABNORMAL LOW (ref 10–30)

## 2017-05-08 LAB — SALICYLATE LEVEL: Salicylate Lvl: <7 mg/dL (ref 2.8–30.0)

## 2017-05-08 LAB — I-STAT TROPONIN, ED: TROPONIN I, POC: 0 ng/mL (ref 0.00–0.08)

## 2017-05-08 LAB — ETHANOL: Alcohol, Ethyl (B): <10 mg/dL (ref <10)

## 2017-05-08 IMAGING — CR DG CHEST 2V
2 series · 2 of 2 positions shown · non-contrast
Comparison: Chest radiograph performed [DATE]

CLINICAL DATA: Acute onset of generalized chest tightness.

EXAM:
CHEST  2 VIEW

[chest pa]
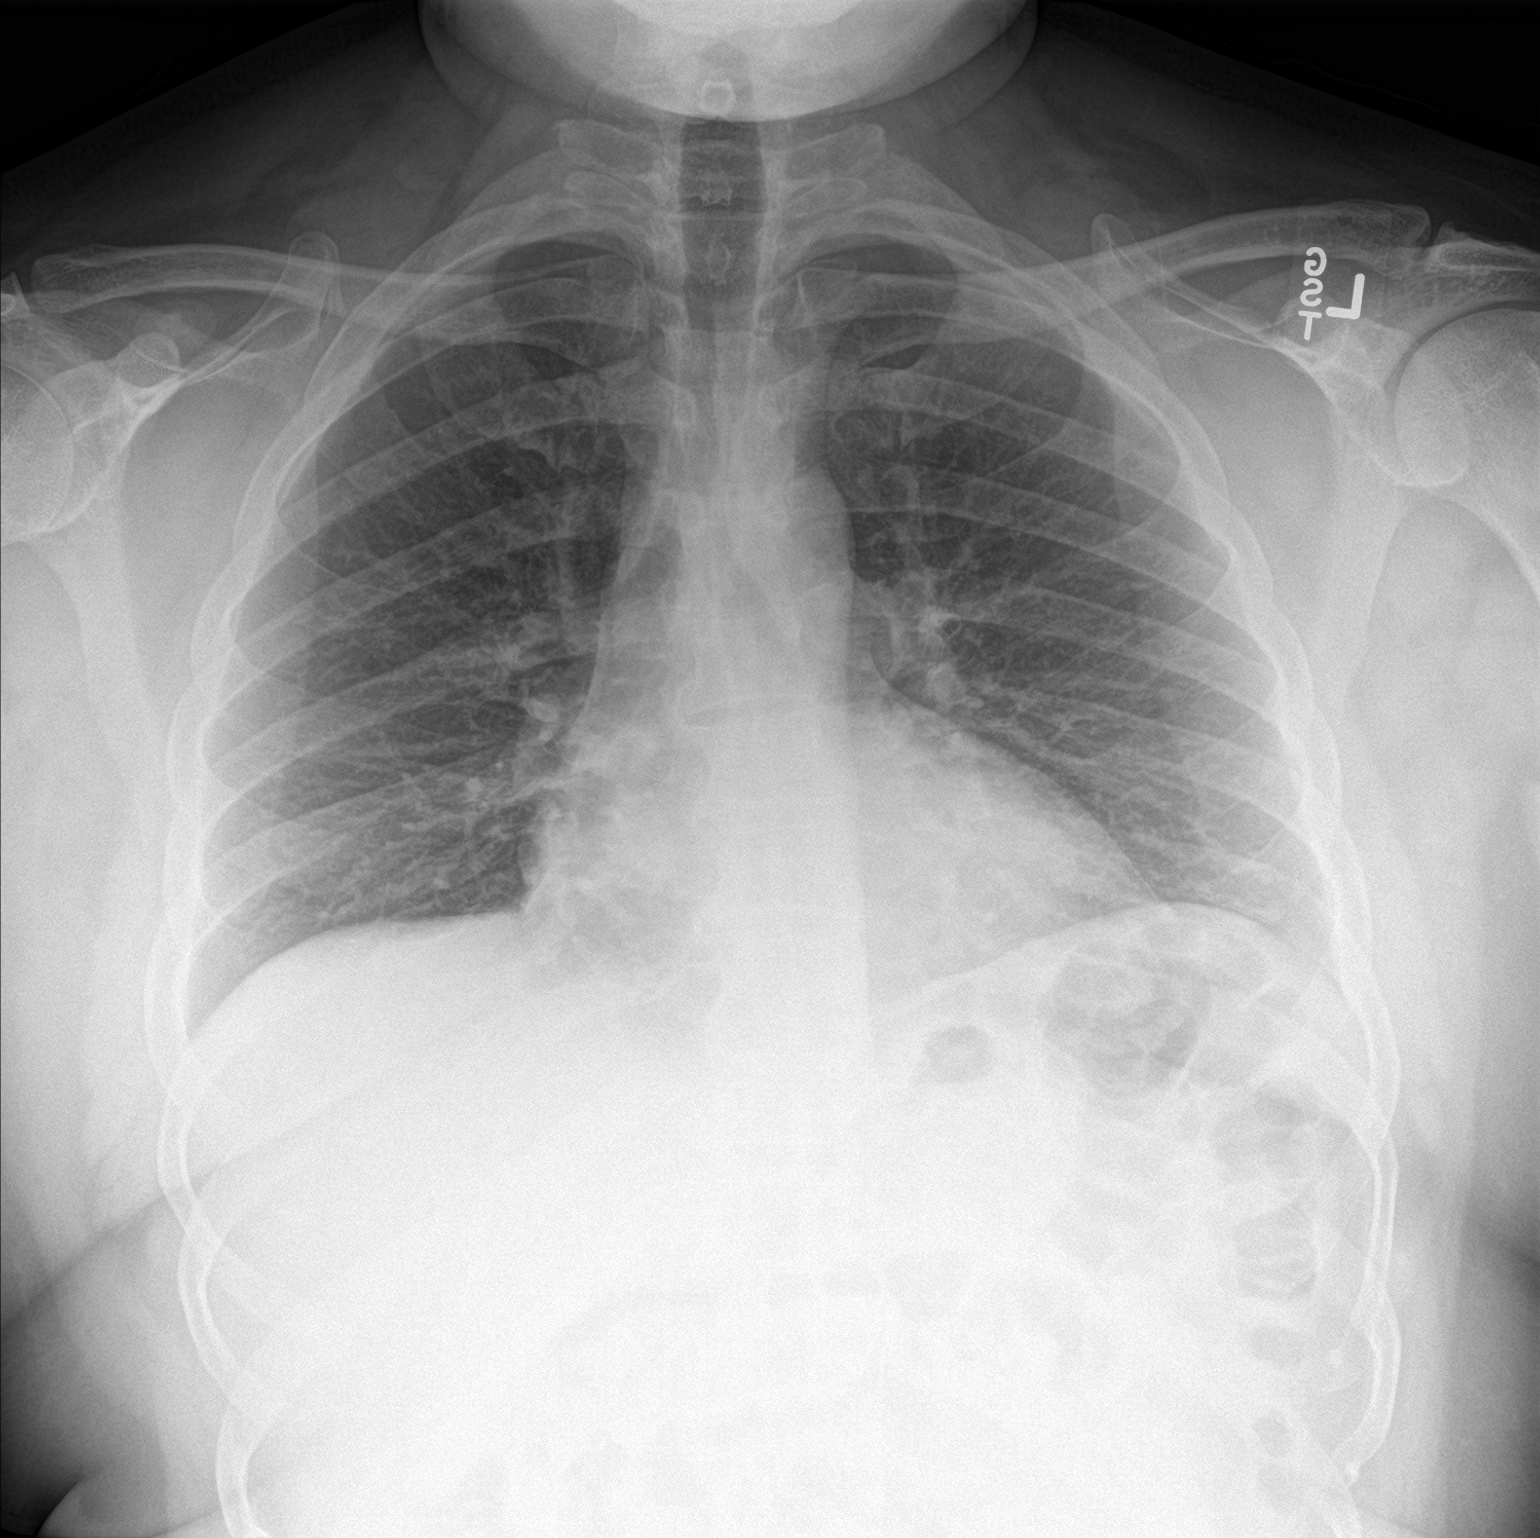

[chest lat]
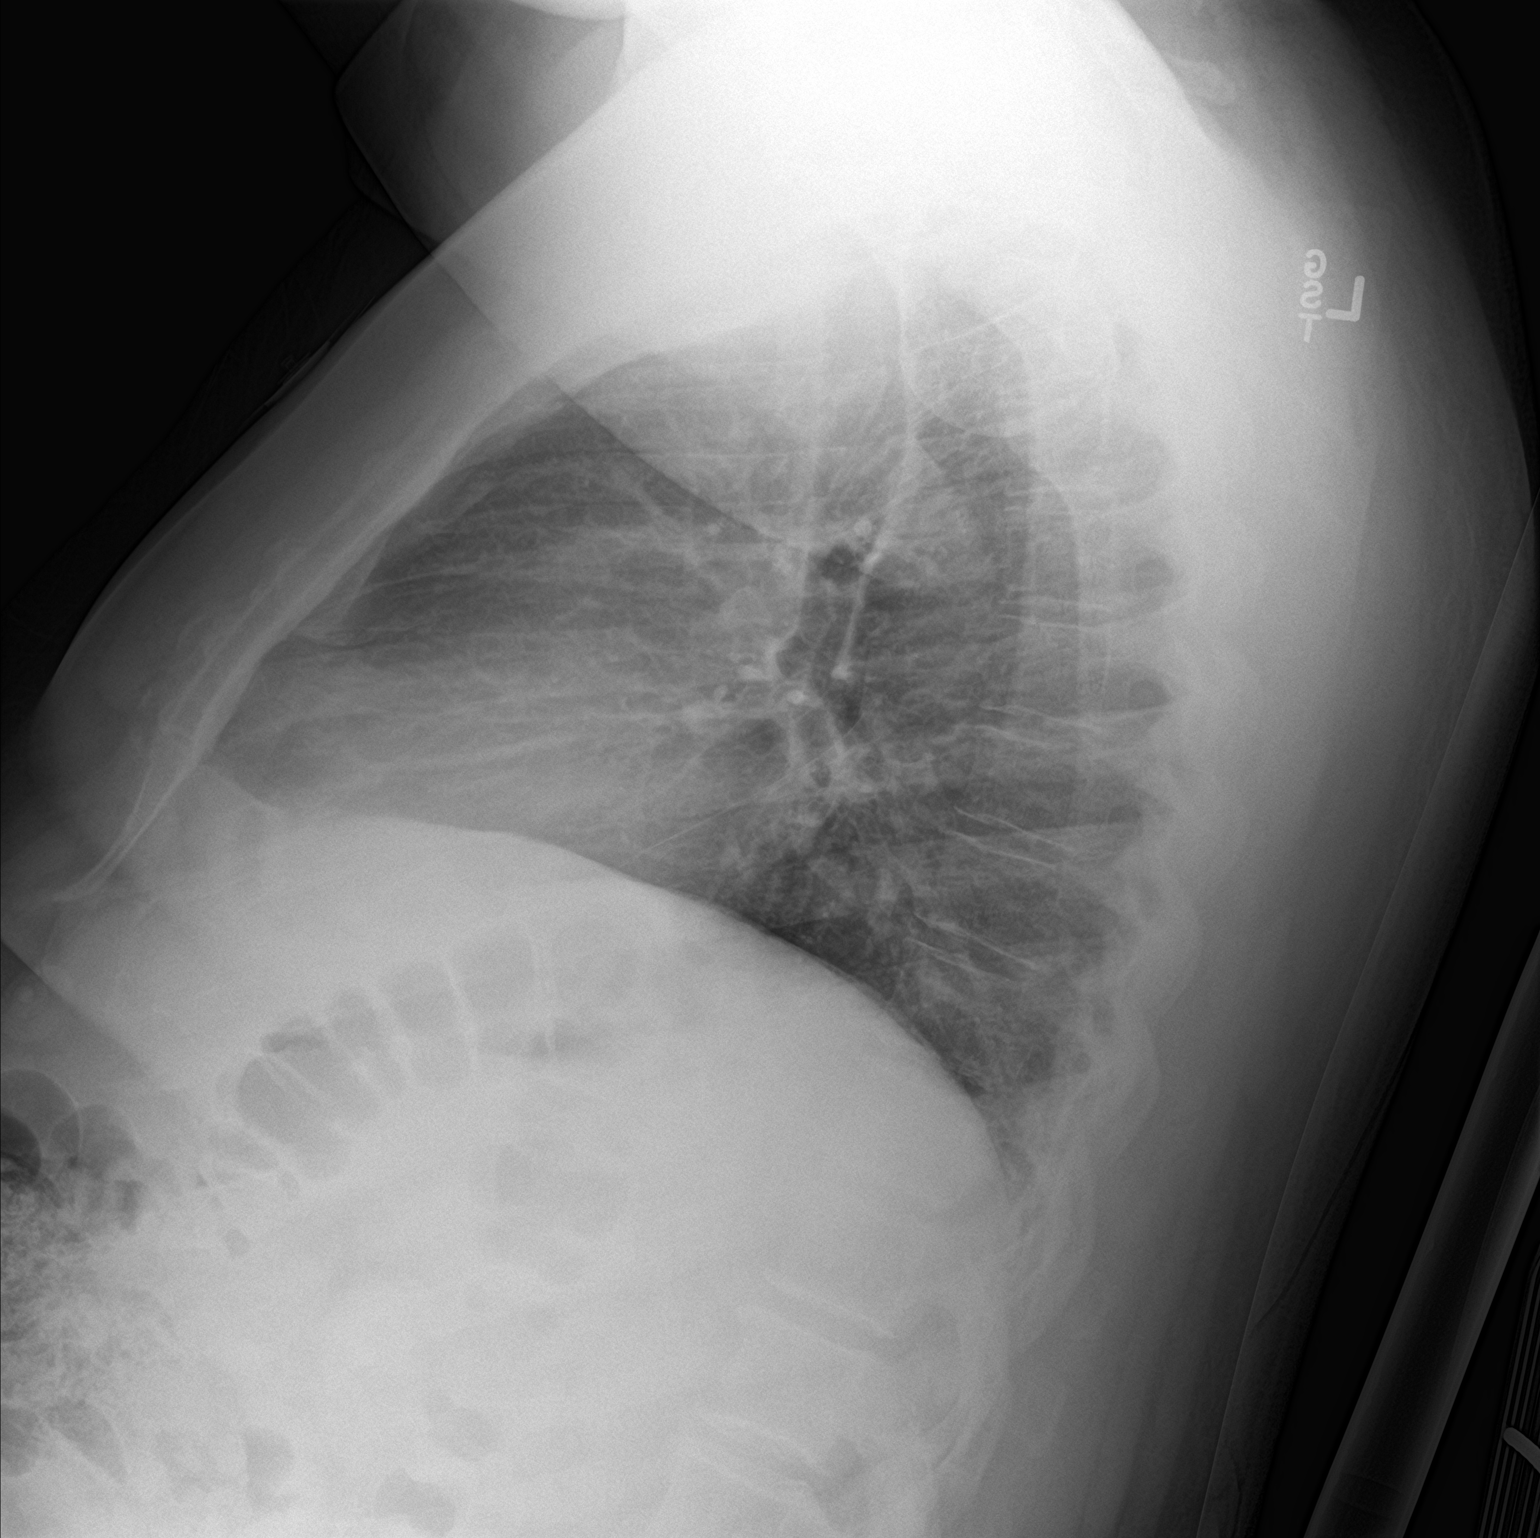

[2 of 2 positions shown; findings below may reference images not displayed]

FINDINGS: The lungs are well-aerated. Minimal left basilar atelectasis is
noted. There is no evidence of pleural effusion or pneumothorax.

The heart is normal in size; the mediastinal contour is within
normal limits. No acute osseous abnormalities are seen.
IMPRESSION: Minimal left basilar atelectasis.  Lungs otherwise clear.

## 2017-05-08 MED ORDER — DOLUTEGRAVIR SODIUM 50 MG PO TABS
50.0000 mg | ORAL_TABLET | Freq: Every day | ORAL | Status: DC
  Filled 2017-05-08 (×3): qty 1

## 2017-05-08 MED ORDER — ADULT MULTIVITAMIN W/MINERALS CH
1.0000 | ORAL_TABLET | Freq: Every day | ORAL | Status: DC
  Administered 2017-05-09 – 2017-05-10 (×2): 1 via ORAL
  Filled 2017-05-08 (×7): qty 1

## 2017-05-08 MED ORDER — IBUPROFEN 400 MG PO TABS: 600.0000 mg | ORAL_TABLET | Freq: Three times a day (TID) | ORAL | Status: DC | PRN

## 2017-05-08 MED ORDER — EMTRICITABINE-TENOFOVIR AF 200-25 MG PO TABS
1.0000 | ORAL_TABLET | Freq: Every day | ORAL | Status: DC
  Administered 2017-05-08: 1 via ORAL
  Filled 2017-05-08: qty 1

## 2017-05-08 MED ORDER — ALBUTEROL SULFATE HFA 108 (90 BASE) MCG/ACT IN AERS: 1.0000 | INHALATION_SPRAY | Freq: Four times a day (QID) | RESPIRATORY_TRACT | Status: DC | PRN

## 2017-05-08 MED ORDER — EMTRICITABINE-TENOFOVIR AF 200-25 MG PO TABS
1.0000 | ORAL_TABLET | Freq: Every day | ORAL | Status: DC
  Filled 2017-05-08 (×3): qty 1

## 2017-05-08 MED ORDER — LORAZEPAM 1 MG PO TABS
1.0000 mg | ORAL_TABLET | Freq: Once | ORAL | Status: AC
  Administered 2017-05-08: 1 mg via ORAL
  Filled 2017-05-08: qty 1

## 2017-05-08 MED ORDER — LORAZEPAM 0.5 MG PO TABS
0.5000 mg | ORAL_TABLET | Freq: Once | ORAL | Status: AC
  Administered 2017-05-08: 0.5 mg via ORAL
  Filled 2017-05-08: qty 1

## 2017-05-08 MED ORDER — ONDANSETRON HCL 4 MG PO TABS: 4.0000 mg | ORAL_TABLET | Freq: Three times a day (TID) | ORAL | Status: DC | PRN

## 2017-05-08 MED ORDER — DOLUTEGRAVIR SODIUM 50 MG PO TABS
50.0000 mg | ORAL_TABLET | Freq: Every day | ORAL | Status: DC
  Administered 2017-05-08: 50 mg via ORAL
  Filled 2017-05-08: qty 1

## 2017-05-08 MED ORDER — ALUM & MAG HYDROXIDE-SIMETH 200-200-20 MG/5ML PO SUSP: 30.0000 mL | Freq: Four times a day (QID) | ORAL | Status: DC | PRN

## 2017-05-08 MED ORDER — NICOTINE 21 MG/24HR TD PT24
21.0000 mg | MEDICATED_PATCH | Freq: Every day | TRANSDERMAL | Status: DC
  Administered 2017-05-09 – 2017-05-10 (×2): 21 mg via TRANSDERMAL
  Filled 2017-05-08 (×5): qty 1

## 2017-05-08 MED ORDER — AMLODIPINE BESYLATE 2.5 MG PO TABS
2.5000 mg | ORAL_TABLET | Freq: Every day | ORAL | Status: DC
  Filled 2017-05-08 (×3): qty 1

## 2017-05-08 NOTE — ED Notes (Signed)
Pt reports his feet is much better after elevating them.

## 2017-05-08 NOTE — ED Triage Notes (Signed)
Reports having sudden onset of racing thoughts, sweating,  And suicidal thoughts tonight.  Hx of anxiety and depression.  Endorses intermittent chest pain.  Not currently on any psych meds but endorses taking them "a long time ago"

## 2017-05-08 NOTE — ED Notes (Signed)
TTS at bedside. 

## 2017-05-08 NOTE — ED Notes (Signed)
Patient transported to X-ray 

## 2017-05-08 NOTE — [facility] Counselor (Signed)
Pt presented to the ED earlier today with complaint of suicidal ideation and other depressive symptoms.  Pt was assessed and recommended for inpatient care.  Pt was reassessed this AM.  He reported that he is not as suicidal as he was earlier.  He requested discharge with referral to outpatient psychiatrist.

## 2017-05-08 NOTE — ED Notes (Signed)
[facility] arrived to transport pt to [facility] 

## 2017-05-08 NOTE — Progress Notes (Signed)
Nursing Progress Note 1900-0730  D) Patient presents pleasant and cooperative this evening but complains of anxiety. Patient denies need for medications at this time. Patient did not attend group this evening but reports "I am just getting used to the routine here. I haven't been here in 10 years". Patient states, "the holidays are hard and this is the anniversary of when I was diagnosed with HIV". Patient states, "I don't think I need in-patient, I just wanted out-patient referral. I don't want to hurt myself. I love my job and want to go back to it". Patient denies SI/HI/AVH or pain. Patient contracts for safety on the unit. Patient complains of mild swelling to his feet bilaterally but denies discomfort at this time. Non-pitting edema noted to bilateral feet. Patient provided an extra pillow and encouraged to rest and elevate his feet.  A) Emotional support given. 1:1 interaction and active listening provided. No medications ordered at this time. Snacks and fluids provided. Opportunities for questions or concerns presented to patient. Patient encouraged to continue to work on treatment goals. Labs, vital signs and patient behavior monitored throughout shift. Patient safety maintained with q15 min safety checks. Low fall risk precautions in place and reviewed with patient; patient verbalized understanding.  R) Patient receptive to interaction with nurse. Patient remains safe on the unit at this time. Patient is resting in bed without complaints. Will continue to monitor.

## 2017-05-08 NOTE — Tx Team (Signed)
Initial Treatment Plan 05/08/2017 6:24 PM Geni BersJames Fujikawa EAV:[id number]    PATIENT STRESSORS: Health problems Occupational concerns   PATIENT STRENGTHS: Ability for insight Active sense of humor Average or above average intelligence Capable of independent living General fund of knowledge Motivation for treatment/growth   PATIENT IDENTIFIED PROBLEMS: Depression Suicidal thoughts "I want a referral for a counselor"                     DISCHARGE CRITERIA:  Ability to meet basic life and health needs Improved stabilization in mood, thinking, and/or behavior Verbal commitment to aftercare and medication compliance  PRELIMINARY DISCHARGE PLAN: Attend aftercare/continuing care group Return to previous living arrangement  PATIENT/FAMILY INVOLVEMENT: This treatment plan has been presented to and reviewed with the patient, Geni BersJames Golden, and/or family member, .  The patient and family have been given the opportunity to ask questions and make suggestions.  Naelani Lafrance, [place], [place]RN 05/08/2017, 6:24 PM
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
## 2017-05-08 NOTE — Progress Notes (Signed)
Alan Rangel is a 41 year old male pt admitted on voluntary basis. He reports that he came into the ED due to some anxiety issues that he was experiencing. He denies SI on admission and is able to contract for safety on the unit. He reports that he has had SI thoughts in the past but not on admission. He reports that he takes his medications as prescribed but reports that he is not on anything for depression. He spoke about how he wants to get a referral to see a psychiatrist. He denies any substance abuse issues and reports that he only uses nicotine.He reports that he has his own place and lives alone and reports that he will go back there after discharge. Alan Rangel was oriented to the unit and safety maintained.

## 2017-05-08 NOTE — ED Notes (Signed)
[facility].  [facility]  Patient going to Room 406-2.  All belongings have been retrieved from locker and security and will be given to [facility] when they arrive.

## 2017-05-08 NOTE — Progress Notes (Addendum)
Per Jacquelyne BalintShalita Forrest , [facility], [facility] 406[facility] ([facility]) ; Accepting provider is Assunta FoundShuvon Rankin, NP; Attending provider is Dr. Jama Flavorsobos.  Patient can arrive at 5:00pm. Number for report is [phone].    Kennyth ArnoldStacy, RN notified.    Baldo DaubJolan Nissan Frazzini, MSW, LCSWA Clinical Social Worker (Disposition) [facility]  [phone]

## 2017-05-08 NOTE — ED Notes (Addendum)
Pt's belongings inventoried and placed in locker #1. Valuables with security - cellphone and wallet. Valuables envelope [id number]

## 2017-05-08 NOTE — ED Notes (Addendum)
Pt reports R foot swelling.  He reports he just noticed it when he walked to [employer].  Denies any pain but states "it feels like there's fluid in the bottom of my foot."  Bila feet swelling noted.  Pedal pulses strong and bounding.  MAE intact.  Instructed pt to elevate his feet.

## 2017-05-08 NOTE — ED Notes (Signed)
Regular lunch tray ordered @ 1110
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
## 2017-05-09 DIAGNOSIS — Z818 Family history of other mental and behavioral disorders: Secondary | ICD-10-CM

## 2017-05-09 DIAGNOSIS — R450 Nervousness: Secondary | ICD-10-CM

## 2017-05-09 DIAGNOSIS — B20 Human immunodeficiency virus [HIV] disease: Secondary | ICD-10-CM

## 2017-05-09 DIAGNOSIS — F4001 Agoraphobia with panic disorder: Secondary | ICD-10-CM

## 2017-05-09 DIAGNOSIS — F419 Anxiety disorder, unspecified: Secondary | ICD-10-CM

## 2017-05-09 DIAGNOSIS — Z62810 Personal history of physical and sexual abuse in childhood: Secondary | ICD-10-CM

## 2017-05-09 DIAGNOSIS — F250 Schizoaffective disorder, bipolar type: Principal | ICD-10-CM

## 2017-05-09 DIAGNOSIS — Z811 Family history of alcohol abuse and dependence: Secondary | ICD-10-CM

## 2017-05-09 DIAGNOSIS — F17210 Nicotine dependence, cigarettes, uncomplicated: Secondary | ICD-10-CM

## 2017-05-09 MED ORDER — EMTRICITABINE-TENOFOVIR AF 200-25 MG PO TABS
1.0000 | ORAL_TABLET | Freq: Every day | ORAL | Status: DC
  Administered 2017-05-09: 1 via ORAL
  Filled 2017-05-09 (×3): qty 1

## 2017-05-09 MED ORDER — DOLUTEGRAVIR SODIUM 50 MG PO TABS
50.0000 mg | ORAL_TABLET | Freq: Every day | ORAL | Status: DC
  Administered 2017-05-09: 50 mg via ORAL
  Filled 2017-05-09 (×3): qty 1

## 2017-05-09 MED ORDER — CLONAZEPAM 0.5 MG PO TABS
0.5000 mg | ORAL_TABLET | Freq: Two times a day (BID) | ORAL | Status: DC | PRN
  Administered 2017-05-09 – 2017-05-10 (×2): 0.5 mg via ORAL
  Filled 2017-05-09 (×2): qty 1

## 2017-05-09 MED ORDER — DULOXETINE HCL 30 MG PO CPEP
30.0000 mg | ORAL_CAPSULE | Freq: Every day | ORAL | Status: DC
  Administered 2017-05-09 – 2017-05-10 (×2): 30 mg via ORAL
  Filled 2017-05-09 (×4): qty 1

## 2017-05-09 MED ORDER — LAMOTRIGINE 100 MG PO TABS
100.0000 mg | ORAL_TABLET | Freq: Every day | ORAL | Status: DC
  Administered 2017-05-09: 100 mg via ORAL
  Filled 2017-05-09 (×4): qty 1

## 2017-05-09 NOTE — Progress Notes (Addendum)
Patient ID: Geni BersJames Bolt, male   DOB: 1975-08-23, 41 y.o.   MRN: [id number]  Pt currently presents with a flat affect and cooperative behavior. Pt reports to Clinical research associatewriter that their goal is to "work on changing my medications." Pt states "It's all going well." Pt reports good sleep with current medication regimen. Feels that his as needed Klonopin is helping with his anxiety.   Pt's labs and vitals were monitored throughout the night. Pt given a 1:1 about emotional and mental status. Pt supported and encouraged to express concerns and questions. Pt educated on medications.  Pt's safety ensured with 15 minute and environmental checks. Pt currently denies SI/HI and A/V hallucinations. Pt verbally agrees to seek staff if SI/HI or A/VH occurs and to consult with staff before acting on any harmful thoughts. Will continue POC.
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
## 2017-05-09 NOTE — Progress Notes (Signed)
Patient ID: Alan Rangel, male   DOB: [birth year], 41 y.o.   MRN: [id number]  Pt reports to writer that she was told by his doctor to take his TIVICAY and DESCOVY at nighttime. Pt experiences drowsiness after taking these medications. Pt refused morning scheduled medications (see MAR), requests to take them tonight. Provider notified, see MAR.

## 2017-05-09 NOTE — Progress Notes (Signed)
DAR NOTE: Patient presents with anxious affect and depressed mood. Pt has been visible in the dayroom interacting with peers. Pt requested to be discharged because he has an appointment with his doctor and also to go work. Pt reports good sleep, good appetite, normal energy, and good concentration. Denies pain, auditory and visual hallucinations.  Rates depression at 0, hopelessness at 0, and anxiety at 0.  Maintained on routine safety checks.  Medications given as prescribed.  Support and encouragement offered as needed.  States goal for today is "getting discharged and going back to work."  Patient observed socializing with peers in the dayroom.  Offered no complaint.
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
## 2017-05-10 DIAGNOSIS — F4001 Agoraphobia with panic disorder: Secondary | ICD-10-CM

## 2017-05-10 LAB — TSH: TSH: 0.701 u[IU]/mL (ref 0.350–4.500)

## 2017-05-10 MED ORDER — DULOXETINE HCL 30 MG PO CPEP: 30.0000 mg | ORAL_CAPSULE | Freq: Every day | ORAL | 0 refills | Status: DC

## 2017-05-10 MED ORDER — LAMOTRIGINE 100 MG PO TABS: 100.0000 mg | ORAL_TABLET | Freq: Every day | ORAL | 0 refills | Status: DC

## 2017-05-10 MED ORDER — CLONAZEPAM 0.5 MG PO TABS: 0.5000 mg | ORAL_TABLET | Freq: Two times a day (BID) | ORAL | 0 refills | Status: DC | PRN

## 2017-05-10 NOTE — [facility] Suicide Risk Assessment (Signed)
[facility] INPATIENT:  Family/Significant Other Suicide Prevention Education  Suicide Prevention Education:  Patient Refusal for Family/Significant Other Suicide Prevention Education: The patient Alan Rangel has refused to provide written consent for family/significant other to be provided Family/Significant Other Suicide Prevention Education during admission and/or prior to discharge.  Physician notified.  Patient reports that he does not have any family or significant others to complete suicidal prevention education with. Patient lives alone. No family.   Baldo DaubRodney B [facility] 05/10/2017, 8:56 AM

## 2017-05-10 NOTE — Progress Notes (Addendum)
  [facility] :  Will you be returning to the same living situation after discharge:  Yes,  home At discharge, do you have transportation home?: Yes,  bus pass Do you have the ability to pay for your medications: Yes,  [employer] medicare  Release of information consent forms completed and submitted to medical records by CSW.  Patient to Follow up at: Follow-up Information    [facility] Follow up on 05/15/2017.   Specialty:  Behavioral Health Why:  Hospital follow-up appt on Wed 05/15/17 at 8:00AM. Please bring: picture ID and insurance card. Thank you.   Contact information: [address] [place] [address] [phone]           Next level of care provider has [facility] to [facility] Link:no  Safety Planning and Suicide Prevention discussed: Yes,  SPE completed with pt; pt declined to consent to family contact.   Have you used any form of tobacco in the last 30 days? (Cigarettes, Smokeless Tobacco, Cigars, and/or Pipes): Yes  Has patient been referred to the Quitline?: Patient refused referral  Patient has been referred for addiction treatment: Yes  Pulte HomesHeather N Smart, LCSW 05/10/2017, 9:59 AM
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
## 2017-05-10 NOTE — Progress Notes (Signed)
Recreation Therapy Notes  Date: 05/10/17 Time: 0930 Location: [address] Dayroom  Group Topic: Stress Management  Goal Area(s) Addresses:  Patient will verbalize importance of using healthy stress management.  Patient will identify positive emotions associated with healthy stress management.   Intervention: Stress Management  Activity :  Body Scan Meditation.  LRT introduced the stress management technique of meditation.  LRT played a meditation from the Calm app that guided them through a body scan to allow them to become aware of any sensations, tensions or uneasy feelings they may be experiencing.  Education:  Stress Management, Discharge Planning.   Education Outcome: Acknowledges edcuation/In group clarification offered/Needs additional education  Clinical Observations/Feedback: Pt did not attend group.    Caroll RancherMarjette Mayela Bullard, LRT/CTRS         Lillia AbedLindsay, Meredith Mells A 05/10/2017 11:07 AM

## 2017-05-10 NOTE — Progress Notes (Signed)
Pt attend wrap up group. His day was a 8. His goal was to make sure he talk to the nurse and the social worker because he is missing time from work and he's concern about his job. He glad to be here. He's release stress and there is a lot on his mind.

## 2017-05-10 NOTE — Progress Notes (Signed)
  [facility] :  Will you be returning to the same living situation after discharge:  Yes,  home  At discharge, do you have transportation home?: Yes,  self  Do you have the ability to pay for your medications: Yes,  insurance   Release of information consent forms completed and in the chart;  Patient's signature needed at discharge.  Patient to Follow up at: Follow-up Information    [facility] Follow up on 05/15/2017.   Specialty:  Behavioral Health Why:  Hospital follow-up appt on Wed 05/15/17 at 8:00AM. Please bring: picture ID and insurance card. Thank you.   Contact information: [address] [place] [address] [phone]           Next level of care provider [facility]:no  Safety Planning and Suicide Prevention discussed: Yes,  with pt  Have you used any form of tobacco in the last 30 days? (Cigarettes, Smokeless Tobacco, Cigars, and/or Pipes): Yes  Has patient been referred to the Quitline?: Patient refused referral  Patient has been referred for addiction treatment: Yes  Blayton Huttner L Aryaa Bunting, LCSW 05/10/2017, 3:05 PM

## 2017-05-10 NOTE — Progress Notes (Signed)
Patient discharged to lobby. Patient was stable and appreciative at that time. All papers and prescriptions were given and valuables returned. Verbal understanding expressed. Denies SI/HI and A/VH. Patient given opportunity to express concerns and ask questions.  

## 2017-05-10 NOTE — Tx Team (Signed)
Interdisciplinary Treatment and Diagnostic Plan Update  05/10/2017 Time of Session: 0830AM Alan BersJames Vath MRN: [id number]  Principal Diagnosis: Schizoaffective Disorder  Secondary Diagnoses: Active Problems:   Schizoaffective disorder, bipolar type (HCC)   Panic disorder with agoraphobia   Current Medications:  Current Facility-Administered Medications  Medication Dose Route Frequency Provider Last Rate Last Dose  . albuterol (PROVENTIL HFA;VENTOLIN HFA) 108 (90 Base) MCG/ACT inhaler 1-2 puff  1-2 puff Inhalation Q6H PRN Rankin, Shuvon B, NP      . clonazePAM (KLONOPIN) tablet 0.5 mg  0.5 mg Oral BID PRN Cobos, Rockey SituFernando A, MD   0.5 mg at 05/10/17 0807  . dolutegravir (TIVICAY) tablet 50 mg  50 mg Oral Daily Nira ConnBerry, Jason A, NP   50 mg at 05/09/17 2208  . DULoxetine (CYMBALTA) DR capsule 30 mg  30 mg Oral Daily Cobos, Rockey SituFernando A, MD   30 mg at 05/10/17 0806  . emtricitabine-tenofovir AF (DESCOVY) 200-25 MG per tablet 1 tablet  1 tablet Oral Daily Nira ConnBerry, Jason A, NP   1 tablet at 05/09/17 2208  . lamoTRIgine (LAMICTAL) tablet 100 mg  100 mg Oral Daily Cobos, Fernando A, MD   100 mg at 05/09/17 1216  . multivitamin with minerals tablet 1 tablet  1 tablet Oral Daily Rankin, Shuvon B, NP   1 tablet at 05/10/17 0806  . nicotine (NICODERM CQ - dosed in mg/24 hours) patch 21 mg  21 mg Transdermal Daily Cobos, Rockey SituFernando A, MD   21 mg at 05/10/17 0808   PTA Medications: Medications Prior to Admission  Medication Sig Dispense Refill Last Dose  . albuterol (PROVENTIL HFA;VENTOLIN HFA) 108 (90 Base) MCG/ACT inhaler Inhale 1-2 puffs into the lungs every 6 (six) hours as needed for wheezing or shortness of breath.   unk  . amLODipine (NORVASC) 2.5 MG tablet Take 2.5 mg by mouth daily.   05/07/2017 at Unknown time  . dolutegravir (TIVICAY) 50 MG tablet Take 1 tablet (50 mg total) by mouth daily. 30 tablet 2 05/07/2017 at Unknown time  . emtricitabine-tenofovir AF (DESCOVY) 200-25 MG tablet Take 1 tablet by  mouth daily. 30 tablet 2 05/07/2017 at Unknown time  . ibuprofen (ADVIL,MOTRIN) 200 MG tablet Take 200 mg by mouth every 6 (six) hours as needed (pain).   unk  . Multiple Vitamin (MULTIVITAMIN WITH MINERALS) TABS tablet Take 1 tablet by mouth daily.   05/07/2017 at Unknown time    Patient Stressors: Health problems Occupational concerns  Patient Strengths: Ability for insight Active sense of humor Average or above average intelligence Capable of independent living General fund of knowledge Motivation for treatment/growth  Treatment Modalities: Medication Management, Group therapy, Case management,  1 to 1 session with clinician, Psychoeducation, Recreational therapy.   Physician Treatment Plan for Primary Diagnosis: Schizoaffective Disorder Long Term Goal(s): Improvement in symptoms so as ready for discharge Improvement in symptoms so as ready for discharge   Short Term Goals: Ability to identify changes in lifestyle to reduce recurrence of condition will improve Ability to maintain clinical measurements within normal limits will improve Ability to identify changes in lifestyle to reduce recurrence of condition will improve Ability to verbalize feelings will improve Ability to disclose and discuss suicidal ideas Ability to demonstrate self-control will improve Ability to identify and develop effective coping behaviors will improve  Medication Management: Evaluate patient's response, side effects, and tolerance of medication regimen.  Therapeutic Interventions: 1 to 1 sessions, Unit Group sessions and Medication administration.  Evaluation of Outcomes: Adequate for Discharge  Physician  Treatment Plan for Secondary Diagnosis: Active Problems:   Schizoaffective disorder, bipolar type (HCC)   Panic disorder with agoraphobia  Long Term Goal(s): Improvement in symptoms so as ready for discharge Improvement in symptoms so as ready for discharge   Short Term Goals: Ability to  identify changes in lifestyle to reduce recurrence of condition will improve Ability to maintain clinical measurements within normal limits will improve Ability to identify changes in lifestyle to reduce recurrence of condition will improve Ability to verbalize feelings will improve Ability to disclose and discuss suicidal ideas Ability to demonstrate self-control will improve Ability to identify and develop effective coping behaviors will improve     Medication Management: Evaluate patient's response, side effects, and tolerance of medication regimen.  Therapeutic Interventions: 1 to 1 sessions, Unit Group sessions and Medication administration.  Evaluation of Outcomes: Adequate for Discharge   RN Treatment Plan for Primary Diagnosis: Schizoaffective Disorder Long Term Goal(s): Knowledge of disease and therapeutic regimen to maintain health will improve  Short Term Goals: Ability to remain free from injury will improve, Ability to demonstrate self-control and Ability to identify and develop effective coping behaviors will improve  Medication Management: RN will administer medications as ordered by provider, will assess and evaluate patient's response and provide education to patient for prescribed medication. RN will report any adverse and/or side effects to prescribing provider.  Therapeutic Interventions: 1 on 1 counseling sessions, Psychoeducation, Medication administration, Evaluate responses to treatment, Monitor vital signs and CBGs as ordered, Perform/monitor CIWA, COWS, AIMS and Fall Risk screenings as ordered, Perform wound care treatments as ordered.  Evaluation of Outcomes: Adequate for Discharge   LCSW Treatment Plan for Primary Diagnosis: Schizoaffective Disorder Long Term Goal(s): Safe transition to appropriate next level of care at discharge, Engage patient in therapeutic group addressing interpersonal concerns.  Short Term Goals: Engage patient in aftercare planning  with referrals and resources, Facilitate patient progression through stages of change regarding substance use diagnoses and concerns and Identify triggers associated with mental health/substance abuse issues  Therapeutic Interventions: Assess for all discharge needs, 1 to 1 time with Social worker, Explore available resources and support systems, Assess for adequacy in community support network, Educate family and significant other(s) on suicide prevention, Complete Psychosocial Assessment, Interpersonal group therapy.  Evaluation of Outcomes: Adequate for Discharge   Progress in Treatment: Attending groups: Yes. Participating in groups: Yes. Taking medication as prescribed: Yes. Toleration medication: Yes. Family/Significant other contact made: SPE completed with pt; pt declined to consent to family contact.  Patient understands diagnosis: Yes. Discussing patient identified problems/goals with staff: Yes. Medical problems stabilized or resolved: Yes. Denies suicidal/homicidal ideation: Yes. Issues/concerns per patient self-inventory: No. Other: n/a   New problem(s) identified: No, Describe:  n/a  New Short Term/Long Term Goal(s):  Discharge Plan or Barriers:   Reason for Continuation of Hospitalization: none  Estimated Length of Stay: Friday, 05/10/17  Attendees: Patient: 05/10/2017 10:05 AM  Physician: Dr. Jama Flavorsobos MD 05/10/2017 10:05 AM  Nursing: Earl LagosPenny, Christa RN 05/10/2017 10:05 AM  RN Care Manager:x 05/10/2017 10:05 AM  Social Worker: Chartered loss adjusterHeather Smart, LCSW 05/10/2017 10:05 AM  Recreational Therapist: x 05/10/2017 10:05 AM  Other: Armandina StammerAgnes Nwoko NP; Feliz Beamravis Money NP 05/10/2017 10:05 AM  Other:  05/10/2017 10:05 AM  Other: 05/10/2017 10:05 AM    Scribe for Treatment Team: Ledell PeoplesHeather N Smart, LCSW 05/10/2017 10:05 AM

## 2017-05-28 ENCOUNTER — Ambulatory Visit: Payer: Self-pay | Admitting: Internal Medicine

## 2017-06-24 ENCOUNTER — Encounter: Payer: Self-pay | Admitting: Infectious Diseases

## 2017-06-24 ENCOUNTER — Other Ambulatory Visit: Payer: Self-pay

## 2017-06-24 ENCOUNTER — Ambulatory Visit (INDEPENDENT_AMBULATORY_CARE_PROVIDER_SITE_OTHER): Payer: Medicare HMO | Admitting: Infectious Diseases

## 2017-06-24 ENCOUNTER — Other Ambulatory Visit: Payer: Self-pay | Admitting: Internal Medicine

## 2017-06-24 ENCOUNTER — Other Ambulatory Visit: Payer: Medicare HMO

## 2017-06-24 VITALS — BP 136/88 | HR 60 | Temp 98.7°F

## 2017-06-24 DIAGNOSIS — R252 Cramp and spasm: Secondary | ICD-10-CM | POA: Diagnosis not present

## 2017-06-24 DIAGNOSIS — B20 Human immunodeficiency virus [HIV] disease: Secondary | ICD-10-CM

## 2017-06-24 DIAGNOSIS — J029 Acute pharyngitis, unspecified: Secondary | ICD-10-CM | POA: Insufficient documentation

## 2017-06-24 MED ORDER — AMOXICILLIN-POT CLAVULANATE 875-125 MG PO TABS: 1.0000 | ORAL_TABLET | Freq: Two times a day (BID) | ORAL | 0 refills | Status: AC

## 2017-06-24 NOTE — Assessment & Plan Note (Signed)
Will check electrolytes including magnesium today. He reports the timing of these symptoms are c/w when he switched to Tivicay / Descovy regimen. He drinks A LOT of soda. I asked him to set a goal to cut soda intake at least in half by his next visit and replace with water (flavored or plain) to help keep him hydrated. Also suggested trying mustard to see if this helps symptoms. Will advise further based on labs.
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
## 2017-06-24 NOTE — Assessment & Plan Note (Addendum)
Oropharyngeal edema and erythema noted without exudative. Differential includes viral pharyngitis, GAS pharyngitis, r/t poor dentition and decaying tooth. He has headaches, chills/subjective fever and no other signs of viral illness. I was going to obtain swab and culture for GAS however with decision to give amox-clav for tooth this will adequately cover GAS and would not change tx. Advised salt water gargles, tylenol with sparing ibuprofen use to help with pain control - explained that for short term use the ibuprofen is OK however not a good option for long term use in combination with TAF. Advised to return for re-evaluation either with PCP or with ID clinic should these symptoms not improve.   I will get him re-established with our dental clinic as well. He drinks up to 4L of non-diet soda daily which is ruining his teeth and leading to decay. Advised to set a goal to cut down soda by next visit and replace with water/seltzer water or non-sugary beverage.

## 2017-06-24 NOTE — Assessment & Plan Note (Signed)
Alan Rangel previously had full reconstitution of his immune system on ART however he had detectable virus ~3k copies with last VL. He reports excellent adherence since last OV. Will recheck labs today for a current assessment of his HIV status. He wishes he could continue with STR however happy with his current regimen. I will continue his Tivicay + Descovy. He will return for follow up with Dr. [name] in 6 months with labs 2 weeks prior.   HIV 1 RNA Quant (copies/mL)  Date Value  01/30/2016 3,816 (H)  07/28/2015 <20  08/25/2014 42 (H)   CD4 (no units)  Date Value  01/30/2016 2,050  08/25/2014 1,376  06/09/2014 1,342   CD4 T Cell Abs (/uL)  Date Value  06/28/2016 1,620  07/28/2015 1,880

## 2017-06-24 NOTE — Patient Instructions (Addendum)
I will give you an antibiotic today to help with tooth related infection that could be causing your sore throat. You should take this until finished one tab twice a day with food.   Make sure you drink plenty of water to make sure you are getting enough hydration. Your sodas need to go! Start decreasing your intake because they will ruin your teeth and make you more dehydrated.   Please come back to see Dr. Drue SecondSnider in 6 months for follow up.   Will be in touch with your labs.

## 2017-06-25 ENCOUNTER — Encounter: Payer: Self-pay | Admitting: Internal Medicine

## 2017-06-25 LAB — COMPLETE METABOLIC PANEL WITH GFR
AG Ratio: 1.9 (calc) (ref 1.0–2.5)
ALBUMIN MSPROF: 4.5 g/dL (ref 3.6–5.1)
ALKALINE PHOSPHATASE (APISO): 125 U/L — AB (ref 40–115)
ALT: 17 U/L (ref 9–46)
AST: 13 U/L (ref 10–40)
BUN/Creatinine Ratio: 5 (calc) — ABNORMAL LOW (ref 6–22)
BUN: 6 mg/dL — AB (ref 7–25)
CALCIUM: 9.2 mg/dL (ref 8.6–10.3)
CO2: 25 mmol/L (ref 20–32)
CREATININE: 1.19 mg/dL (ref 0.60–1.35)
Chloride: 104 mmol/L (ref 98–110)
GFR, EST NON AFRICAN AMERICAN: 76 mL/min/{1.73_m2} (ref >=60)
GFR, Est African American: 89 mL/min/{1.73_m2} (ref >=60)
GLUCOSE: 84 mg/dL (ref 65–99)
Globulin: 2.4 g/dL (calc) (ref 1.9–3.7)
Potassium: 4.4 mmol/L (ref 3.5–5.3)
Sodium: 143 mmol/L (ref 135–146)
Total Bilirubin: 0.2 mg/dL (ref 0.2–1.2)
Total Protein: 6.9 g/dL (ref 6.1–8.1)

## 2017-06-25 LAB — LIPID PANEL
Cholesterol: 207 mg/dL — ABNORMAL HIGH (ref <200)
HDL: 32 mg/dL — ABNORMAL LOW (ref >40)
LDL CHOLESTEROL (CALC): 134 mg/dL — AB
Non-HDL Cholesterol (Calc): 175 mg/dL (calc) — ABNORMAL HIGH (ref <130)
TRIGLYCERIDES: 269 mg/dL — AB (ref <150)
Total CHOL/HDL Ratio: 6.5 (calc) — ABNORMAL HIGH (ref <5.0)

## 2017-06-25 LAB — CBC WITH DIFFERENTIAL/PLATELET
Basophils Absolute: 23 cells/uL (ref 0–200)
Basophils Relative: 0.3 %
Eosinophils Absolute: 113 cells/uL (ref 15–500)
Eosinophils Relative: 1.5 %
HCT: 44.3 % (ref 38.5–50.0)
Hemoglobin: 15.3 g/dL (ref 13.2–17.1)
Lymphs Abs: 3945 cells/uL — ABNORMAL HIGH (ref 850–3900)
MCH: 31.6 pg (ref 27.0–33.0)
MCHC: 34.5 g/dL (ref 32.0–36.0)
MCV: 91.5 fL (ref 80.0–100.0)
MPV: 10 fL (ref 7.5–12.5)
Monocytes Relative: 6.6 %
NEUTROS PCT: 39 %
Neutro Abs: 2925 cells/uL (ref 1500–7800)
PLATELETS: 223 10*3/uL (ref 140–400)
RBC: 4.84 10*6/uL (ref 4.20–5.80)
RDW: 12.4 % (ref 11.0–15.0)
TOTAL LYMPHOCYTE: 52.6 %
WBC: 7.5 10*3/uL (ref 3.8–10.8)
WBCMIX: 495 {cells}/uL (ref 200–950)

## 2017-06-25 LAB — HEPATITIS C ANTIBODY
Hepatitis C Ab: NONREACTIVE
SIGNAL TO CUT-OFF: 0.02 (ref <1.00)

## 2017-06-25 LAB — T-HELPER CELL (CD4) - (RCID CLINIC ONLY)
CD4 T CELL ABS: 1330 /uL (ref 400–2700)
CD4 T CELL HELPER: 31 % — AB (ref 33–55)

## 2017-06-25 LAB — RPR: RPR Ser Ql: NONREACTIVE

## 2017-06-26 ENCOUNTER — Other Ambulatory Visit: Payer: Self-pay | Admitting: Internal Medicine

## 2017-06-26 LAB — HIV-1 RNA QUANT-NO REFLEX-BLD
HIV 1 RNA QUANT: NOT DETECTED {copies}/mL
HIV-1 RNA Quant, Log: <1.3 Log copies/mL

## 2017-09-02 ENCOUNTER — Other Ambulatory Visit: Payer: Self-pay | Admitting: Internal Medicine

## 2017-10-11 DIAGNOSIS — F411 Generalized anxiety disorder: Secondary | ICD-10-CM | POA: Insufficient documentation

## 2017-10-11 DIAGNOSIS — F320 Major depressive disorder, single episode, mild: Secondary | ICD-10-CM | POA: Insufficient documentation

## 2017-10-15 ENCOUNTER — Ambulatory Visit: Payer: Self-pay | Admitting: Internal Medicine

## 2017-10-17 ENCOUNTER — Ambulatory Visit: Payer: Self-pay | Admitting: Internal Medicine

## 2017-11-19 ENCOUNTER — Telehealth: Payer: Self-pay

## 2017-11-19 ENCOUNTER — Ambulatory Visit (INDEPENDENT_AMBULATORY_CARE_PROVIDER_SITE_OTHER): Payer: Medicare HMO | Admitting: Family

## 2017-11-19 ENCOUNTER — Other Ambulatory Visit: Payer: Self-pay

## 2017-11-19 ENCOUNTER — Other Ambulatory Visit: Payer: Medicare HMO

## 2017-11-19 ENCOUNTER — Encounter (HOSPITAL_COMMUNITY): Payer: Self-pay | Admitting: Emergency Medicine

## 2017-11-19 ENCOUNTER — Emergency Department (HOSPITAL_COMMUNITY)
Admission: EM | Admit: 2017-11-19 | Discharge: 2017-11-19 | Disposition: A | Discharge status: Home / Self Care | Payer: Medicare HMO | Attending: Emergency Medicine | Admitting: Emergency Medicine

## 2017-11-19 ENCOUNTER — Encounter: Payer: Self-pay | Admitting: Family

## 2017-11-19 VITALS — BP 144/96 | HR 78 | Temp 97.9°F | Resp 22 | Ht 72.0 in | Wt 318.0 lb

## 2017-11-19 DIAGNOSIS — G8929 Other chronic pain: Secondary | ICD-10-CM | POA: Insufficient documentation

## 2017-11-19 DIAGNOSIS — M545 Low back pain, unspecified: Secondary | ICD-10-CM | POA: Insufficient documentation

## 2017-11-19 DIAGNOSIS — R0602 Shortness of breath: Secondary | ICD-10-CM | POA: Diagnosis not present

## 2017-11-19 DIAGNOSIS — Z5321 Procedure and treatment not carried out due to patient leaving prior to being seen by health care provider: Secondary | ICD-10-CM | POA: Diagnosis not present

## 2017-11-19 DIAGNOSIS — I83811 Varicose veins of right lower extremities with pain: Secondary | ICD-10-CM | POA: Diagnosis not present

## 2017-11-19 DIAGNOSIS — R2240 Localized swelling, mass and lump, unspecified lower limb: Secondary | ICD-10-CM | POA: Insufficient documentation

## 2017-11-19 DIAGNOSIS — B20 Human immunodeficiency virus [HIV] disease: Secondary | ICD-10-CM

## 2017-11-19 LAB — CBC
HEMATOCRIT: 46.6 % (ref 39.0–52.0)
HEMOGLOBIN: 15.2 g/dL (ref 13.0–17.0)
MCH: 31.7 pg (ref 26.0–34.0)
MCHC: 32.6 g/dL (ref 30.0–36.0)
MCV: 97.1 fL (ref 78.0–100.0)
Platelets: 226 10*3/uL (ref 150–400)
RBC: 4.8 MIL/uL (ref 4.22–5.81)
RDW: 13.5 % (ref 11.5–15.5)
WBC: 8.3 10*3/uL (ref 4.0–10.5)

## 2017-11-19 LAB — BASIC METABOLIC PANEL
ANION GAP: 8 (ref 5–15)
BUN: 9 mg/dL (ref 6–20)
CHLORIDE: 104 mmol/L (ref 98–111)
CO2: 30 mmol/L (ref 22–32)
Calcium: 9.3 mg/dL (ref 8.9–10.3)
Creatinine, Ser: 1.27 mg/dL — ABNORMAL HIGH (ref 0.61–1.24)
GFR calc Af Amer: >60 mL/min (ref >60)
Glucose, Bld: 107 mg/dL — ABNORMAL HIGH (ref 70–99)
POTASSIUM: 4.3 mmol/L (ref 3.5–5.1)
SODIUM: 142 mmol/L (ref 135–145)

## 2017-11-19 LAB — I-STAT TROPONIN, ED: Troponin i, poc: 0 ng/mL (ref 0.00–0.08)

## 2017-11-19 NOTE — Telephone Encounter (Signed)
Patient requested to speak with nurse after his scheduled lab visit.  He has complaints of bilateral swollen ankles, with foot numbness and slight shortness of breath.  He says this has been going on for 4 days.    We have no available appointments so he was advised [facility]. I suggested he call today and he stated his care was at the [facility].  I suggested he establish primary care or go to local urgent care.   Laurell Josephsammy K King, RN    Called patient after he left to offer appointment since there was a cancellation and he was already in the emergency room.

## 2017-11-19 NOTE — Progress Notes (Signed)
Subjective:    Patient ID: Alan Rangel, male    DOB: 05/10/1976, 42 y.o.   MRN: [id number]  Chief Complaint  Patient presents with  . sick visit    c/o bilateral lower extremity swelling, SOB     HPI:  Alan BersJames Nau is a 42 y.o. male who presents today for an acute office visit.   This is a new problem. Associated symptom of a raised veins located in his right proximal and distal leg have been going on for about 4 days. He describes them as itchy and numb at times with the occasional sharp pains. The severity of the pain makes him alter his gait at times. He has increased levels of edema especially towards the end of the day. Modifying factors include elevation on a pillow and soaking his leg in Epson salt which has not helped very much.  Described as itchy and when he gets a needle for blood work. He has decreased smoking and decreased his soda intake. Denies fevers, chills, or chest pain. He had one episode of his foot briefly going numb yesterday which has improved. States he has had back pain going on since April with no significant trauma that he is aware of. Back pain is described as dull and achy. Denies saddle anesthesia or changes to bowel habits.   Of note he was seen earlier today in the ED with shortness of breath. Work up reviewed with EKG showing sinus rhythm. Blood work with no significant abnormalities.    No Known Allergies    Outpatient Medications Prior to Visit  Medication Sig Dispense Refill  . buPROPion (WELLBUTRIN XL) 150 MG 24 hr tablet Take 150 mg by mouth daily.    . clonazePAM (KLONOPIN) 0.5 MG tablet Take 1 tablet (0.5 mg total) by mouth 2 (two) times daily as needed (Anxiety). 10 tablet 0  . DESCOVY 200-25 MG tablet TAKE 1 TABLET BY MOUTH DAILY 30 tablet 4  . lamoTRIgine (LAMICTAL) 100 MG tablet Take 1 tablet (100 mg total) by mouth daily. For mood control 30 tablet 0  . TIVICAY 50 MG tablet TAKE 1 TABLET BY MOUTH DAILY 30 tablet 4  . albuterol (PROVENTIL  HFA;VENTOLIN HFA) 108 (90 Base) MCG/ACT inhaler Inhale 1-2 puffs into the lungs every 6 (six) hours as needed for wheezing or shortness of breath.    Marland Kitchen. albuterol (VENTOLIN HFA) 108 (90 Base) MCG/ACT inhaler Inhale into the lungs.    Marland Kitchen. amLODipine (NORVASC) 2.5 MG tablet Take 2.5 mg by mouth daily.    Marland Kitchen. guaiFENesin (MUCINEX) 600 MG 12 hr tablet Take 600 mg by mouth.    . Multiple Vitamin (MULTIVITAMIN WITH MINERALS) TABS tablet Take 1 tablet by mouth daily.    . risperiDONE (RISPERDAL) 3 MG tablet Take by mouth.    . DULoxetine (CYMBALTA) 30 MG capsule Take 1 capsule (30 mg total) by mouth daily. For mood control (Patient not taking: Reported on 11/19/2017) 30 capsule 0  . tiZANidine (ZANAFLEX) 4 MG tablet Take 4 mg by mouth.     No facility-administered medications prior to visit.      Past Medical History:  Diagnosis Date  . Depression   . Episodic mood disorder (HCC)   . Generalized anxiety disorder   . HIV (human immunodeficiency virus infection) (HCC)   . HTN (hypertension)   . Panic attack   . Paranoid schizophrenia (HCC)   . Personality disorder (HCC)   . Schizoaffective disorder, bipolar type (HCC)  Past Surgical History:  Procedure Laterality Date  . SKIN GRAFT Right    foot/leg       Review of Systems  Constitutional: Negative for chills and fever.  Respiratory: Negative for chest tightness, shortness of breath, wheezing and stridor.   Cardiovascular: Positive for leg swelling. Negative for chest pain and palpitations.  Musculoskeletal: Positive for back pain.  Neurological: Positive for weakness.      Objective:    BP (!) 144/96   Pulse 78   Temp 97.9 F (36.6 C) (Oral)   Resp (!) 22   Ht 6' (1.829 m)   Wt (!) 318 lb (144.2 kg)   SpO2 97% Comment: room air  BMI 43.13 kg/m  Nursing note and vital signs reviewed.  Physical Exam  Constitutional: He is oriented to person, place, and time. He appears well-developed and well-nourished. No distress.    Obese male seated in the chair in no distress and talking in complete sentences. He appears slightly anxious.   Cardiovascular: Normal rate, regular rhythm, normal heart sounds and intact distal pulses. Exam reveals no gallop and no friction rub.  No murmur heard. Right lower leg appears slightly edematous compared to the left. There are several areas of varicose veins/tortured veins noted on the medial and lateral proximal and distal leg. Pulses are present and appropriate.  Pulmonary/Chest: Effort normal and breath sounds normal. No stridor. No respiratory distress. He has no wheezes. He has no rales. He exhibits no tenderness.  Musculoskeletal:  Low back with no obvious deformity, discoloration, or edema. There is increased lordosis secondary to obesity. Tenderness elicited over soft tissue musculature with no palpable muscle spasms. No crepitus or deformity. Range of motion within normal limits. Distal pulses and sensation are intact and appropriate.  Neurological: He is alert and oriented to person, place, and time.  Skin: Skin is warm and dry.  Psychiatric: He has a normal mood and affect. His behavior is normal. Judgment and thought content normal.             Assessment & Plan:   Problem List Items Addressed This Visit      Cardiovascular and Mediastinum   Varicose veins of right lower extremity with pain - Primary    Symptoms and exam appear consistent with varicose vein with mild concern for potential DVT given increase in edema. This may also be likely to vein incompetence. Will order a lower extremity venous doppler. Likely would improve with compression, however with the remaining concern for a potential DVT, albeit unlikely as he does not have many risk factors, will hold on compression pending results. Ice and OTC medications as needed for symptom relief. Encouraged to elevate legs above heart when seated. If symptoms worsen or he develops chest pain or shortness of breath  recommend returning to the ED. A referral to vein specialists have been placed for follow up.       Relevant Orders   Ambulatory referral to Vascular Surgery   LE VENOUS     Other   Chronic bilateral low back pain    Back pain appears chronic in nature and most likely mechanical origin with muscle imbalance. This may also be resulting in some pain that he is having down his right leg. There is no concern for Cauda Equina. Recommend conservative treatment with ice/moist heat and home exercise therapy provided in AVS. If symptoms worsen or do not improve will recommend imaging or possible physical therapy.  I have discontinued Fayrene Fearing Selway's DULoxetine and tiZANidine. I am also having him maintain his amLODipine, albuterol, multivitamin with minerals, clonazePAM, lamoTRIgine, albuterol, guaiFENesin, risperiDONE, TIVICAY, DESCOVY, and buPROPion.   Follow-up: Return if symptoms worsen or fail to improve.   Marcos Eke, MSN, [facility]

## 2017-11-19 NOTE — ED Notes (Signed)
Patient left patient primary doctor called him so, he went in to his office

## 2017-11-19 NOTE — Patient Instructions (Signed)
Nice to see you.  Recommend taking Ibuprofen to help with inflammation  Elevate your legs.  They will call to schedule the ultrasound of your legs.   If your symptoms worsen or you develop chest pain go to the ED.  For your back:  Ice/moist heat x 20 minutes every 2 hours or as needed.   Low Back Strain Rehab Ask your health care provider which exercises are safe for you. Do exercises exactly as told by your health care provider and adjust them as directed. It is normal to feel mild stretching, pulling, tightness, or discomfort as you do these exercises, but you should stop right away if you feel sudden pain or your pain gets worse. Do not begin these exercises until told by your health care provider. Stretching and range of motion exercises These exercises warm up your muscles and joints and improve the movement and flexibility of your back. These exercises also help to relieve pain, numbness, and tingling. Exercise A: Single knee to chest  1. Lie on your back on a firm surface with both legs straight. 2. Bend one of your knees. Use your hands to move your knee up toward your chest until you feel a gentle stretch in your lower back and buttock. ? Hold your leg in this position by holding onto the front of your knee. ? Keep your other leg as straight as possible. 3. Hold for __________ seconds. 4. Slowly return to the starting position. 5. Repeat with your other leg. Repeat __________ times. Complete this exercise __________ times a day. Exercise B: Prone extension on elbows  1. Lie on your abdomen on a firm surface. 2. Prop yourself up on your elbows. 3. Use your arms to help lift your chest up until you feel a gentle stretch in your abdomen and your lower back. ? This will place some of your body weight on your elbows. If this is uncomfortable, try stacking pillows under your chest. ? Your hips should stay down, against the surface that you are lying on. Keep your hip and back  muscles relaxed. 4. Hold for __________ seconds. 5. Slowly relax your upper body and return to the starting position. Repeat __________ times. Complete this exercise __________ times a day. Strengthening exercises These exercises build strength and endurance in your back. Endurance is the ability to use your muscles for a long time, even after they get tired. Exercise C: Pelvic tilt 1. Lie on your back on a firm surface. Bend your knees and keep your feet flat. 2. Tense your abdominal muscles. Tip your pelvis up toward the ceiling and flatten your lower back into the floor. ? To help with this exercise, you may place a small towel under your lower back and try to push your back into the towel. 3. Hold for __________ seconds. 4. Let your muscles relax completely before you repeat this exercise. Repeat __________ times. Complete this exercise __________ times a day. Exercise D: Alternating arm and leg raises  1. Get on your hands and knees on a firm surface. If you are on a hard floor, you may want to use padding to cushion your knees, such as an exercise mat. 2. Line up your arms and legs. Your hands should be below your shoulders, and your knees should be below your hips. 3. Lift your left leg behind you. At the same time, raise your right arm and straighten it in front of you. ? Do not lift your leg higher than your hip. ?  Do not lift your arm higher than your shoulder. ? Keep your abdominal and back muscles tight. ? Keep your hips facing the ground. ? Do not arch your back. ? Keep your balance carefully, and do not hold your breath. 4. Hold for __________ seconds. 5. Slowly return to the starting position and repeat with your right leg and your left arm. Repeat __________ times. Complete this exercise __________times a day. Exercise J: Single leg lower with bent knees 1. Lie on your back on a firm surface. 2. Tense your abdominal muscles and lift your feet off the floor, one foot at a  time, so your knees and hips are bent in an "L" shape (at about 90 degrees). ? Your knees should be over your hips and your lower legs should be parallel to the floor. 3. Keeping your abdominal muscles tense and your knee bent, slowly lower one of your legs so your toe touches the ground. 4. Lift your leg back up to return to the starting position. ? Do not hold your breath. ? Do not let your back arch. Keep your back flat against the ground. 5. Repeat with your other leg. Repeat __________ times. Complete this exercise __________ times a day. Posture and body mechanics  Body mechanics refers to the movements and positions of your body while you do your daily activities. Posture is part of body mechanics. Good posture and healthy body mechanics can help to relieve stress in your body's tissues and joints. Good posture means that your spine is in its natural S-curve position (your spine is neutral), your shoulders are pulled back slightly, and your head is not tipped forward. The following are general guidelines for applying improved posture and body mechanics to your everyday activities. Standing   When standing, keep your spine neutral and your feet about hip-width apart. Keep a slight bend in your knees. Your ears, shoulders, and hips should line up.  When you do a task in which you stand in one place for a long time, place one foot up on a stable object that is 2-4 inches (5-10 cm) high, such as a footstool. This helps keep your spine neutral. Sitting   When sitting, keep your spine neutral and keep your feet flat on the floor. Use a footrest, if necessary, and keep your thighs parallel to the floor. Avoid rounding your shoulders, and avoid tilting your head forward.  When working at a desk or a computer, keep your desk at a height where your hands are slightly lower than your elbows. Slide your chair under your desk so you are close enough to maintain good posture.  When working at a  computer, place your monitor at a height where you are looking straight ahead and you do not have to tilt your head forward or downward to look at the screen. Resting   When lying down and resting, avoid positions that are most painful for you.  If you have pain with activities such as sitting, bending, stooping, or squatting (flexion-based activities), lie in a position in which your body does not bend very much. For example, avoid curling up on your side with your arms and knees near your chest (fetal position).  If you have pain with activities such as standing for a long time or reaching with your arms (extension-based activities), lie with your spine in a neutral position and bend your knees slightly. Try the following positions: ? Lying on your side with a pillow between your knees. ?  Lying on your back with a pillow under your knees. Lifting   When lifting objects, keep your feet at least shoulder-width apart and tighten your abdominal muscles.  Bend your knees and hips and keep your spine neutral. It is important to lift using the strength of your legs, not your back. Do not lock your knees straight out.  Always ask for help to lift heavy or awkward objects. This information is not intended to replace advice given to you by your health care provider. Make sure you discuss any questions you have with your health care provider. Document Released: 04/30/2005 Document Revised: 01/05/2016 Document Reviewed: 02/09/2015 Elsevier Interactive Patient Education  [employer].

## 2017-11-19 NOTE — Assessment & Plan Note (Signed)
Symptoms and exam appear consistent with varicose vein with mild concern for potential DVT given increase in edema. This may also be likely to vein incompetence. Will order a lower extremity venous doppler. Likely would improve with compression, however with the remaining concern for a potential DVT, albeit unlikely as he does not have many risk factors, will hold on compression pending results. Ice and OTC medications as needed for symptom relief. Encouraged to elevate legs above heart when seated. If symptoms worsen or he develops chest pain or shortness of breath recommend returning to the ED. A referral to vein specialists have been placed for follow up.

## 2017-11-19 NOTE — ED Notes (Signed)
Pt called for x3 times. Pt is not present in the waiting room.

## 2017-11-19 NOTE — ED Triage Notes (Signed)
Pt. Stated, My legs are swollen and feel itchy. Im also SOB because Im out of my inhaler and need refill

## 2017-11-19 NOTE — Assessment & Plan Note (Signed)
Back pain appears chronic in nature and most likely mechanical origin with muscle imbalance. This may also be resulting in some pain that he is having down his right leg. There is no concern for Cauda Equina. Recommend conservative treatment with ice/moist heat and home exercise therapy provided in AVS. If symptoms worsen or do not improve will recommend imaging or possible physical therapy.

## 2017-11-20 ENCOUNTER — Other Ambulatory Visit: Payer: Self-pay

## 2017-11-20 DIAGNOSIS — I83811 Varicose veins of right lower extremities with pain: Secondary | ICD-10-CM

## 2017-11-20 LAB — CBC WITH DIFFERENTIAL/PLATELET
BASOS ABS: 18 {cells}/uL (ref 0–200)
Basophils Relative: 0.2 %
EOS ABS: 126 {cells}/uL (ref 15–500)
Eosinophils Relative: 1.4 %
HCT: 43.7 % (ref 38.5–50.0)
Hemoglobin: 15.1 g/dL (ref 13.2–17.1)
Lymphs Abs: 4086 cells/uL — ABNORMAL HIGH (ref 850–3900)
MCH: 31.7 pg (ref 27.0–33.0)
MCHC: 34.6 g/dL (ref 32.0–36.0)
MCV: 91.8 fL (ref 80.0–100.0)
MONOS PCT: 7.7 %
MPV: 9.7 fL (ref 7.5–12.5)
Neutro Abs: 4077 cells/uL (ref 1500–7800)
Neutrophils Relative %: 45.3 %
PLATELETS: 250 10*3/uL (ref 140–400)
RBC: 4.76 10*6/uL (ref 4.20–5.80)
RDW: 13.4 % (ref 11.0–15.0)
TOTAL LYMPHOCYTE: 45.4 %
WBC mixed population: 693 cells/uL (ref 200–950)
WBC: 9 10*3/uL (ref 3.8–10.8)

## 2017-11-20 LAB — COMPLETE METABOLIC PANEL WITH GFR
AG RATIO: 1.9 (calc) (ref 1.0–2.5)
ALBUMIN MSPROF: 4.5 g/dL (ref 3.6–5.1)
ALKALINE PHOSPHATASE (APISO): 110 U/L (ref 40–115)
ALT: 28 U/L (ref 9–46)
AST: 18 U/L (ref 10–40)
BILIRUBIN TOTAL: 0.3 mg/dL (ref 0.2–1.2)
BUN: 12 mg/dL (ref 7–25)
CHLORIDE: 104 mmol/L (ref 98–110)
CO2: 29 mmol/L (ref 20–32)
Calcium: 9.5 mg/dL (ref 8.6–10.3)
Creat: 1.22 mg/dL (ref 0.60–1.35)
GFR, EST AFRICAN AMERICAN: 86 mL/min/{1.73_m2} (ref >=60)
GFR, Est Non African American: 74 mL/min/{1.73_m2} (ref >=60)
Globulin: 2.4 g/dL (calc) (ref 1.9–3.7)
Glucose, Bld: 105 mg/dL — ABNORMAL HIGH (ref 65–99)
POTASSIUM: 4.1 mmol/L (ref 3.5–5.3)
Sodium: 140 mmol/L (ref 135–146)
TOTAL PROTEIN: 6.9 g/dL (ref 6.1–8.1)

## 2017-11-20 LAB — T-HELPER CELL (CD4) - (RCID CLINIC ONLY)
CD4 T CELL HELPER: 33 % (ref 33–55)
CD4 T Cell Abs: 1410 /uL (ref 400–2700)

## 2017-11-20 LAB — RPR: RPR: NONREACTIVE

## 2017-11-21 LAB — HIV-1 RNA QUANT-NO REFLEX-BLD
HIV 1 RNA QUANT: DETECTED {copies}/mL — AB
HIV-1 RNA QUANT, LOG: DETECTED {Log_copies}/mL — AB

## 2017-11-22 ENCOUNTER — Ambulatory Visit (HOSPITAL_COMMUNITY): Payer: Medicare HMO | Attending: Family

## 2017-12-03 ENCOUNTER — Encounter: Payer: Self-pay | Admitting: Internal Medicine

## 2017-12-19 ENCOUNTER — Encounter: Payer: Self-pay | Admitting: Internal Medicine

## 2018-02-03 ENCOUNTER — Other Ambulatory Visit: Payer: Self-pay | Admitting: Internal Medicine

## 2018-02-14 ENCOUNTER — Inpatient Hospital Stay (HOSPITAL_COMMUNITY): Admission: RE | Admit: 2018-02-14 | Payer: Self-pay | Source: Ambulatory Visit

## 2018-02-14 ENCOUNTER — Encounter: Payer: Medicare HMO | Admitting: Vascular Surgery

## 2018-02-25 ENCOUNTER — Encounter: Payer: Self-pay | Admitting: Vascular Surgery

## 2018-04-07 ENCOUNTER — Other Ambulatory Visit: Payer: Medicare HMO

## 2018-04-07 ENCOUNTER — Other Ambulatory Visit: Payer: Self-pay | Admitting: *Deleted

## 2018-04-07 DIAGNOSIS — B20 Human immunodeficiency virus [HIV] disease: Secondary | ICD-10-CM

## 2018-04-08 LAB — T-HELPER CELL (CD4) - (RCID CLINIC ONLY)
CD4 % Helper T Cell: 39 % (ref 33–55)
CD4 T Cell Abs: 1440 /uL (ref 400–2700)

## 2018-04-12 LAB — COMPLETE METABOLIC PANEL WITH GFR
AG RATIO: 2.2 (calc) (ref 1.0–2.5)
ALBUMIN MSPROF: 4.3 g/dL (ref 3.6–5.1)
ALT: 18 U/L (ref 9–46)
AST: 10 U/L (ref 10–40)
Alkaline phosphatase (APISO): 112 U/L (ref 40–115)
BUN: 9 mg/dL (ref 7–25)
CALCIUM: 9.2 mg/dL (ref 8.6–10.3)
CHLORIDE: 105 mmol/L (ref 98–110)
CO2: 29 mmol/L (ref 20–32)
Creat: 1.15 mg/dL (ref 0.60–1.35)
GFR, EST AFRICAN AMERICAN: 92 mL/min/{1.73_m2} (ref >=60)
GFR, EST NON AFRICAN AMERICAN: 79 mL/min/{1.73_m2} (ref >=60)
Globulin: 2 g/dL (calc) (ref 1.9–3.7)
Glucose, Bld: 104 mg/dL — ABNORMAL HIGH (ref 65–99)
Potassium: 4.1 mmol/L (ref 3.5–5.3)
Sodium: 140 mmol/L (ref 135–146)
TOTAL PROTEIN: 6.3 g/dL (ref 6.1–8.1)
Total Bilirubin: 0.3 mg/dL (ref 0.2–1.2)

## 2018-04-12 LAB — HIV-1 RNA QUANT-NO REFLEX-BLD
HIV 1 RNA QUANT: NOT DETECTED {copies}/mL
HIV-1 RNA Quant, Log: <1.3 Log copies/mL

## 2018-04-12 LAB — CBC WITH DIFFERENTIAL/PLATELET
BASOS ABS: 16 {cells}/uL (ref 0–200)
Basophils Relative: 0.2 %
Eosinophils Absolute: 213 cells/uL (ref 15–500)
Eosinophils Relative: 2.6 %
HEMATOCRIT: 42.4 % (ref 38.5–50.0)
Hemoglobin: 14.7 g/dL (ref 13.2–17.1)
Lymphs Abs: 3747 cells/uL (ref 850–3900)
MCH: 32 pg (ref 27.0–33.0)
MCHC: 34.7 g/dL (ref 32.0–36.0)
MCV: 92.2 fL (ref 80.0–100.0)
MPV: 9.6 fL (ref 7.5–12.5)
Monocytes Relative: 7.8 %
NEUTROS PCT: 43.7 %
Neutro Abs: 3583 cells/uL (ref 1500–7800)
PLATELETS: 251 10*3/uL (ref 140–400)
RBC: 4.6 10*6/uL (ref 4.20–5.80)
RDW: 12.9 % (ref 11.0–15.0)
TOTAL LYMPHOCYTE: 45.7 %
WBC: 8.2 10*3/uL (ref 3.8–10.8)
WBCMIX: 640 {cells}/uL (ref 200–950)

## 2018-04-26 ENCOUNTER — Encounter (HOSPITAL_COMMUNITY): Payer: Self-pay

## 2018-04-26 ENCOUNTER — Emergency Department (HOSPITAL_COMMUNITY): Payer: Medicare HMO

## 2018-04-26 ENCOUNTER — Emergency Department (HOSPITAL_COMMUNITY)
Admission: EM | Admit: 2018-04-26 | Discharge: 2018-04-26 | Disposition: A | Discharge status: Home / Self Care | Payer: Medicare HMO | Attending: Emergency Medicine | Admitting: Emergency Medicine

## 2018-04-26 ENCOUNTER — Emergency Department (HOSPITAL_BASED_OUTPATIENT_CLINIC_OR_DEPARTMENT_OTHER): Payer: Medicare HMO

## 2018-04-26 DIAGNOSIS — Z79899 Other long term (current) drug therapy: Secondary | ICD-10-CM | POA: Insufficient documentation

## 2018-04-26 DIAGNOSIS — F17210 Nicotine dependence, cigarettes, uncomplicated: Secondary | ICD-10-CM | POA: Diagnosis not present

## 2018-04-26 DIAGNOSIS — M79604 Pain in right leg: Secondary | ICD-10-CM

## 2018-04-26 DIAGNOSIS — R52 Pain, unspecified: Secondary | ICD-10-CM

## 2018-04-26 DIAGNOSIS — R079 Chest pain, unspecified: Secondary | ICD-10-CM | POA: Diagnosis not present

## 2018-04-26 DIAGNOSIS — I10 Essential (primary) hypertension: Secondary | ICD-10-CM | POA: Diagnosis not present

## 2018-04-26 LAB — CBC WITH DIFFERENTIAL/PLATELET
Abs Immature Granulocytes: 0.01 10*3/uL (ref 0.00–0.07)
BASOS PCT: 0 %
Basophils Absolute: 0 10*3/uL (ref 0.0–0.1)
EOS ABS: 0.2 10*3/uL (ref 0.0–0.5)
Eosinophils Relative: 3 %
HCT: 44.4 % (ref 39.0–52.0)
Hemoglobin: 14.3 g/dL (ref 13.0–17.0)
IMMATURE GRANULOCYTES: 0 %
Lymphocytes Relative: 44 %
Lymphs Abs: 3.3 10*3/uL (ref 0.7–4.0)
MCH: 31 pg (ref 26.0–34.0)
MCHC: 32.2 g/dL (ref 30.0–36.0)
MCV: 96.1 fL (ref 80.0–100.0)
MONOS PCT: 9 %
Monocytes Absolute: 0.7 10*3/uL (ref 0.1–1.0)
NEUTROS PCT: 44 %
Neutro Abs: 3.4 10*3/uL (ref 1.7–7.7)
PLATELETS: 241 10*3/uL (ref 150–400)
RBC: 4.62 MIL/uL (ref 4.22–5.81)
RDW: 13.3 % (ref 11.5–15.5)
WBC: 7.6 10*3/uL (ref 4.0–10.5)
nRBC: 0 % (ref 0.0–0.2)

## 2018-04-26 LAB — BASIC METABOLIC PANEL
ANION GAP: 8 (ref 5–15)
BUN: 12 mg/dL (ref 6–20)
CALCIUM: 8.9 mg/dL (ref 8.9–10.3)
CO2: 25 mmol/L (ref 22–32)
Chloride: 107 mmol/L (ref 98–111)
Creatinine, Ser: 1.18 mg/dL (ref 0.61–1.24)
GFR calc Af Amer: >60 mL/min (ref >60)
GLUCOSE: 107 mg/dL — AB (ref 70–99)
Potassium: 3.7 mmol/L (ref 3.5–5.1)
SODIUM: 140 mmol/L (ref 135–145)

## 2018-04-26 LAB — I-STAT TROPONIN, ED: Troponin i, poc: 0.01 ng/mL (ref 0.00–0.08)

## 2018-04-26 LAB — CK: Total CK: 378 U/L (ref 49–397)

## 2018-04-26 IMAGING — DX DG CHEST 2V
2 series · 2 of 2 positions shown · non-contrast
Comparison: Radiographs [DATE].

CLINICAL DATA: Chest pain.

EXAM:
CHEST - 2 VIEW

[chest lat]
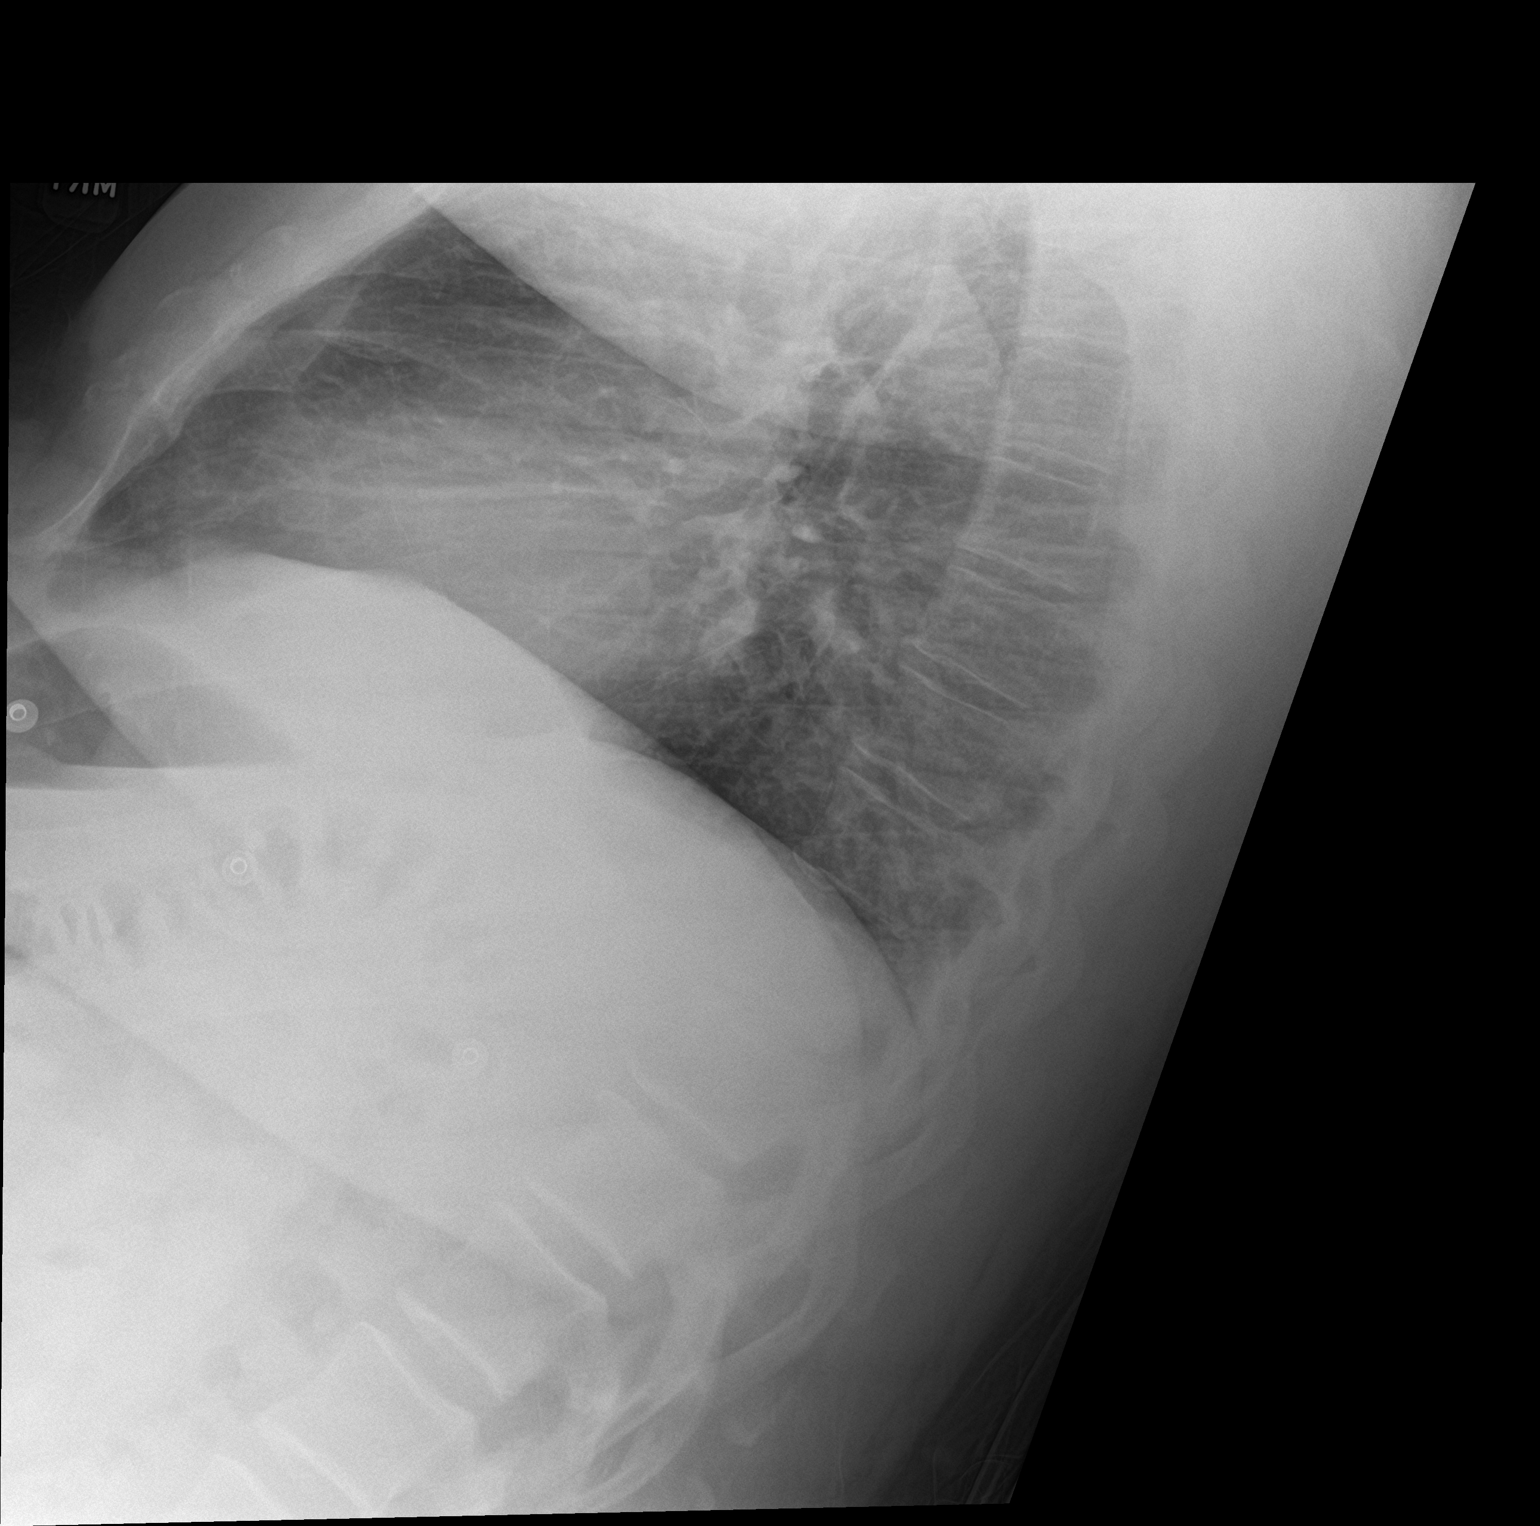

[chest ap]
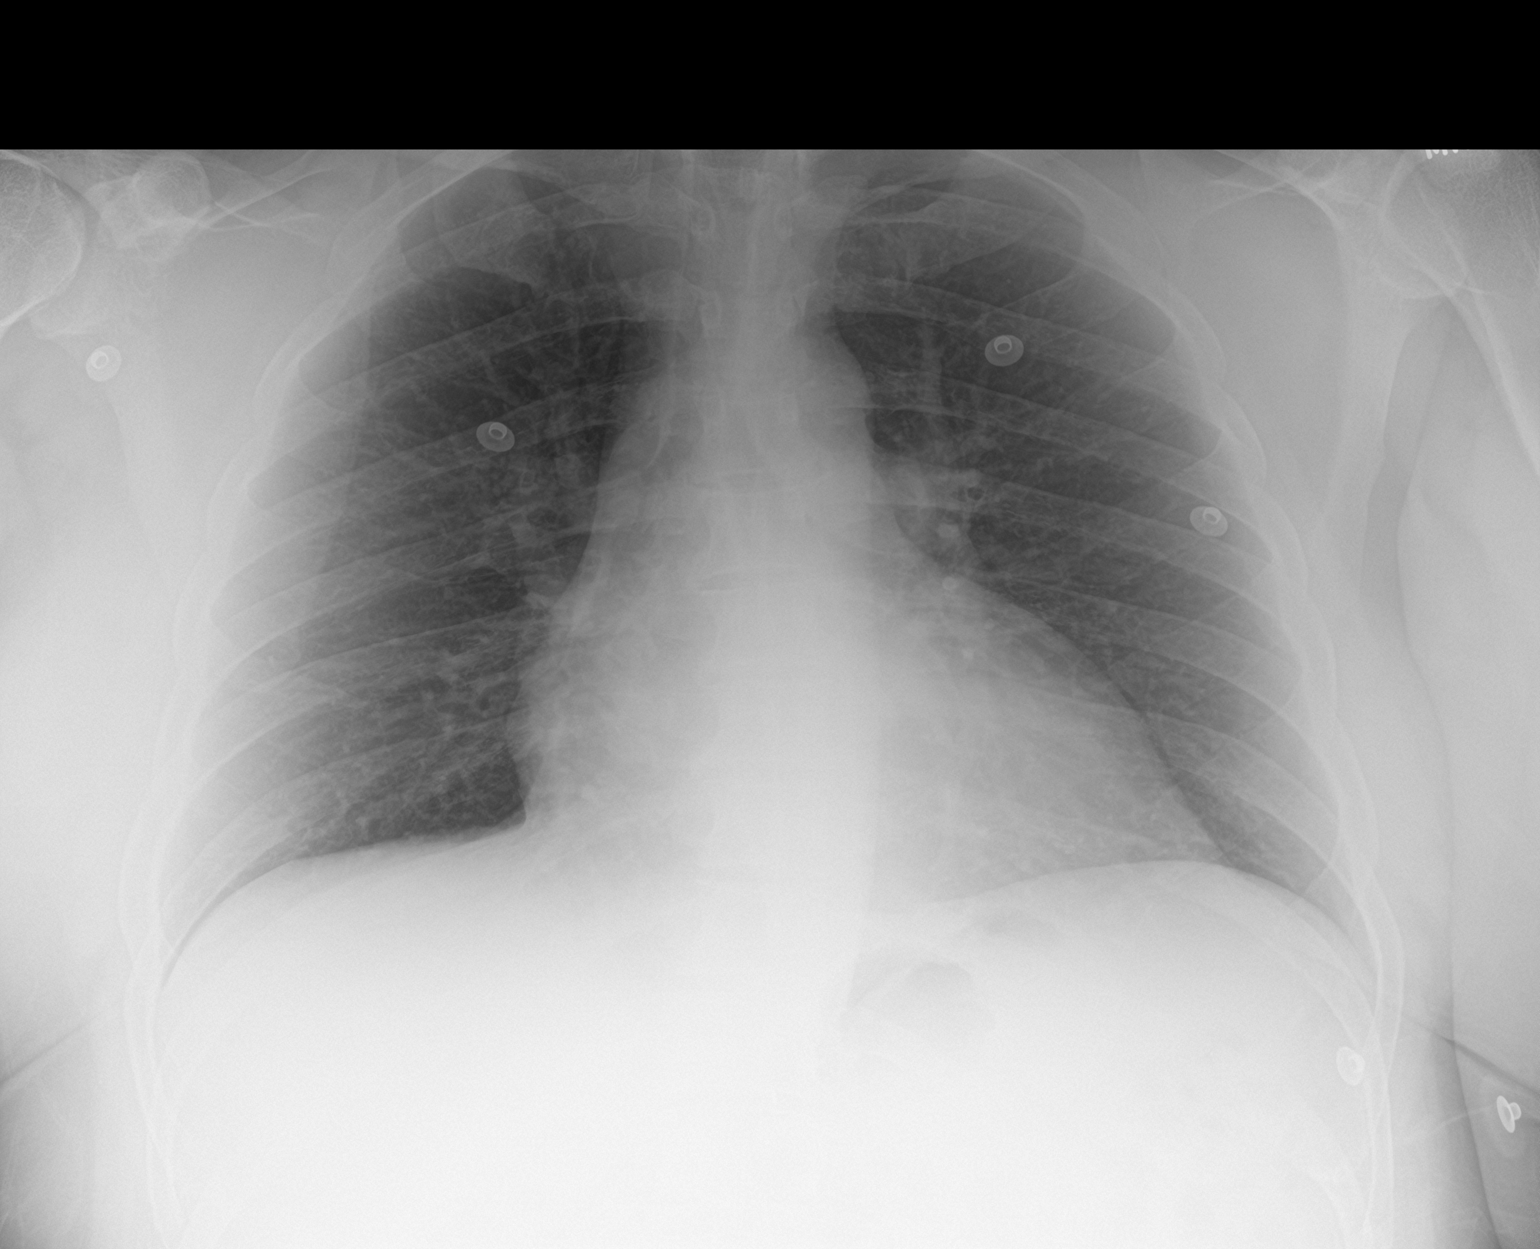

[2 of 2 positions shown; findings below may reference images not displayed]

FINDINGS: The heart size and mediastinal contours are within normal limits.
Both lungs are clear. No pneumothorax or pleural effusion is noted.
The visualized skeletal structures are unremarkable.
IMPRESSION: No active cardiopulmonary disease.

## 2018-04-26 IMAGING — CT CT ANGIO CHEST
2 of 6 series · 19 of 36 positions shown · IV contrast (iopamidol)
Comparison: Chest x-ray earlier today

CLINICAL DATA: Right leg weakness. PE suspected, high test
probability.

EXAM:
CT ANGIOGRAPHY CHEST WITH CONTRAST
TECHNIQUE: Multidetector CT imaging of the chest was performed using the
standard protocol during bolus administration of intravenous
contrast. Multiplanar CT image reconstructions and MIPs were
obtained to evaluate the vascular anatomy.
CONTRAST:  100mL [61] IOPAMIDOL ([61]) INJECTION 76%

[Series 7: pe thins · axial · 0.75mm/px · z∈[+1299,+1539]mm · 18 of 381 slices shown]
[im 20/381  lung]
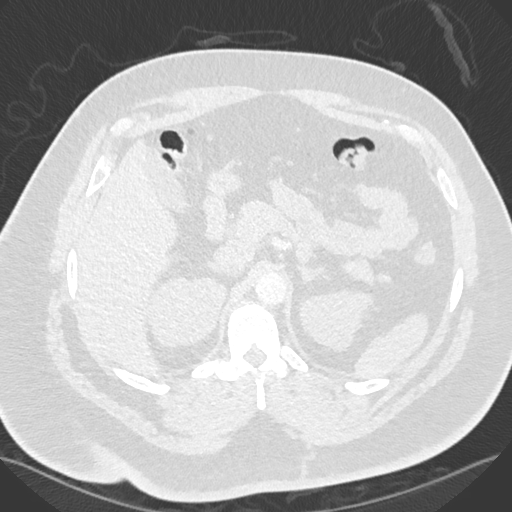
[im 39/381  mediastinal]
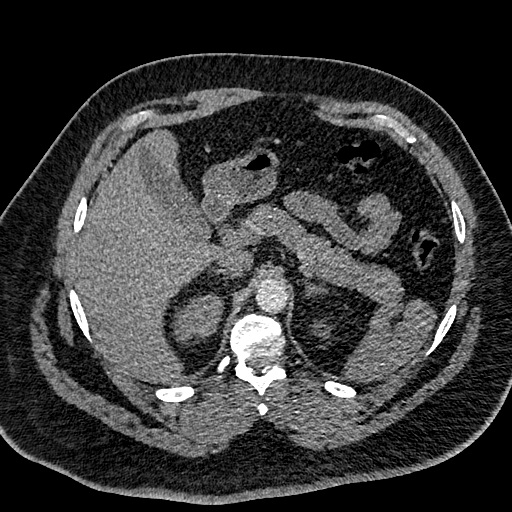
[im 58/381  lung]
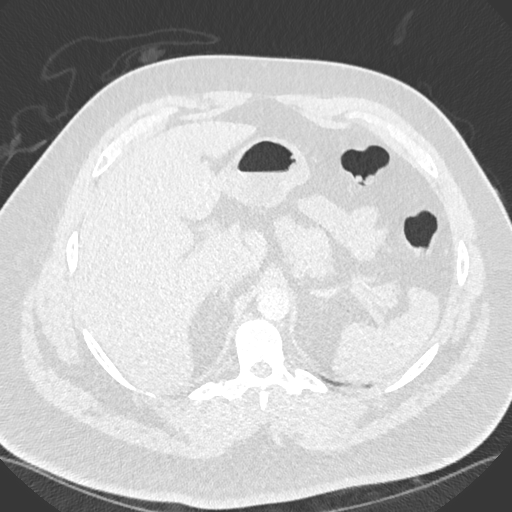
[im 77/381  mediastinal]
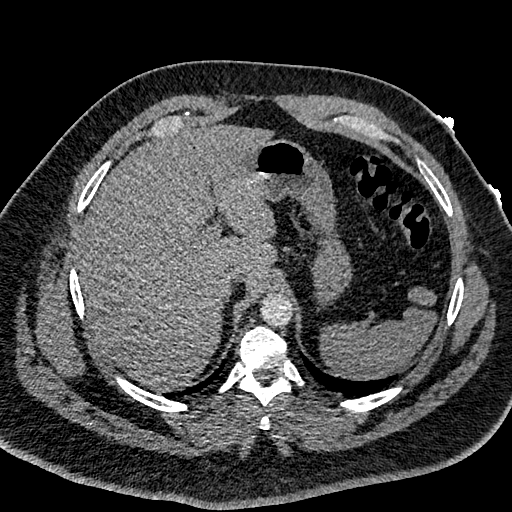
[im 96/381  lung]
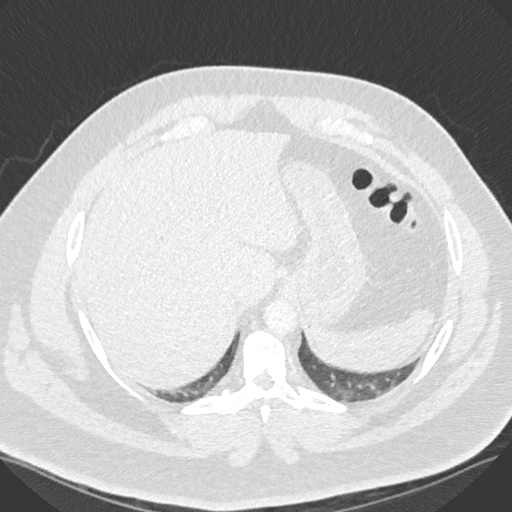
[im 115/381  mediastinal]
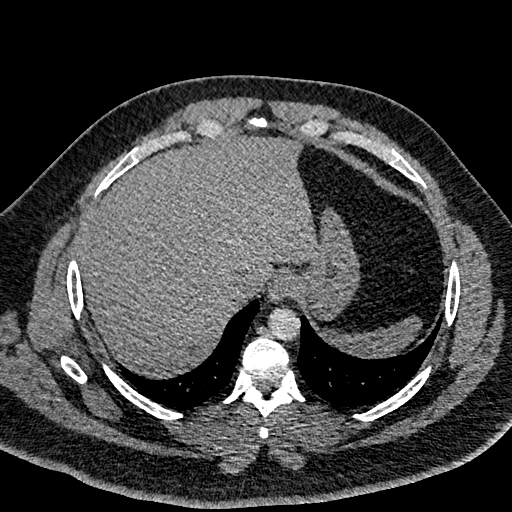
[im 134/381  lung]
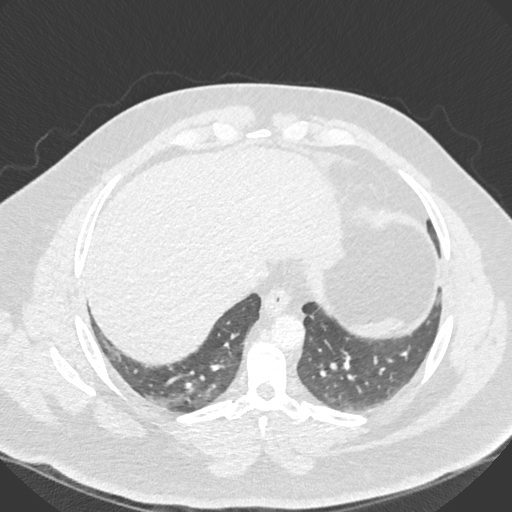
[im 153/381  mediastinal]
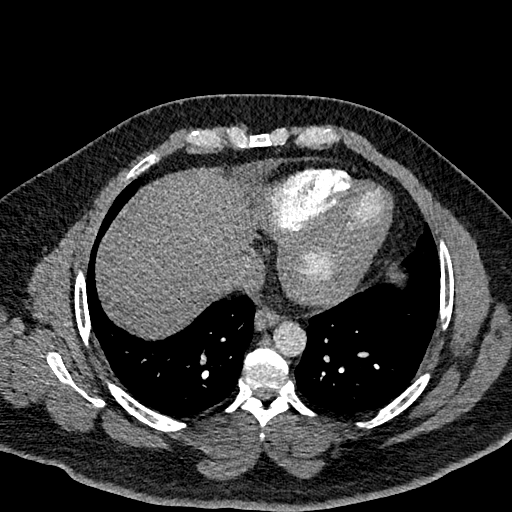
[im 172/381  lung]
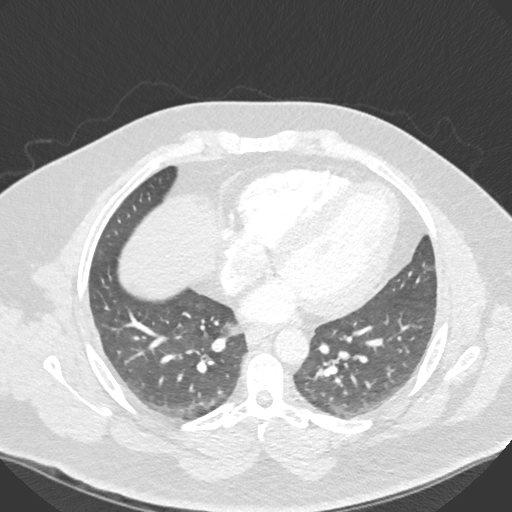
[im 210/381  mediastinal]
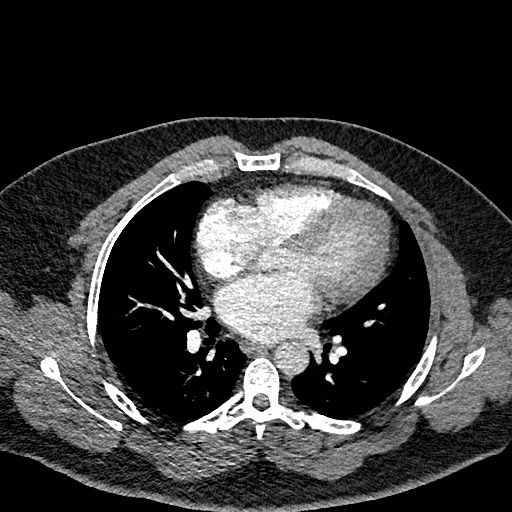
[im 229/381  lung]
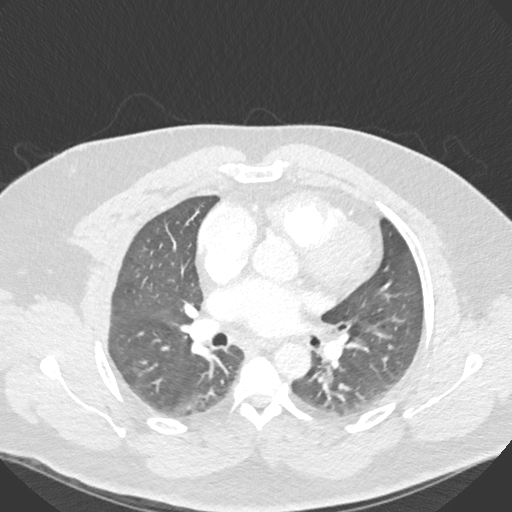
[im 248/381  mediastinal]
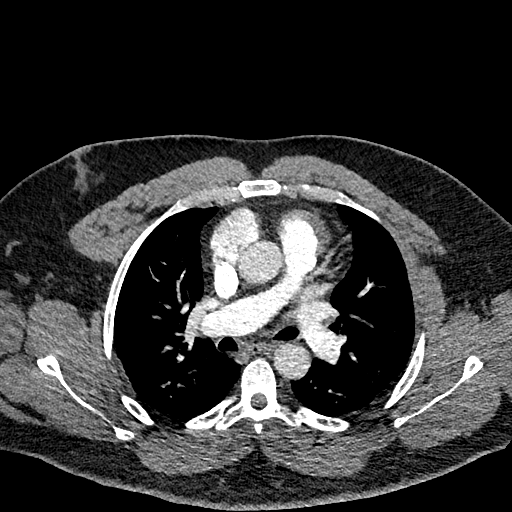
[im 267/381  lung]
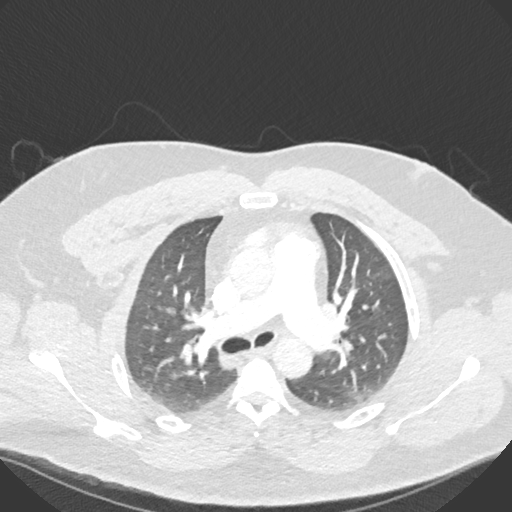
[im 286/381  mediastinal]
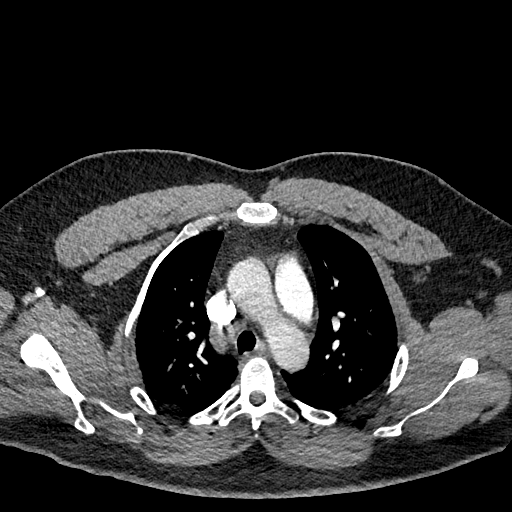
[im 305/381  lung]
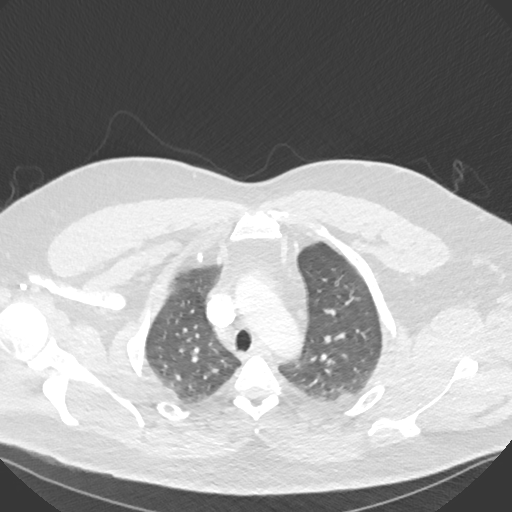
[im 324/381  mediastinal]
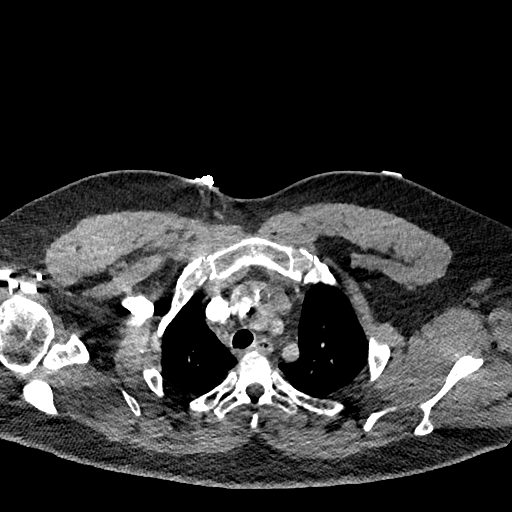
[im 343/381  lung]
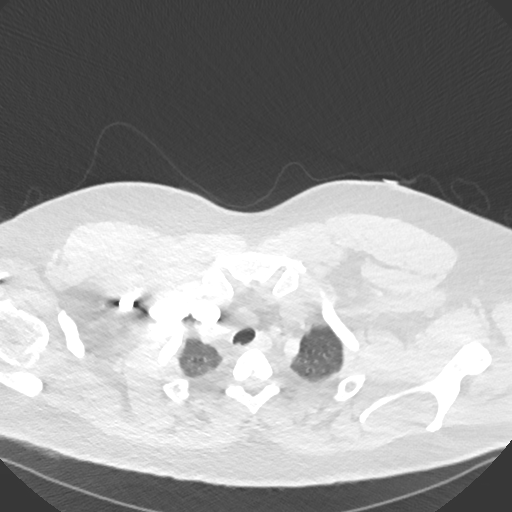
[im 362/381  mediastinal]
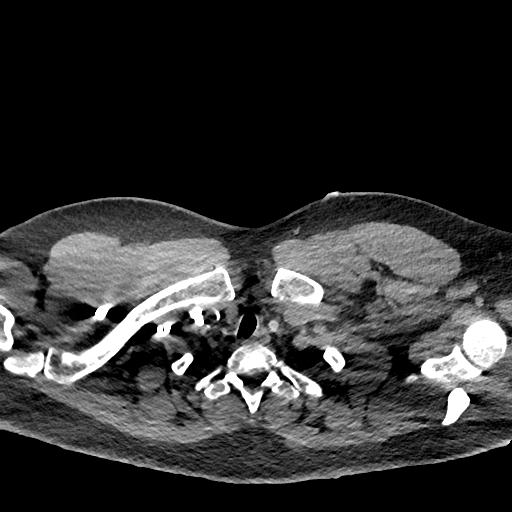

[Series 8: pe 2mm cor · coronal · 0.54mm/px · 1 of 151 slices shown]
[im 76/151  mediastinal]
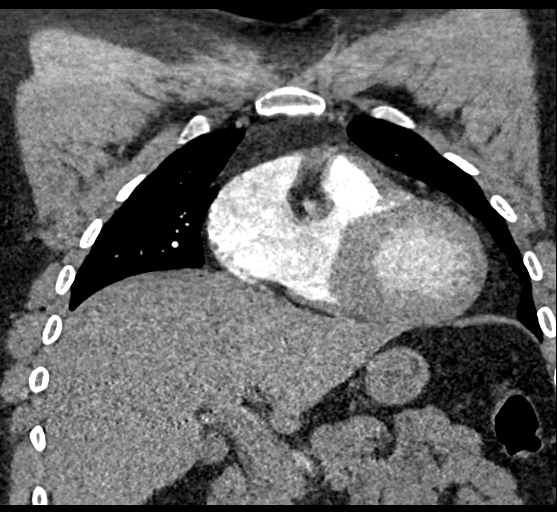

[19 of 36 positions shown; findings below may reference images not displayed]

FINDINGS: Cardiovascular: No filling defects in the pulmonary arteries to
suggestpulmonary emboli. Heart is mildly enlarged. Aorta is normal
caliber.

Mediastinum/Nodes: No mediastinal, hilar, or axillary adenopathy.

Lungs/Pleura: Lungs are clear. No focal airspace opacities or
suspicious nodules. No effusions.

Upper Abdomen: Imaging into the upper abdomen shows no acute
findings.

Musculoskeletal: Chest wall soft tissues are unremarkable. No acute
bony abnormality.

Review of the MIP images confirms the above findings.
IMPRESSION: No evidence of pulmonary embolus.

Mild cardiomegaly.

No active cardiopulmonary disease.

## 2018-04-26 MED ORDER — IOPAMIDOL (ISOVUE-370) INJECTION 76%
100.0000 mL | Freq: Once | INTRAVENOUS | Status: AC | PRN
  Administered 2018-04-26: 100 mL via INTRAVENOUS

## 2018-04-26 MED ORDER — IOPAMIDOL (ISOVUE-370) INJECTION 76%
INTRAVENOUS | Status: AC
  Filled 2018-04-26: qty 100

## 2018-04-26 MED ORDER — METHOCARBAMOL 500 MG PO TABS
1000.0000 mg | ORAL_TABLET | Freq: Once | ORAL | Status: AC
  Administered 2018-04-26: 1000 mg via ORAL
  Filled 2018-04-26: qty 2

## 2018-04-26 MED ORDER — MORPHINE SULFATE (PF) 4 MG/ML IV SOLN
4.0000 mg | Freq: Once | INTRAVENOUS | Status: AC
  Administered 2018-04-26: 4 mg via INTRAVENOUS
  Filled 2018-04-26: qty 1

## 2018-04-26 MED ORDER — ONDANSETRON HCL 4 MG/2ML IJ SOLN
4.0000 mg | Freq: Once | INTRAMUSCULAR | Status: AC
  Administered 2018-04-26: 4 mg via INTRAVENOUS
  Filled 2018-04-26: qty 2

## 2018-04-26 MED ORDER — ASPIRIN 325 MG PO TABS
325.0000 mg | ORAL_TABLET | Freq: Once | ORAL | Status: AC
  Administered 2018-04-26: 325 mg via ORAL
  Filled 2018-04-26: qty 1

## 2018-04-26 NOTE — ED Triage Notes (Signed)
Arrives by [facility] c/o right leg pain.  Reports "spider vein" has gotten worse since thanksgiving and severely paiinful in the last 4 hours.

## 2018-04-26 NOTE — ED Notes (Signed)
Patient transported to CT 

## 2018-04-26 NOTE — Progress Notes (Signed)
Right lower extremity venous duplex completed. Refer to "CV Proc" under chart review to view preliminary results.  04/26/2018 2:06 PM Gertie FeyMichelle Laiyah Exline, MHA, RVT, RDCS, RDMS

## 2018-04-26 NOTE — Discharge Instructions (Signed)
Please follow-up with referred vascular doctor for your leg pain.  Follow-up with your primary care doctor as needed.  Return to emergency department for any worsening pain, redness or swelling of the leg, numbness/weakness of the leg or any other worsening or concerning symptoms.

## 2018-04-26 NOTE — ED Notes (Signed)
CT says he is next, pt updated

## 2018-04-26 NOTE — ED Notes (Signed)
Pt stable, ambulatory, states understanding of discharge instructions 

## 2018-04-26 NOTE — ED Provider Notes (Signed)
Care assumed from  [facility] Ford, PA-C at shift change with CTA pending.   In brief, this patient is a 42 y.o. M with history of HIV, hypertension, generalized anxiety, schizoaffective disorder who presents for evaluation of acute right lower leg pain.  He does report he had a initial episode of chest pain and shortness of breath with the pain.  Chest pain or shortness of breath have resided.  He continues to have the right leg pain.  DVT states that he negative.  Labs unremarkable.  He is pending CTA for evaluation of PE.  PLAN: If CTA of chest is unremarkable.  Dispo home with follow-up with vascular.  MDM:  CT of chest is unremarkable.  Dr. Jacqulyn BathLong discussed results with patient.  Plan to give outpatient follow-up with vascular surgery for further evaluation.  1. Right leg pain   2. Chest pain, unspecified type        Maxwell CaulLayden, Lindsey A, PA-C 04/26/18 2254    Maia PlanLong, Joshua G, MD 04/27/18 [id number]
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
## 2018-04-28 ENCOUNTER — Encounter: Payer: Self-pay | Admitting: Internal Medicine

## 2018-05-13 ENCOUNTER — Other Ambulatory Visit: Payer: Self-pay | Admitting: Infectious Diseases

## 2018-06-16 ENCOUNTER — Other Ambulatory Visit: Payer: Self-pay | Admitting: Infectious Diseases

## 2018-07-07 ENCOUNTER — Other Ambulatory Visit: Payer: Self-pay | Admitting: *Deleted

## 2018-07-07 MED ORDER — EMTRICITABINE-TENOFOVIR AF 200-25 MG PO TABS: 1.0000 | ORAL_TABLET | Freq: Every day | ORAL | 0 refills | Status: DC

## 2018-07-16 ENCOUNTER — Other Ambulatory Visit: Payer: Medicare HMO

## 2018-07-16 ENCOUNTER — Other Ambulatory Visit: Payer: Self-pay | Admitting: *Deleted

## 2018-07-16 DIAGNOSIS — Z113 Encounter for screening for infections with a predominantly sexual mode of transmission: Secondary | ICD-10-CM

## 2018-07-16 DIAGNOSIS — Z79899 Other long term (current) drug therapy: Secondary | ICD-10-CM

## 2018-07-16 DIAGNOSIS — B20 Human immunodeficiency virus [HIV] disease: Secondary | ICD-10-CM

## 2018-07-30 ENCOUNTER — Encounter: Payer: Medicare HMO | Admitting: Internal Medicine

## 2018-08-25 ENCOUNTER — Other Ambulatory Visit: Payer: Self-pay | Admitting: Infectious Diseases

## 2018-08-28 ENCOUNTER — Other Ambulatory Visit: Payer: Self-pay | Admitting: Infectious Diseases

## 2018-08-28 ENCOUNTER — Encounter: Payer: Self-pay | Admitting: Family

## 2018-08-28 MED ORDER — EMTRICITABINE-TENOFOVIR AF 200-25 MG PO TABS: 1.0000 | ORAL_TABLET | Freq: Every day | ORAL | 0 refills | Status: DC

## 2018-09-11 ENCOUNTER — Ambulatory Visit (INDEPENDENT_AMBULATORY_CARE_PROVIDER_SITE_OTHER): Payer: Medicare HMO | Admitting: Family

## 2018-09-11 ENCOUNTER — Other Ambulatory Visit: Payer: Self-pay

## 2018-09-11 ENCOUNTER — Encounter: Payer: Self-pay | Admitting: Family

## 2018-09-11 VITALS — BP 145/90 | HR 80 | Temp 98.0°F | Wt 323.0 lb

## 2018-09-11 DIAGNOSIS — Z113 Encounter for screening for infections with a predominantly sexual mode of transmission: Secondary | ICD-10-CM

## 2018-09-11 DIAGNOSIS — Z23 Encounter for immunization: Secondary | ICD-10-CM

## 2018-09-11 DIAGNOSIS — R0683 Snoring: Secondary | ICD-10-CM | POA: Diagnosis not present

## 2018-09-11 DIAGNOSIS — Z0000 Encounter for general adult medical examination without abnormal findings: Secondary | ICD-10-CM

## 2018-09-11 DIAGNOSIS — B20 Human immunodeficiency virus [HIV] disease: Secondary | ICD-10-CM

## 2018-09-11 MED ORDER — EMTRICITABINE-TENOFOVIR AF 200-25 MG PO TABS: 1.0000 | ORAL_TABLET | Freq: Every day | ORAL | 3 refills | Status: DC

## 2018-09-11 MED ORDER — DOLUTEGRAVIR SODIUM 50 MG PO TABS: 50.0000 mg | ORAL_TABLET | Freq: Every day | ORAL | 3 refills | Status: DC

## 2018-09-11 NOTE — Progress Notes (Signed)
Subjective:    Patient ID: Yanuel Tagg, male    DOB: Jun 10, 1975, 43 y.o.   MRN: [id number]  Chief Complaint  Patient presents with  . Follow-up    B20     HPI:  Sufyaan Palma is a 43 y.o. male with HIV disease was last seen in the office on 11/19/17 for an acute office visit and for HIV on 07/24/2016 with good tolerance and adherence to Tivicay and Descovy. Most recent blood work from 04/07/18 with viral load that was undetectable and CD4 count 1,440. Healthcare maintenance due includes 2nd dose of Menveo.   Mr. Finigan continues to take his Tivicay/Descovy as prescribed with no adverse side effects or missed doses since his last office visit.  He feels well today although has been experiencing fatigue/tiredness recently.  He has also had the need to urinate in the middle of the night and most recently woke up gasping for air within the last 3 days.  He does have increased somnolence over the course of the day and has been told he snores. Denies fevers, chills, night sweats, headaches, changes in vision, neck pain/stiffness, nausea, diarrhea, vomiting, lesions or rashes.  Mr. Umland remains covered through [facility] and no problems obtaining his medication from the pharmacy.  Not currently working and remains on disability at present.  Denies feelings of being down, depressed, or hopeless recently.  No recreational or illicit drug use.  No tobacco or alcohol consumption recently.  Not currently sexually active.   No Known Allergies    Outpatient Medications Prior to Visit  Medication Sig Dispense Refill  . albuterol (PROVENTIL HFA;VENTOLIN HFA) 108 (90 Base) MCG/ACT inhaler Inhale 1-2 puffs into the lungs every 6 (six) hours as needed for wheezing or shortness of breath.    Marland Kitchen amLODipine (NORVASC) 2.5 MG tablet Take 2.5 mg by mouth daily.    Marland Kitchen buPROPion (WELLBUTRIN XL) 150 MG 24 hr tablet Take 150 mg by mouth daily.    . clonazePAM (KLONOPIN) 0.5 MG tablet Take 1 tablet (0.5 mg total)  by mouth 2 (two) times daily as needed (Anxiety). 10 tablet 0  . guaiFENesin (MUCINEX) 600 MG 12 hr tablet Take 600 mg by mouth.    . lamoTRIgine (LAMICTAL) 100 MG tablet Take 1 tablet (100 mg total) by mouth daily. For mood control 30 tablet 0  . Multiple Vitamin (MULTIVITAMIN WITH MINERALS) TABS tablet Take 1 tablet by mouth daily.    Marland Kitchen emtricitabine-tenofovir AF (DESCOVY) 200-25 MG tablet Take 1 tablet by mouth daily. 30 tablet 0  . TIVICAY 50 MG tablet TAKE 1 TABLET BY MOUTH DAILY 30 tablet 0  . albuterol (VENTOLIN HFA) 108 (90 Base) MCG/ACT inhaler Inhale into the lungs.    . risperiDONE (RISPERDAL) 3 MG tablet Take by mouth.     No facility-administered medications prior to visit.      Past Medical History:  Diagnosis Date  . Depression   . Episodic mood disorder (HCC)   . Generalized anxiety disorder   . HIV (human immunodeficiency virus infection) (HCC)   . HTN (hypertension)   . Panic attack   . Paranoid schizophrenia (HCC)   . Personality disorder (HCC)   . Schizoaffective disorder, bipolar type [facility])      Past Surgical History:  Procedure Laterality Date  . SKIN GRAFT Right    foot/leg   Review of Systems  Constitutional: Positive for fatigue. Negative for appetite change, chills, fever and unexpected weight change.  Eyes: Negative for visual disturbance.  Respiratory: Negative for cough, chest tightness, shortness of breath and wheezing.   Cardiovascular: Negative for chest pain and leg swelling.  Gastrointestinal: Negative for abdominal pain, constipation, diarrhea, nausea and vomiting.  Genitourinary: Negative for dysuria, flank pain, frequency, genital sores, hematuria and urgency.  Skin: Negative for rash.  Allergic/Immunologic: Negative for immunocompromised state.  Neurological: Negative for dizziness and headaches.      Objective:    BP (!) 145/90   Pulse 80   Temp 98 F (36.7 C) (Oral)   Wt (!) 323 lb (146.5 kg)   BMI 43.81 kg/m  Nursing note  and vital signs reviewed.  Physical Exam Constitutional:      General: He is not in acute distress.    Appearance: He is well-developed. He is obese.  Cardiovascular:     Rate and Rhythm: Normal rate and regular rhythm.     Heart sounds: Normal heart sounds.  Pulmonary:     Effort: Pulmonary effort is normal.     Breath sounds: Normal breath sounds.  Skin:    General: Skin is warm and dry.  Neurological:     Mental Status: He is alert.  Psychiatric:        Mood and Affect: Mood normal.        Assessment & Plan:   Problem List Items Addressed This Visit      Other   HIV INFECTION - Primary    [name] has well-controlled HIV disease with good adherence and tolerance to his ART regimen of Tivicay/Descovy.  No signs/symptoms of opportunistic infection or progressive HIV disease at present.  He has no problems obtaining his medication from the pharmacy.  We will check blood work today.  Continue current dose of Tivicay/Descovy.  Plan for follow-up in 4 months or sooner if needed with lab work on same day of appointment.      Relevant Medications   dolutegravir (TIVICAY) 50 MG tablet   emtricitabine-tenofovir AF (DESCOVY) 200-25 MG tablet   Other Relevant Orders   CBC   COMPLETE METABOLIC PANEL WITH GFR   HIV-1 RNA quant-no reflex-bld   T-helper cell (CD4)- ([facility] clinic only)   Pneumococcal conjugate vaccine 13-valent IM (Completed)   Snoring    [name] has multiple risk factors for obstructive sleep apnea including obesity, snoring, obesity and snoring with symptoms of increasing somnolence, fatigue, and headaches in the morning.  Discussed the pathogenesis of obstructive sleep apnea.  Refer to sleep medicine for sleep apnea testing.  Encouraged to lose weight with goal of 5 to 10% of body weight with information provided in after visit summary.      Relevant Orders   Ambulatory referral to Neurology   Healthcare maintenance     Prevnar updated today  Due for Menveo  at next office visit.  Discussed importance of safe sexual practice to reduce risk of transmission/aquistion of STI.        Other Visit Diagnoses    Screening for STDs (sexually transmitted diseases)       Relevant Orders   RPR       I have discontinued Brodric Domingos's risperiDONE. I have also changed his Tivicay to dolutegravir. Additionally, I am having him maintain his amLODipine, albuterol, multivitamin with minerals, clonazePAM, lamoTRIgine, albuterol, guaiFENesin, buPROPion, and emtricitabine-tenofovir AF.   Meds ordered this encounter  Medications  . dolutegravir (TIVICAY) 50 MG tablet    Sig: Take 1 tablet (50 mg total) by mouth daily.    Dispense:  30 tablet  Refill:  3    Order Specific Question:   Supervising Provider    Answer:   Judyann Munson [[id number]]  . emtricitabine-tenofovir AF (DESCOVY) 200-25 MG tablet    Sig: Take 1 tablet by mouth daily.    Dispense:  30 tablet    Refill:  3    Order Specific Question:   Supervising Provider    Answer:   Judyann Munson [[id number]]     Follow-up: Return in about 4 months (around 01/11/2019), or if symptoms worsen or fail to improve.   Marcos Eke, MSN, FNP-C Nurse Practitioner [facility] Main number: [phone]

## 2018-09-11 NOTE — Assessment & Plan Note (Signed)
Alan Rangel has multiple risk factors for obstructive sleep apnea including obesity, snoring, obesity and snoring with symptoms of increasing somnolence, fatigue, and headaches in the morning.  Discussed the pathogenesis of obstructive sleep apnea.  Refer to sleep medicine for sleep apnea testing.  Encouraged to lose weight with goal of 5 to 10% of body weight with information provided in after visit summary.

## 2018-09-11 NOTE — Patient Instructions (Signed)
Nice to see you.  We will check your blood work today.  Please continue to take your Tivicay and Descovy as prescribed.  Refills of medications will be sent to your pharmacy.  A referral for sleep medicine has been sent for evaluation of sleep apnea.  Check out [email or web address] and "The Obesity Code" by Dr. Wylene SimmerJason Fung for weight loss recommendations.  Plan for follow up with Dr. Drue SecondSnider in 4 months or sooner if needed.     Sleep Apnea Sleep apnea affects breathing during sleep. It causes breathing to stop for a short time or to become shallow. It can also increase the risk of:  Heart attack.  Stroke.  Being very overweight (obese).  Diabetes.  Heart failure.  Irregular heartbeat. The goal of treatment is to help you breathe normally again. What are the causes? There are three kinds of sleep apnea:  Obstructive sleep apnea. This is caused by a blocked or collapsed airway.  Central sleep apnea. This happens when the brain does not send the right signals to the muscles that control breathing.  Mixed sleep apnea. This is a combination of obstructive and central sleep apnea. The most common cause of this condition is a collapsed or blocked airway. This can happen if:  Your throat muscles are too relaxed.  Your tongue and tonsils are too large.  You are overweight.  Your airway is too small. What increases the risk?  Being overweight.  Smoking.  Having a small airway.  Being older.  Being male.  Drinking alcohol.  Taking medicines to calm yourself (sedatives or tranquilizers).  Having family members with the condition. What are the signs or symptoms?  Trouble staying asleep.  Being sleepy or tired during the day.  Getting angry a lot.  Loud snoring.  Headaches in the morning.  Not being able to focus your mind (concentrate).  Forgetting things.  Less interest in sex.  Mood swings.  Personality changes.  Feelings of sadness (depression).   Waking up a lot during the night to pee (urinate).  Dry mouth.  Sore throat. How is this diagnosed?  Your medical history.  A physical exam.  A test that is done when you are sleeping (sleep study). The test is most often done in a sleep lab but may also be done at home. How is this treated?   Sleeping on your side.  Using a medicine to get rid of mucus in your nose (decongestant).  Avoiding the use of alcohol, medicines to help you relax, or certain pain medicines (narcotics).  Losing weight, if needed.  Changing your diet.  Not smoking.  Using a machine to open your airway while you sleep, such as: ? An oral appliance. This is a mouthpiece that shifts your lower jaw forward. ? A CPAP device. This device blows air through a mask when you breathe out (exhale). ? An EPAP device. This has valves that you put in each nostril. ? A BPAP device. This device blows air through a mask when you breathe in (inhale) and breathe out.  Having surgery if other treatments do not work. It is important to get treatment for sleep apnea. Without treatment, it can lead to:  High blood pressure.  Coronary artery disease.  In men, not being able to have an erection (impotence).  Reduced thinking ability. Follow these instructions at home: Lifestyle  Make changes that your doctor recommends.  Eat a healthy diet.  Lose weight if needed.  Avoid alcohol, medicines to help you  relax, and some pain medicines.  Do not use any products that contain nicotine or tobacco, such as cigarettes, e-cigarettes, and chewing tobacco. If you need help quitting, ask your doctor. General instructions  Take over-the-counter and prescription medicines only as told by your doctor.  If you were given a machine to use while you sleep, use it only as told by your doctor.  If you are having surgery, make sure to tell your doctor you have sleep apnea. You may need to bring your device with you.  Keep all  follow-up visits as told by your doctor. This is important. Contact a doctor if:  The machine that you were given to use during sleep bothers you or does not seem to be working.  You do not get better.  You get worse. Get help right away if:  Your chest hurts.  You have trouble breathing in enough air.  You have an uncomfortable feeling in your back, arms, or stomach.  You have trouble talking.  One side of your body feels weak.  A part of your face is hanging down. These symptoms may be an emergency. Do not wait to see if the symptoms will go away. Get medical help right away. Call your local emergency services (911 in the [place] Do not drive yourself to the hospital. Summary  This condition affects breathing during sleep.  The most common cause is a collapsed or blocked airway.  The goal of treatment is to help you breathe normally while you sleep. This information is not intended to replace advice given to you by your health care provider. Make sure you discuss any questions you have with your health care provider. Document Released: 02/07/2008 Document Revised: 12/24/2017 Document Reviewed: 12/24/2017 Elsevier Interactive Patient Education  [employer].

## 2018-09-11 NOTE — Assessment & Plan Note (Signed)
Mr. Gorelik has well-controlled HIV disease with good adherence and tolerance to his ART regimen of Tivicay/Descovy.  No signs/symptoms of opportunistic infection or progressive HIV disease at present.  He has no problems obtaining his medication from the pharmacy.  We will check blood work today.  Continue current dose of Tivicay/Descovy.  Plan for follow-up in 4 months or sooner if needed with lab work on same day of appointment.

## 2018-09-11 NOTE — Assessment & Plan Note (Signed)
   Prevnar updated today  Due for Menveo at next office visit.  Discussed importance of safe sexual practice to reduce risk of transmission/aquistion of STI.

## 2018-09-12 LAB — T-HELPER CELL (CD4) - (RCID CLINIC ONLY)
CD4 % Helper T Cell: 47 % (ref 33–65)
CD4 T Cell Abs: 1670 /uL (ref 400–1790)

## 2018-09-16 LAB — COMPLETE METABOLIC PANEL WITH GFR
AG Ratio: 1.9 (calc) (ref 1.0–2.5)
ALT: 23 U/L (ref 9–46)
AST: 17 U/L (ref 10–40)
Albumin: 4.6 g/dL (ref 3.6–5.1)
Alkaline phosphatase (APISO): 119 U/L (ref 36–130)
BUN: 10 mg/dL (ref 7–25)
CO2: 32 mmol/L (ref 20–32)
Calcium: 9.3 mg/dL (ref 8.6–10.3)
Chloride: 105 mmol/L (ref 98–110)
Creat: 1.27 mg/dL (ref 0.60–1.35)
GFR, Est African American: 81 mL/min/{1.73_m2} (ref >=60)
GFR, Est Non African American: 70 mL/min/{1.73_m2} (ref >=60)
Globulin: 2.4 g/dL (calc) (ref 1.9–3.7)
Glucose, Bld: 108 mg/dL — ABNORMAL HIGH (ref 65–99)
Potassium: 4.2 mmol/L (ref 3.5–5.3)
Sodium: 142 mmol/L (ref 135–146)
Total Bilirubin: 0.3 mg/dL (ref 0.2–1.2)
Total Protein: 7 g/dL (ref 6.1–8.1)

## 2018-09-16 LAB — CBC
HCT: 47.4 % (ref 38.5–50.0)
Hemoglobin: 16.4 g/dL (ref 13.2–17.1)
MCH: 31.9 pg (ref 27.0–33.0)
MCHC: 34.6 g/dL (ref 32.0–36.0)
MCV: 92.2 fL (ref 80.0–100.0)
MPV: 9.7 fL (ref 7.5–12.5)
Platelets: 224 10*3/uL (ref 140–400)
RBC: 5.14 10*6/uL (ref 4.20–5.80)
RDW: 12.9 % (ref 11.0–15.0)
WBC: 9.1 10*3/uL (ref 3.8–10.8)

## 2018-09-16 LAB — HIV-1 RNA QUANT-NO REFLEX-BLD
HIV 1 RNA Quant: <20 copies/mL — AB
HIV-1 RNA Quant, Log: <1.3 Log copies/mL — AB

## 2018-09-16 LAB — RPR: RPR Ser Ql: NONREACTIVE

## 2018-11-07 ENCOUNTER — Emergency Department (HOSPITAL_COMMUNITY)
Admission: EM | Admit: 2018-11-07 | Discharge: 2018-11-07 | Disposition: A | Discharge status: Home / Self Care | Payer: Medicare HMO | Attending: Emergency Medicine | Admitting: Emergency Medicine

## 2018-11-07 ENCOUNTER — Other Ambulatory Visit: Payer: Self-pay

## 2018-11-07 DIAGNOSIS — K0889 Other specified disorders of teeth and supporting structures: Secondary | ICD-10-CM | POA: Diagnosis not present

## 2018-11-07 DIAGNOSIS — Z79899 Other long term (current) drug therapy: Secondary | ICD-10-CM | POA: Diagnosis not present

## 2018-11-07 DIAGNOSIS — F17210 Nicotine dependence, cigarettes, uncomplicated: Secondary | ICD-10-CM | POA: Diagnosis not present

## 2018-11-07 DIAGNOSIS — B20 Human immunodeficiency virus [HIV] disease: Secondary | ICD-10-CM | POA: Insufficient documentation

## 2018-11-07 DIAGNOSIS — G501 Atypical facial pain: Secondary | ICD-10-CM | POA: Diagnosis not present

## 2018-11-07 DIAGNOSIS — I10 Essential (primary) hypertension: Secondary | ICD-10-CM | POA: Diagnosis not present

## 2018-11-07 MED ORDER — HYDROCODONE-ACETAMINOPHEN 5-325 MG PO TABS: 1.0000 | ORAL_TABLET | Freq: Four times a day (QID) | ORAL | 0 refills | Status: DC | PRN

## 2018-11-07 MED ORDER — AMOXICILLIN 500 MG PO CAPS: 500.0000 mg | ORAL_CAPSULE | Freq: Three times a day (TID) | ORAL | 0 refills | Status: DC

## 2018-11-07 MED ORDER — NAPROXEN 250 MG PO TABS
500.0000 mg | ORAL_TABLET | Freq: Once | ORAL | Status: AC
  Administered 2018-11-07: 500 mg via ORAL
  Filled 2018-11-07: qty 2

## 2018-11-07 MED ORDER — AMOXICILLIN 500 MG PO CAPS
500.0000 mg | ORAL_CAPSULE | Freq: Once | ORAL | Status: AC
  Administered 2018-11-07: 500 mg via ORAL
  Filled 2018-11-07: qty 1

## 2018-11-07 NOTE — Discharge Instructions (Signed)
Follow-up with a dentist.  Only a dentist can fix your problem.  We recommend that you take amoxicillin as prescribed to treat likely underlying infection.  Take ibuprofen as prescribed for pain control.  You have been prescribed Norco to take as needed for severe pain.  Do not drive or drink alcohol after taking this medication as it may make you drowsy and impair your judgment.  Return to the ED for any new or concerning symptoms.
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
## 2018-11-07 NOTE — ED Triage Notes (Signed)
Per pt he has a severe pain in his left right upper gum and tooth. Pt says it is so sore to touch with his tongue. Swelling in his face a little. Pt aslo having sore throat only on right side. Said white spots on his tongue.

## 2019-01-12 ENCOUNTER — Ambulatory Visit: Payer: Medicare HMO | Admitting: Family

## 2019-01-20 ENCOUNTER — Other Ambulatory Visit: Payer: Self-pay | Admitting: Family

## 2019-01-20 ENCOUNTER — Ambulatory Visit (INDEPENDENT_AMBULATORY_CARE_PROVIDER_SITE_OTHER): Payer: Medicare HMO | Admitting: Family

## 2019-01-20 ENCOUNTER — Other Ambulatory Visit: Payer: Self-pay

## 2019-01-20 ENCOUNTER — Encounter: Payer: Self-pay | Admitting: Family

## 2019-01-20 VITALS — BP 153/98 | HR 69 | Temp 98.9°F

## 2019-01-20 DIAGNOSIS — Z23 Encounter for immunization: Secondary | ICD-10-CM

## 2019-01-20 DIAGNOSIS — Z0000 Encounter for general adult medical examination without abnormal findings: Secondary | ICD-10-CM | POA: Diagnosis not present

## 2019-01-20 DIAGNOSIS — B20 Human immunodeficiency virus [HIV] disease: Secondary | ICD-10-CM

## 2019-01-20 DIAGNOSIS — I10 Essential (primary) hypertension: Secondary | ICD-10-CM

## 2019-01-20 DIAGNOSIS — F411 Generalized anxiety disorder: Secondary | ICD-10-CM

## 2019-01-20 DIAGNOSIS — M26609 Unspecified temporomandibular joint disorder, unspecified side: Secondary | ICD-10-CM | POA: Insufficient documentation

## 2019-01-20 DIAGNOSIS — L309 Dermatitis, unspecified: Secondary | ICD-10-CM

## 2019-01-20 MED ORDER — DESCOVY 200-25 MG PO TABS: 1.0000 | ORAL_TABLET | Freq: Every day | ORAL | 3 refills | Status: DC

## 2019-01-20 MED ORDER — ALBUTEROL SULFATE HFA 108 (90 BASE) MCG/ACT IN AERS: 1.0000 | INHALATION_SPRAY | Freq: Four times a day (QID) | RESPIRATORY_TRACT | 1 refills | Status: DC | PRN

## 2019-01-20 MED ORDER — TIVICAY 50 MG PO TABS: 50.0000 mg | ORAL_TABLET | Freq: Every day | ORAL | 3 refills | Status: DC

## 2019-01-20 MED ORDER — TRIAMCINOLONE ACETONIDE 0.5 % EX OINT: 1.0000 "application " | TOPICAL_OINTMENT | Freq: Two times a day (BID) | CUTANEOUS | 0 refills | Status: DC

## 2019-01-20 NOTE — Assessment & Plan Note (Signed)
Blood pressure slightly elevated above goal 140/90 likely related to anxiety.  Continue current dose of amlodipine.  If blood pressure remains elevated may consider additional blood pressure medication.

## 2019-01-20 NOTE — Patient Instructions (Addendum)
Nice to see you.  Please continue to take your Tivicay and Descovy.  We will check your blood work today.  A prescription for Kenalog cream has been sent to the pharmacy for your rash on your knee. If your symptoms do not improve may have to see dermatology.  It sounds like you have some tight muscles of your neck likely related to temperomandibular joint dysfunction/inflammation. Recommend follow up with your dentist and possibly primary care.  Warm, moist heat for your neck for about 20 minutes every 2 hours as needed with stretches below.  Plan for follow up in 4 months or sooner if needed.   Have a great day and stay safe!    Cervical Strain and Sprain Rehab Ask your health care provider which exercises are safe for you. Do exercises exactly as told by your health care provider and adjust them as directed. It is normal to feel mild stretching, pulling, tightness, or discomfort as you do these exercises. Stop right away if you feel sudden pain or your pain gets worse. Do not begin these exercises until told by your health care provider. Stretching and range-of-motion exercises Cervical side bending  1. Using good posture, sit on a stable chair or stand up. 2. Without moving your shoulders, slowly tilt your left / right ear to your shoulder until you feel a stretch in the opposite side neck muscles. You should be looking straight ahead. 3. Hold for __________ seconds. 4. Repeat with the other side of your neck. Repeat __________ times. Complete this exercise __________ times a day. Cervical rotation  1. Using good posture, sit on a stable chair or stand up. 2. Slowly turn your head to the side as if you are looking over your left / right shoulder. ? Keep your eyes level with the ground. ? Stop when you feel a stretch along the side and the back of your neck. 3. Hold for __________ seconds. 4. Repeat this by turning to your other side. Repeat __________ times. Complete this  exercise __________ times a day. Thoracic extension and pectoral stretch 1. Roll a towel or a small blanket so it is about 4 inches (10 cm) in diameter. 2. Lie down on your back on a firm surface. 3. Put the towel lengthwise, under your spine in the middle of your back. It should not be under your shoulder blades. The towel should line up with your spine from your middle back to your lower back. 4. Put your hands behind your head and let your elbows fall out to your sides. 5. Hold for __________ seconds. Repeat __________ times. Complete this exercise __________ times a day. Strengthening exercises Isometric upper cervical flexion 1. Lie on your back with a thin pillow behind your head and a small rolled-up towel under your neck. 2. Gently tuck your chin toward your chest and nod your head down to look toward your feet. Do not lift your head off the pillow. 3. Hold for __________ seconds. 4. Release the tension slowly. Relax your neck muscles completely before you repeat this exercise. Repeat __________ times. Complete this exercise __________ times a day. Isometric cervical extension  1. Stand about 6 inches (15 cm) away from a wall, with your back facing the wall. 2. Place a soft object, about 6-8 inches (15-20 cm) in diameter, between the back of your head and the wall. A soft object could be a small pillow, a ball, or a folded towel. 3. Gently tilt your head back and press  into the soft object. Keep your jaw and forehead relaxed. 4. Hold for __________ seconds. 5. Release the tension slowly. Relax your neck muscles completely before you repeat this exercise. Repeat __________ times. Complete this exercise __________ times a day. Posture and body mechanics Body mechanics refers to the movements and positions of your body while you do your daily activities. Posture is part of body mechanics. Good posture and healthy body mechanics can help to relieve stress in your body's tissues and  joints. Good posture means that your spine is in its natural S-curve position (your spine is neutral), your shoulders are pulled back slightly, and your head is not tipped forward. The following are general guidelines for applying improved posture and body mechanics to your everyday activities. Sitting  1. When sitting, keep your spine neutral and keep your feet flat on the floor. Use a footrest, if necessary, and keep your thighs parallel to the floor. Avoid rounding your shoulders, and avoid tilting your head forward. 2. When working at a desk or a computer, keep your desk at a height where your hands are slightly lower than your elbows. Slide your chair under your desk so you are close enough to maintain good posture. 3. When working at a computer, place your monitor at a height where you are looking straight ahead and you do not have to tilt your head forward or downward to look at the screen. Standing   When standing, keep your spine neutral and keep your feet about hip-width apart. Keep a slight bend in your knees. Your ears, shoulders, and hips should line up.  When you do a task in which you stand in one place for a long time, place one foot up on a stable object that is 2-4 inches (5-10 cm) high, such as a footstool. This helps keep your spine neutral. Resting When lying down and resting, avoid positions that are most painful for you. Try to support your neck in a neutral position. You can use a contour pillow or a small rolled-up towel. Your pillow should support your neck but not push on it. This information is not intended to replace advice given to you by your health care provider. Make sure you discuss any questions you have with your health care provider. Document Released: 04/30/2005 Document Revised: 08/20/2018 Document Reviewed: 01/29/2018 Elsevier Patient Education  2020 [employer].

## 2019-01-20 NOTE — Assessment & Plan Note (Signed)
Appears adequately controlled at present with no signs of exacerbation.  Continue treatment per psychiatry.

## 2019-01-20 NOTE — Assessment & Plan Note (Signed)
   Tetanus and influenza updated today.  Discussed importance of safe sexual practice to reduce risk of acquisition/transmission of STI.  Encourage dental screening as able.

## 2019-01-20 NOTE — Assessment & Plan Note (Signed)
Mr. Eads appears to have eczematous rash located on his knee and lateral right calf refractory to [place].  Will prescribe triamcinolone 0.5% cream to be used twice daily.  Encouraged moisturizing with Hulan Fray, Aveeno, or Eucerin products.  Recommend follow-up with primary care or dermatology if symptoms worsen or do not improve.

## 2019-01-20 NOTE — Assessment & Plan Note (Addendum)
Mr. Italiano has generally well-controlled HIV disease with good adherence and tolerance to his ART regimen of Tivicay and Descovy.  No current signs/symptoms of opportunistic infection or progressive HIV disease at present.  He has no problems obtaining his medication from the pharmacy.  Check blood work today.  Continue current dose of Tivicay and Descovy.  Plan for follow-up in 4 months or sooner if needed with lab work 1 to 2 weeks prior to appointment or on same day.
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
## 2019-01-20 NOTE — Assessment & Plan Note (Signed)
Alan Rangel appears to have TMJ dysfunction resulting in his feelings of having clicking/locking of his jaw at times with no current symptoms at present.  This is also likely contributing to his neck stiffness and pain.  Recommend follow-up with dentistry to evaluate for possible teeth grinding and or further evaluation as needed.

## 2019-01-21 LAB — T-HELPER CELL (CD4) - (RCID CLINIC ONLY)
CD4 % Helper T Cell: 46 % (ref 33–65)
CD4 T Cell Abs: 2016 /uL — ABNORMAL HIGH (ref 400–1790)

## 2019-01-22 LAB — COMPLETE METABOLIC PANEL WITH GFR
AG Ratio: 2.1 (calc) (ref 1.0–2.5)
ALT: 25 U/L (ref 9–46)
AST: 16 U/L (ref 10–40)
Albumin: 4.5 g/dL (ref 3.6–5.1)
Alkaline phosphatase (APISO): 102 U/L (ref 36–130)
BUN: 7 mg/dL (ref 7–25)
CO2: 28 mmol/L (ref 20–32)
Calcium: 9.2 mg/dL (ref 8.6–10.3)
Chloride: 105 mmol/L (ref 98–110)
Creat: 1.18 mg/dL (ref 0.60–1.35)
GFR, Est African American: 89 mL/min/{1.73_m2} (ref >=60)
GFR, Est Non African American: 77 mL/min/{1.73_m2} (ref >=60)
Globulin: 2.1 g/dL (calc) (ref 1.9–3.7)
Glucose, Bld: 96 mg/dL (ref 65–99)
Potassium: 3.7 mmol/L (ref 3.5–5.3)
Sodium: 140 mmol/L (ref 135–146)
Total Bilirubin: 0.3 mg/dL (ref 0.2–1.2)
Total Protein: 6.6 g/dL (ref 6.1–8.1)

## 2019-01-22 LAB — HIV-1 RNA QUANT-NO REFLEX-BLD
HIV 1 RNA Quant: <20 copies/mL
HIV-1 RNA Quant, Log: <1.3 Log copies/mL

## 2019-04-27 ENCOUNTER — Other Ambulatory Visit: Payer: Self-pay

## 2019-04-27 ENCOUNTER — Encounter (HOSPITAL_COMMUNITY): Payer: Self-pay | Admitting: Emergency Medicine

## 2019-04-27 ENCOUNTER — Emergency Department (HOSPITAL_COMMUNITY): Payer: Medicare HMO

## 2019-04-27 ENCOUNTER — Emergency Department (HOSPITAL_COMMUNITY)
Admission: EM | Admit: 2019-04-27 | Discharge: 2019-04-27 | Disposition: A | Discharge status: Home / Self Care | Payer: Medicare HMO | Attending: Emergency Medicine | Admitting: Emergency Medicine

## 2019-04-27 DIAGNOSIS — F419 Anxiety disorder, unspecified: Secondary | ICD-10-CM | POA: Diagnosis not present

## 2019-04-27 LAB — TROPONIN I (HIGH SENSITIVITY): Troponin I (High Sensitivity): 6 ng/L (ref <18)

## 2019-04-27 LAB — CBC
HCT: 52.3 % — ABNORMAL HIGH (ref 39.0–52.0)
Hemoglobin: 17.5 g/dL — ABNORMAL HIGH (ref 13.0–17.0)
MCH: 32.4 pg (ref 26.0–34.0)
MCHC: 33.5 g/dL (ref 30.0–36.0)
MCV: 96.9 fL (ref 80.0–100.0)
Platelets: 242 10*3/uL (ref 150–400)
RBC: 5.4 MIL/uL (ref 4.22–5.81)
RDW: 12.9 % (ref 11.5–15.5)
WBC: 11.1 10*3/uL — ABNORMAL HIGH (ref 4.0–10.5)
nRBC: 0 % (ref 0.0–0.2)

## 2019-04-27 LAB — BASIC METABOLIC PANEL
Anion gap: 9 (ref 5–15)
BUN: 11 mg/dL (ref 6–20)
CO2: 28 mmol/L (ref 22–32)
Calcium: 9.5 mg/dL (ref 8.9–10.3)
Chloride: 102 mmol/L (ref 98–111)
Creatinine, Ser: 1.1 mg/dL (ref 0.61–1.24)
GFR calc Af Amer: >60 mL/min (ref >60)
GFR calc non Af Amer: >60 mL/min (ref >60)
Glucose, Bld: 96 mg/dL (ref 70–99)
Potassium: 4.4 mmol/L (ref 3.5–5.1)
Sodium: 139 mmol/L (ref 135–145)

## 2019-04-27 IMAGING — CR DG CHEST 2V
2 series · 2 of 2 positions shown · non-contrast
Comparison: [DATE]

CLINICAL DATA: Worsening anxiety, chest pain

EXAM:
CHEST - 2 VIEW

[w chest pa]
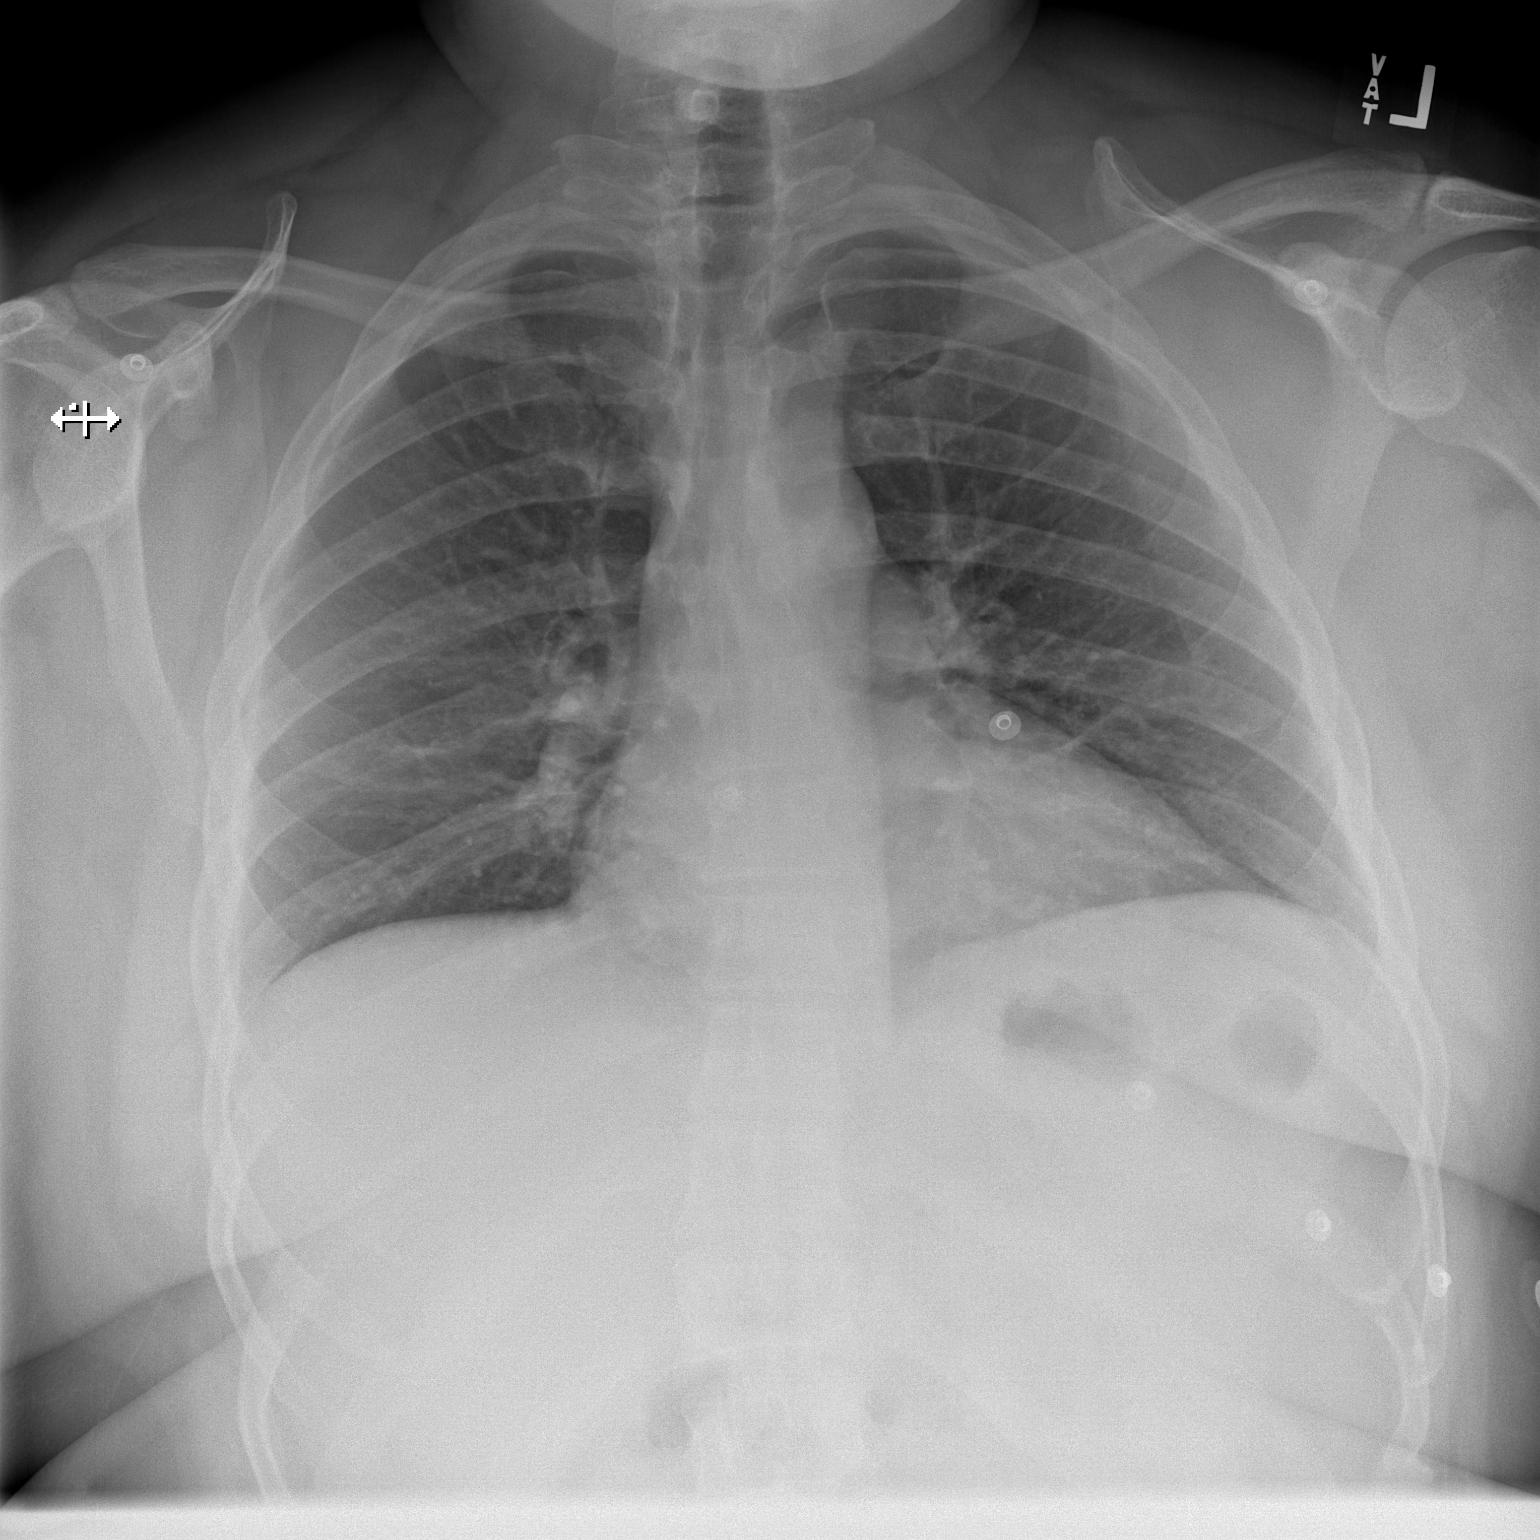

[w chest lat]
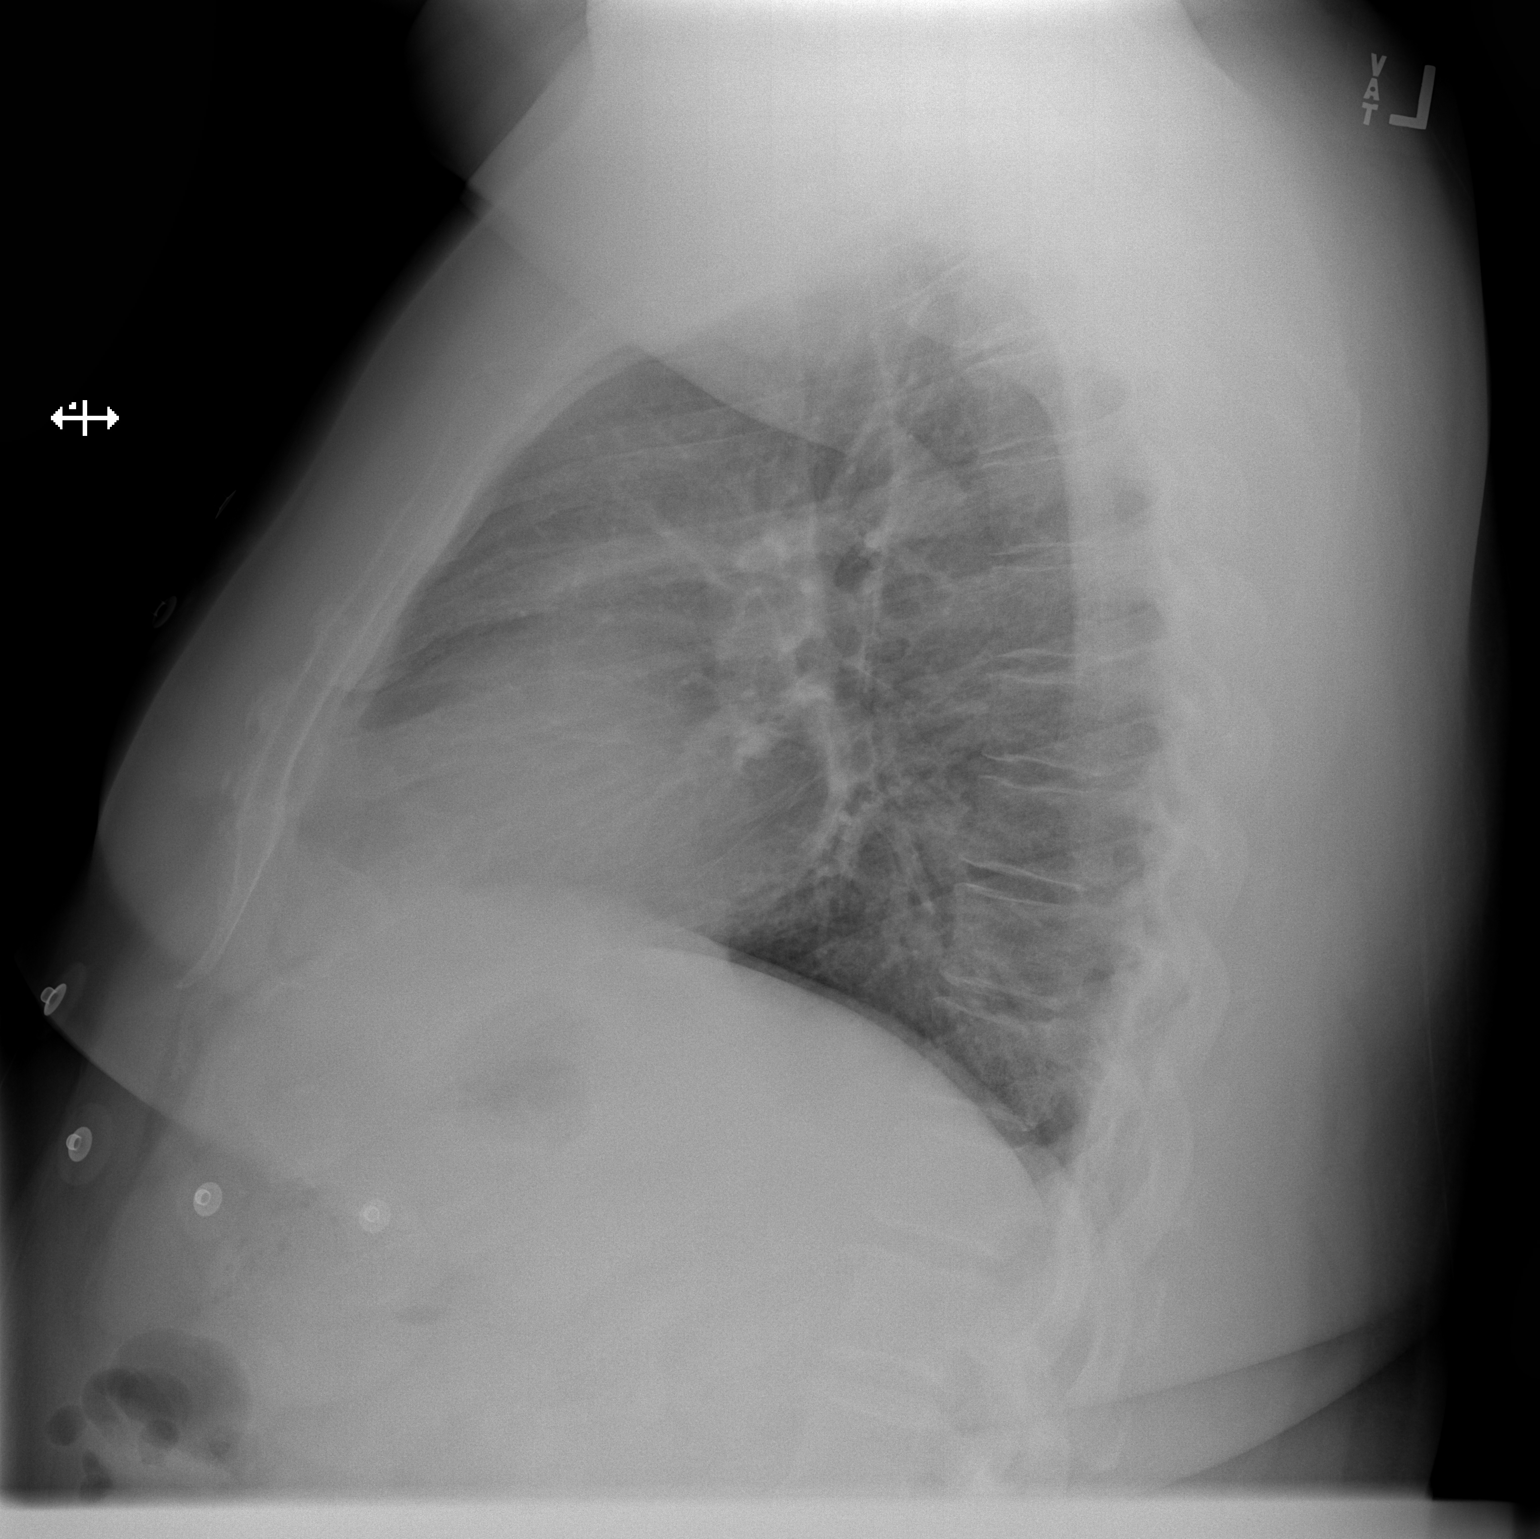

[2 of 2 positions shown; findings below may reference images not displayed]

FINDINGS: Heart and mediastinal contours are within normal limits. No focal
opacities or effusions. No acute bony abnormality.
IMPRESSION: No active cardiopulmonary disease.

## 2019-04-27 MED ORDER — SODIUM CHLORIDE 0.9% FLUSH: 3.0000 mL | Freq: Once | INTRAVENOUS | Status: DC

## 2019-04-27 NOTE — ED Triage Notes (Signed)
Per EMS pt complaint of worsening anxiety, nervousness month; recent stressors. Pt complaint of back pain and n/v after eating for x4 days.

## 2019-05-21 ENCOUNTER — Other Ambulatory Visit: Payer: Self-pay

## 2019-05-21 ENCOUNTER — Other Ambulatory Visit: Payer: Medicare HMO

## 2019-05-21 DIAGNOSIS — B20 Human immunodeficiency virus [HIV] disease: Secondary | ICD-10-CM

## 2019-05-21 LAB — CBC WITH DIFFERENTIAL/PLATELET
Absolute Monocytes: 788 cells/uL (ref 200–950)
Basophils Absolute: 33 cells/uL (ref 0–200)
Basophils Relative: 0.3 %
Eosinophils Absolute: 178 cells/uL (ref 15–500)
Eosinophils Relative: 1.6 %
HCT: 48.8 % (ref 38.5–50.0)
Hemoglobin: 16.5 g/dL (ref 13.2–17.1)
Lymphs Abs: 5339 cells/uL — ABNORMAL HIGH (ref 850–3900)
MCH: 31.4 pg (ref 27.0–33.0)
MCHC: 33.8 g/dL (ref 32.0–36.0)
MCV: 92.8 fL (ref 80.0–100.0)
MPV: 9.5 fL (ref 7.5–12.5)
Monocytes Relative: 7.1 %
Neutro Abs: 4762 cells/uL (ref 1500–7800)
Neutrophils Relative %: 42.9 %
Platelets: 242 10*3/uL (ref 140–400)
RBC: 5.26 10*6/uL (ref 4.20–5.80)
RDW: 12.5 % (ref 11.0–15.0)
Total Lymphocyte: 48.1 %
WBC: 11.1 10*3/uL — ABNORMAL HIGH (ref 3.8–10.8)

## 2019-05-22 ENCOUNTER — Encounter: Payer: Self-pay | Admitting: Family

## 2019-05-22 LAB — T-HELPER CELL (CD4) - (RCID CLINIC ONLY)
CD4 % Helper T Cell: 45 % (ref 33–65)
CD4 T Cell Abs: 2245 /uL — ABNORMAL HIGH (ref 400–1790)

## 2019-06-04 ENCOUNTER — Other Ambulatory Visit: Payer: Self-pay | Admitting: Family

## 2019-06-08 ENCOUNTER — Encounter: Payer: Self-pay | Admitting: Family

## 2019-06-08 ENCOUNTER — Ambulatory Visit (INDEPENDENT_AMBULATORY_CARE_PROVIDER_SITE_OTHER): Payer: Medicare HMO | Admitting: Family

## 2019-06-08 ENCOUNTER — Other Ambulatory Visit: Payer: Self-pay

## 2019-06-08 DIAGNOSIS — G8929 Other chronic pain: Secondary | ICD-10-CM

## 2019-06-08 DIAGNOSIS — M5441 Lumbago with sciatica, right side: Secondary | ICD-10-CM

## 2019-06-08 DIAGNOSIS — B20 Human immunodeficiency virus [HIV] disease: Secondary | ICD-10-CM | POA: Diagnosis not present

## 2019-06-08 DIAGNOSIS — M5442 Lumbago with sciatica, left side: Secondary | ICD-10-CM

## 2019-06-08 DIAGNOSIS — Z0000 Encounter for general adult medical examination without abnormal findings: Secondary | ICD-10-CM | POA: Diagnosis not present

## 2019-06-08 DIAGNOSIS — Z113 Encounter for screening for infections with a predominantly sexual mode of transmission: Secondary | ICD-10-CM | POA: Diagnosis not present

## 2019-06-08 NOTE — Patient Instructions (Signed)
Nice to speak with you.   Continue to follow up with primary care / orthopedics for your back.   Continue to take your Tivicay and Descovy as prescribed daily.  Refills of medication will be sent to the pharmacy.  Plan for follow up in 4 months or sooner if needed with lab work 1-2 weeks prior to appointment or on same day.   Have a great day and stay safe!

## 2019-06-08 NOTE — Assessment & Plan Note (Signed)
Alan Rangel continues to have well-controlled HIV disease with good adherence and tolerance to his ART regimen of Tivicay and Descovy.  No signs/symptoms of opportunistic infection or progressive HIV disease.  We reviewed his lab work and discussed the plan of care.  Continue current dose of Tivicay and Descovy.  Plan for follow-up in 4 months or sooner if needed with lab work 1 to 2 weeks prior to appointment or on the same day.

## 2019-06-08 NOTE — Progress Notes (Signed)
Subjective:    Patient ID: Alan Rangel, male    DOB: 05-Jan-1976, 44 y.o.   MRN: [id number]  Chief Complaint  Patient presents with  . HIV Positive/AIDS     Virtual Visit via Telephone Note   I connected with [name] on 06/08/2019 at  12:15 pm by telephone and verified that I am speaking with the correct person using two identifiers.   I discussed the limitations, risks, security and privacy concerns of performing an evaluation and management service by telephone and the availability of in person appointments. I also discussed with the patient that there may be a patient responsible charge related to this service. The patient expressed understanding and agreed to proceed.   HPI:  Alan Rangel is a 44 y.o. male with HIV disease who was last seen in the office on 01/20/2019 with good adherence and tolerance to his ART regimen of Tivicay and Descovy.  Viral load at the time was found to be undetectable with CD4 count of 2016.  Most recent blood work completed on 05/21/2019 with CD4 count of 2245.  Viral load was not drawn at that time.  Alan Rangel continues to take his Tivicay and Descovy as prescribed with no adverse side effects or missed doses since his last office visit.  Overall feeling okay however been having new onset back pain over the last several months and currently working with primary care as well as specialists with concern for scoliosis.  Severity is enough to alter his activities of daily living requiring him to use a cane at times.  He was found to have low vitamin D levels as well. Denies fevers, chills, night sweats, headaches, changes in vision, neck pain/stiffness, nausea, diarrhea, vomiting, lesions or rashes.  Alan Rangel has no problems obtaining his medication from the pharmacy and remains covered through [facility].  Denies feelings of being down, depressed, or hopeless recently.  No recreational or illicit drug use, tobacco use, or alcohol consumption.  He is not  currently sexually active.   No Known Allergies    Outpatient Medications Prior to Visit  Medication Sig Dispense Refill  . albuterol (VENTOLIN HFA) 108 (90 Base) MCG/ACT inhaler Inhale 1-2 puffs into the lungs every 6 (six) hours as needed for wheezing or shortness of breath. 18 g 1  . amLODipine (NORVASC) 2.5 MG tablet Take 2.5 mg by mouth daily.    . benztropine (COGENTIN) 0.5 MG tablet Take by mouth.    Marland Kitchen buPROPion (WELLBUTRIN XL) 150 MG 24 hr tablet Take 150 mg by mouth daily.    . clonazePAM (KLONOPIN) 0.5 MG tablet Take 1 tablet (0.5 mg total) by mouth 2 (two) times daily as needed (Anxiety). 10 tablet 0  . DESCOVY 200-25 MG tablet TAKE 1 TABLET BY MOUTH DAILY 30 tablet 3  . guaiFENesin (MUCINEX) 600 MG 12 hr tablet Take 600 mg by mouth.    . lamoTRIgine (LAMICTAL) 100 MG tablet Take 1 tablet (100 mg total) by mouth daily. For mood control 30 tablet 0  . Multiple Vitamin (MULTIVITAMIN WITH MINERALS) TABS tablet Take 1 tablet by mouth daily.    . risperiDONE (RISPERDAL) 1 MG tablet TAKE 1/2 TABLET EVERY MORNING AND 1 TABLET AT BEDTIME    . TIVICAY 50 MG tablet TAKE 1 TABLET(50 MG) BY MOUTH DAILY 30 tablet 3  . triamcinolone ointment (KENALOG) 0.5 % Apply 1 application topically 2 (two) times daily. 30 g 0   No facility-administered medications prior to visit.  Past Medical History:  Diagnosis Date  . Depression   . Episodic mood disorder (HCC)   . Generalized anxiety disorder   . HIV (human immunodeficiency virus infection) (HCC)   . HTN (hypertension)   . Panic attack   . Paranoid schizophrenia (HCC)   . Personality disorder (HCC)   . Schizoaffective disorder, bipolar type [facility])      Past Surgical History:  Procedure Laterality Date  . SKIN GRAFT Right    foot/leg    Review of Systems  Constitutional: Negative for appetite change, chills, fatigue, fever and unexpected weight change.  Eyes: Negative for visual disturbance.  Respiratory: Negative for cough,  chest tightness, shortness of breath and wheezing.   Cardiovascular: Negative for chest pain and leg swelling.  Gastrointestinal: Negative for abdominal pain, constipation, diarrhea, nausea and vomiting.  Genitourinary: Negative for dysuria, flank pain, frequency, genital sores, hematuria and urgency.  Musculoskeletal: Positive for back pain.  Skin: Negative for rash.  Allergic/Immunologic: Negative for immunocompromised state.  Neurological: Negative for dizziness and headaches.      Objective:    Nursing note and vital signs reviewed.    Alan Rangel sounds to be doing well and is pleasant to speak with.  Physical examination limited due to E visit. Assessment & Plan:   Problem List Items Addressed This Visit      Other   HIV INFECTION - Primary    Mr. Trimpe continues to have well-controlled HIV disease with good adherence and tolerance to his ART regimen of Tivicay and Descovy.  No signs/symptoms of opportunistic infection or progressive HIV disease.  We reviewed his lab work and discussed the plan of care.  Continue current dose of Tivicay and Descovy.  Plan for follow-up in 4 months or sooner if needed with lab work 1 to 2 weeks prior to appointment or on the same day.      Relevant Orders   HIV-1 RNA quant-no reflex-bld   T-helper cell (CD4)- ([facility] clinic only)   Comprehensive metabolic panel   CBC   Chronic bilateral low back pain    Alan Rangel appears to have chronic low back pain associated with scoliosis and currently being followed by primary care and specialty.  He is on pain medication as well as anti-inflammatories.  Continue management per primary care and specialty offices.      Healthcare maintenance     Discussed importance of safe sexual practice to reduce risk of STI.  Encouraged routine dental care as able.       Other Visit Diagnoses    Screening for STDs (sexually transmitted diseases)       Relevant Orders   RPR       I am having Alan Rangel  maintain his amLODipine, multivitamin with minerals, clonazePAM, lamoTRIgine, guaiFENesin, buPROPion, risperiDONE, benztropine, triamcinolone ointment, albuterol, Descovy, and Tivicay.   I discussed the assessment and treatment plan with the patient. The patient was provided an opportunity to ask questions and all were answered. The patient agreed with the plan and demonstrated an understanding of the instructions.   The patient was advised to call back or seek an in-person evaluation if the symptoms worsen or if the condition fails to improve as anticipated.   I provided 15  minutes of non-face-to-face time during this encounter.  Follow-up: Return in about 3 months (around 09/06/2019), or if symptoms worsen or fail to improve.   Marcos Eke, MSN, FNP-C Nurse Practitioner [facility]  Main number: [phone]

## 2019-06-08 NOTE — Assessment & Plan Note (Signed)
Alan Rangel appears to have chronic low back pain associated with scoliosis and currently being followed by primary care and specialty.  He is on pain medication as well as anti-inflammatories.  Continue management per primary care and specialty offices.

## 2019-06-08 NOTE — Assessment & Plan Note (Signed)
   Discussed importance of safe sexual practice to reduce risk of STI.  Encouraged routine dental care as able.

## 2019-06-09 ENCOUNTER — Telehealth: Payer: Self-pay | Admitting: *Deleted

## 2019-06-09 NOTE — Telephone Encounter (Signed)
Left message letting patient know medication was delivered from the pharmacy to his doctor's office. Andree Coss, RN

## 2019-06-11 ENCOUNTER — Telehealth: Payer: Self-pay

## 2019-06-11 NOTE — Telephone Encounter (Signed)
-----   Message from Veryl Speak, FNP sent at 06/08/2019  2:18 PM EST ----- Can we get Mr. Lachman scheduled for a 4 month follow up with lab work 1-2 weeks prior to appointment please?  Thank you!

## 2019-06-11 NOTE — Telephone Encounter (Signed)
Left patient a voice mail to call back to schedule a 4 month follow up with Tammy Sours with labs 2 weeks before (around 09/06/2019)

## 2019-07-15 NOTE — Telephone Encounter (Signed)
Patient picked up his medication today from the clinic.

## 2019-07-22 ENCOUNTER — Other Ambulatory Visit (HOSPITAL_COMMUNITY): Payer: Self-pay | Admitting: Family Medicine

## 2019-07-22 ENCOUNTER — Other Ambulatory Visit: Payer: Self-pay | Admitting: Family Medicine

## 2019-07-22 DIAGNOSIS — R42 Dizziness and giddiness: Secondary | ICD-10-CM

## 2019-08-05 ENCOUNTER — Ambulatory Visit (HOSPITAL_COMMUNITY): Admission: RE | Admit: 2019-08-05 | Payer: Medicare HMO | Source: Ambulatory Visit

## 2019-08-05 ENCOUNTER — Encounter (HOSPITAL_COMMUNITY): Payer: Self-pay

## 2019-09-21 ENCOUNTER — Telehealth: Payer: Self-pay | Admitting: *Deleted

## 2019-09-21 NOTE — Telephone Encounter (Signed)
RN left message letting Alan Rangel know his medication was ready for pick up at his doctor's office. Andree Coss, RN

## 2019-09-24 ENCOUNTER — Ambulatory Visit: Payer: Medicare HMO

## 2019-10-09 ENCOUNTER — Ambulatory Visit: Payer: Medicare HMO | Admitting: Family

## 2019-10-15 ENCOUNTER — Other Ambulatory Visit: Payer: Self-pay | Admitting: Family

## 2019-11-12 ENCOUNTER — Other Ambulatory Visit: Payer: Medicare HMO

## 2019-11-12 ENCOUNTER — Other Ambulatory Visit: Payer: Self-pay | Admitting: Family

## 2019-11-19 ENCOUNTER — Telehealth: Payer: Self-pay

## 2019-11-19 NOTE — Telephone Encounter (Signed)
Received call today from pharmacy stating they have not been able to get in touch with patient for refills. Is scheduled for an appointment with [place] on 7/22. Will need to contact pharmacy in order to avoid delays in medication.  Alan Rangel, [place]

## 2019-12-01 ENCOUNTER — Telehealth: Payer: Self-pay

## 2019-12-01 NOTE — Telephone Encounter (Signed)
Called patient to inform him medication is at the office for him to pick up. Patient states he has at least 1.5-2 weeks of medication on hand still. Denies missing any doses.  Would like to reschedule follow up appointment since he has counseling appointment same time/date. Is rescheduled for labs this week. Will follow up in August with FNP. Will leave medication in Triage. Lorenso Courier, [place]

## 2019-12-03 ENCOUNTER — Ambulatory Visit: Payer: Medicare HMO | Admitting: Family

## 2019-12-04 ENCOUNTER — Other Ambulatory Visit: Payer: Self-pay

## 2019-12-04 ENCOUNTER — Other Ambulatory Visit: Payer: Medicare HMO

## 2019-12-04 DIAGNOSIS — Z113 Encounter for screening for infections with a predominantly sexual mode of transmission: Secondary | ICD-10-CM

## 2019-12-04 DIAGNOSIS — B20 Human immunodeficiency virus [HIV] disease: Secondary | ICD-10-CM

## 2019-12-04 LAB — T-HELPER CELL (CD4) - (RCID CLINIC ONLY)
CD4 % Helper T Cell: 46 % (ref 33–65)
CD4 T Cell Abs: 1534 /uL (ref 400–1790)

## 2019-12-10 LAB — RPR: RPR Ser Ql: NONREACTIVE

## 2019-12-10 LAB — CBC
HCT: 46.8 % (ref 38.5–50.0)
Hemoglobin: 16 g/dL (ref 13.2–17.1)
MCH: 31.9 pg (ref 27.0–33.0)
MCHC: 34.2 g/dL (ref 32.0–36.0)
MCV: 93.2 fL (ref 80.0–100.0)
MPV: 10 fL (ref 7.5–12.5)
Platelets: 230 10*3/uL (ref 140–400)
RBC: 5.02 10*6/uL (ref 4.20–5.80)
RDW: 12.3 % (ref 11.0–15.0)
WBC: 7.4 10*3/uL (ref 3.8–10.8)

## 2019-12-10 LAB — COMPREHENSIVE METABOLIC PANEL
AG Ratio: 2 (calc) (ref 1.0–2.5)
ALT: 47 U/L — ABNORMAL HIGH (ref 9–46)
AST: 22 U/L (ref 10–40)
Albumin: 4.5 g/dL (ref 3.6–5.1)
Alkaline phosphatase (APISO): 97 U/L (ref 36–130)
BUN/Creatinine Ratio: 7 (calc) (ref 6–22)
BUN: 9 mg/dL (ref 7–25)
CO2: 31 mmol/L (ref 20–32)
Calcium: 9.4 mg/dL (ref 8.6–10.3)
Chloride: 104 mmol/L (ref 98–110)
Creat: 1.36 mg/dL — ABNORMAL HIGH (ref 0.60–1.35)
Globulin: 2.3 g/dL (calc) (ref 1.9–3.7)
Glucose, Bld: 102 mg/dL — ABNORMAL HIGH (ref 65–99)
Potassium: 4.1 mmol/L (ref 3.5–5.3)
Sodium: 142 mmol/L (ref 135–146)
Total Bilirubin: 0.4 mg/dL (ref 0.2–1.2)
Total Protein: 6.8 g/dL (ref 6.1–8.1)

## 2019-12-10 LAB — HIV-1 RNA QUANT-NO REFLEX-BLD
HIV 1 RNA Quant: <20 copies/mL
HIV-1 RNA Quant, Log: <1.3 Log copies/mL

## 2019-12-23 ENCOUNTER — Encounter: Payer: Self-pay | Admitting: Family

## 2019-12-23 ENCOUNTER — Telehealth (INDEPENDENT_AMBULATORY_CARE_PROVIDER_SITE_OTHER): Payer: Medicare HMO | Admitting: Family

## 2019-12-23 ENCOUNTER — Other Ambulatory Visit: Payer: Self-pay

## 2019-12-23 ENCOUNTER — Other Ambulatory Visit: Payer: Self-pay | Admitting: Family

## 2019-12-23 DIAGNOSIS — Z113 Encounter for screening for infections with a predominantly sexual mode of transmission: Secondary | ICD-10-CM | POA: Diagnosis not present

## 2019-12-23 DIAGNOSIS — F3341 Major depressive disorder, recurrent, in partial remission: Secondary | ICD-10-CM | POA: Diagnosis not present

## 2019-12-23 DIAGNOSIS — B20 Human immunodeficiency virus [HIV] disease: Secondary | ICD-10-CM

## 2019-12-23 DIAGNOSIS — Z0000 Encounter for general adult medical examination without abnormal findings: Secondary | ICD-10-CM

## 2019-12-23 DIAGNOSIS — Z79899 Other long term (current) drug therapy: Secondary | ICD-10-CM | POA: Diagnosis not present

## 2019-12-23 MED ORDER — TIVICAY 50 MG PO TABS: ORAL_TABLET | ORAL | 4 refills | Status: DC

## 2019-12-23 MED ORDER — DESCOVY 200-25 MG PO TABS: 1.0000 | ORAL_TABLET | Freq: Every day | ORAL | 4 refills | Status: DC

## 2019-12-23 NOTE — Assessment & Plan Note (Signed)
   Due for second dose of Menveo and routine dental care.  Discussed importance of safe sexual practice to reduce risk of STI.  Condoms declined.  Covid vaccination up-to-date per recommendation.

## 2019-12-23 NOTE — Patient Instructions (Signed)
Nice to speak with you.  Continue to take your Tivicay and Descovy daily as prescribed.  Refills of been sent to the pharmacy.  Plan for follow-up in 4 months or sooner if needed with lab work 1 to 2 weeks prior to appointment.  Have a great day and stay safe!

## 2019-12-23 NOTE — Assessment & Plan Note (Signed)
Appears to recently have had a bout of depression secondary to stress.  No suicidal ideations or signs of psychosis.  He is no longer taking risperidone.  Indicates he is in a good spot and feeling better.  Continue to monitor.

## 2019-12-23 NOTE — Progress Notes (Signed)
Subjective:    Patient ID: Alan Rangel, male    DOB: 19-Feb-1976, 44 y.o.   MRN: [id number]  Chief Complaint  Patient presents with  . Follow-up    no complaints     Virtual Visit via Telephone/Video Note   I connected with [name] on 12/23/2019 at 1450  by video visit and verified that I am speaking with the correct person using two identifiers.   I discussed the limitations, risks, security and privacy concerns of performing an evaluation and management service by telephone and the availability of in person appointments. I also discussed with the patient that there may be a patient responsible charge related to this service. The patient expressed understanding and agreed to proceed.  Location:  Alan Rangel was located near his home in [facility] [place] and I was located in the regional center for infectious disease clinic.  HPI:  Alan Rangel is a 44 y.o. male with HIV disease last seen in the office on 06/08/2019 with good adherence and tolerance to his ART regimen of Tivicay and Descovy..  Viral load at the time was undetectable with CD4 count of 2245.  Most recent blood work completed on 12/04/2019 with viral load that remains undetectable and CD4 count of 1534.  RPR nonreactive for syphilis.  Kidney function, liver function, electrolytes within normal ranges.  Here today for routine follow-up.  Alan Rangel continues to take his Tivicay and Descovy daily as prescribed with no adverse side effects.  There was a period of time he stopped taking medication secondary to depression and being overwhelmed.  That has since been resolved.  Feeling well today with no new concerns/complaints. Denies fevers, chills, night sweats, headaches, changes in vision, neck pain/stiffness, nausea, diarrhea, vomiting, lesions or rashes.  Alan Rangel has no problem obtaining his medication from the pharmacy and remains covered through [facility].  Denies feelings of being down, depressed, or  hopeless since overcoming previous feelings of depression.  No recreational or illicit drug use or alcohol consumption.  Currently smokes approximately 1/2 pack of cigarettes per day on average.  Covid vaccination received.  Dental visit needed.  Healthcare maintenance due includes second dose of Menveo.   No Known Allergies    Outpatient Medications Prior to Visit  Medication Sig Dispense Refill  . albuterol (VENTOLIN HFA) 108 (90 Base) MCG/ACT inhaler Inhale 1-2 puffs into the lungs every 6 (six) hours as needed for wheezing or shortness of breath. 18 g 1  . amLODipine (NORVASC) 2.5 MG tablet Take 2.5 mg by mouth daily.    Marland Kitchen atorvastatin (LIPITOR) 10 MG tablet     . buPROPion (WELLBUTRIN XL) 150 MG 24 hr tablet Take 150 mg by mouth daily.    . clonazePAM (KLONOPIN) 0.5 MG tablet Take 1 tablet (0.5 mg total) by mouth 2 (two) times daily as needed (Anxiety). 10 tablet 0  . lamoTRIgine (LAMICTAL) 100 MG tablet Take 1 tablet (100 mg total) by mouth daily. For mood control 30 tablet 0  . Multiple Vitamin (MULTIVITAMIN WITH MINERALS) TABS tablet Take 1 tablet by mouth daily.    Marland Kitchen triamcinolone ointment (KENALOG) 0.5 % Apply 1 application topically 2 (two) times daily. 30 g 0  . DESCOVY 200-25 MG tablet TAKE 1 TABLET BY MOUTH DAILY 30 tablet 0  . TIVICAY 50 MG tablet TAKE 1 TABLET(50 MG) BY MOUTH DAILY 30 tablet 0  . benztropine (COGENTIN) 0.5 MG tablet Take by mouth. (Patient not taking: Reported on 12/23/2019)    .  OZEMPIC, 0.25 OR 0.5 MG/DOSE, 2 MG/1.5ML SOPN     . risperiDONE (RISPERDAL) 1 MG tablet TAKE 1/2 TABLET EVERY MORNING AND 1 TABLET AT BEDTIME (Patient not taking: Reported on 12/23/2019)    . guaiFENesin (MUCINEX) 600 MG 12 hr tablet Take 600 mg by mouth. (Patient not taking: Reported on 12/23/2019)     No facility-administered medications prior to visit.     Past Medical History:  Diagnosis Date  . Depression   . Episodic mood disorder (HCC)   . Generalized anxiety disorder   .  HIV (human immunodeficiency virus infection) (HCC)   . HTN (hypertension)   . Panic attack   . Paranoid schizophrenia (HCC)   . Personality disorder (HCC)   . Schizoaffective disorder, bipolar type [facility])      Past Surgical History:  Procedure Laterality Date  . SKIN GRAFT Right    foot/leg     Review of Systems  Constitutional: Negative for appetite change, chills, fatigue, fever and unexpected weight change.  Eyes: Negative for visual disturbance.  Respiratory: Negative for cough, chest tightness, shortness of breath and wheezing.   Cardiovascular: Negative for chest pain and leg swelling.  Gastrointestinal: Negative for abdominal pain, constipation, diarrhea, nausea and vomiting.  Genitourinary: Negative for dysuria, flank pain, frequency, genital sores, hematuria and urgency.  Skin: Negative for rash.  Allergic/Immunologic: Negative for immunocompromised state.  Neurological: Negative for dizziness and headaches.      Objective:    Nursing note and vital signs reviewed.    Alan Rangel is pleasant to speak with and appears to be doing well.  Physical exam limited secondary to telemedicine visit. Assessment & Plan:   Problem List Items Addressed This Visit      Other   HIV INFECTION - Primary    Alan Rangel is a 44 year old male with HIV-1 disease with risk factor for acquiring HIV including MSM who continues to have well-controlled HIV disease with good adherence and tolerance to his ART regimen of Tivicay and Descovy.  We reviewed lab work and discussed the plan of care.  No signs/symptoms of opportunistic infection or progressive HIV disease.  Continue current dose of Tivicay and Descovy.  Plan for follow-up in 4 months or sooner if needed with lab work 1 to 2 weeks prior to appointment.      Relevant Medications   emtricitabine-tenofovir AF (DESCOVY) 200-25 MG tablet   dolutegravir (TIVICAY) 50 MG tablet   Other Relevant Orders   HIV-1 RNA quant-no reflex-bld    T-helper cell (CD4)- ([facility] clinic only)   Comprehensive metabolic panel   CBC   Depression    Appears to recently have had a bout of depression secondary to stress.  No suicidal ideations or signs of psychosis.  He is no longer taking risperidone.  Indicates he is in a good spot and feeling better.  Continue to monitor.      Healthcare maintenance     Due for second dose of Menveo and routine dental care.  Discussed importance of safe sexual practice to reduce risk of STI.  Condoms declined.  Covid vaccination up-to-date per recommendation.       Other Visit Diagnoses    Screening for STDs (sexually transmitted diseases)       Relevant Orders   RPR   Urine cytology ancillary only([place])   Pharmacologic therapy       Relevant Orders   Lipid panel       I have discontinued Mamoudou Engelbrecht's guaiFENesin. I  have also changed his Descovy and Tivicay. Additionally, I am having him maintain his amLODipine, multivitamin with minerals, clonazePAM, lamoTRIgine, buPROPion, risperiDONE, benztropine, triamcinolone ointment, albuterol, Ozempic (0.25 or 0.5 MG/DOSE), and atorvastatin.   Meds ordered this encounter  Medications  . emtricitabine-tenofovir AF (DESCOVY) 200-25 MG tablet    Sig: Take 1 tablet by mouth daily.    Dispense:  30 tablet    Refill:  4    Order Specific Question:   Supervising Provider    Answer:   Judyann Munson [[id number]]  . dolutegravir (TIVICAY) 50 MG tablet    Sig: TAKE 1 TABLET(50 MG) BY MOUTH DAILY    Dispense:  30 tablet    Refill:  4    Order Specific Question:   Supervising Provider    Answer:   Judyann Munson [id number]    I discussed the assessment and treatment plan with the patient. The patient was provided an opportunity to ask questions and all were answered. The patient agreed with the plan and demonstrated an understanding of the instructions.   The patient was advised to call back or seek an in-person evaluation if the symptoms worsen or if the  condition fails to improve as anticipated.   I provided 15  minutes of non-face-to-face time during this encounter.  Follow-up: Return in about 4 months (around 04/23/2020), or if symptoms worsen or fail to improve.   Marcos Eke, MSN, FNP-C Nurse Practitioner [facility] for Infectious Disease [facility] Main number: [phone]

## 2019-12-23 NOTE — Assessment & Plan Note (Signed)
Mr. Gell is a 45 year old male with HIV-1 disease with risk factor for acquiring HIV including MSM who continues to have well-controlled HIV disease with good adherence and tolerance to his ART regimen of Tivicay and Descovy.  We reviewed lab work and discussed the plan of care.  No signs/symptoms of opportunistic infection or progressive HIV disease.  Continue current dose of Tivicay and Descovy.  Plan for follow-up in 4 months or sooner if needed with lab work 1 to 2 weeks prior to appointment.

## 2019-12-30 ENCOUNTER — Other Ambulatory Visit: Payer: Self-pay | Admitting: *Deleted

## 2019-12-30 NOTE — Telephone Encounter (Signed)
RN left voicemail notifying patient that his medications were delivered to the clinic and are ready for pick up.  He can speak with Triage when he calls/comes in. Andree Coss, RN

## 2020-01-06 NOTE — Telephone Encounter (Signed)
Patient will come 01/11/20 to pick up his medication from Triage.

## 2020-02-04 ENCOUNTER — Telehealth: Payer: Self-pay | Admitting: *Deleted

## 2020-02-04 NOTE — Telephone Encounter (Signed)
RN called patient to let him know [facility] mailed his medication to the clinic. He has 2 months worth ready for pick up. Andree Coss, RN

## 2020-03-16 DIAGNOSIS — M5136 Other intervertebral disc degeneration, lumbar region: Secondary | ICD-10-CM | POA: Insufficient documentation

## 2020-03-16 DIAGNOSIS — M51369 Other intervertebral disc degeneration, lumbar region without mention of lumbar back pain or lower extremity pain: Secondary | ICD-10-CM | POA: Insufficient documentation

## 2020-04-13 ENCOUNTER — Ambulatory Visit (INDEPENDENT_AMBULATORY_CARE_PROVIDER_SITE_OTHER): Payer: Medicare HMO | Admitting: Pharmacist

## 2020-04-13 ENCOUNTER — Other Ambulatory Visit: Payer: Self-pay

## 2020-04-13 DIAGNOSIS — Z79899 Other long term (current) drug therapy: Secondary | ICD-10-CM | POA: Diagnosis not present

## 2020-04-13 DIAGNOSIS — Z113 Encounter for screening for infections with a predominantly sexual mode of transmission: Secondary | ICD-10-CM

## 2020-04-13 DIAGNOSIS — B20 Human immunodeficiency virus [HIV] disease: Secondary | ICD-10-CM | POA: Diagnosis not present
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
## 2020-04-14 LAB — T-HELPER CELL (CD4) - (RCID CLINIC ONLY)
CD4 % Helper T Cell: 47 % (ref 33–65)
CD4 T Cell Abs: 1638 /uL (ref 400–1790)

## 2020-04-15 LAB — CBC
HCT: 45.7 % (ref 38.5–50.0)
Hemoglobin: 15.7 g/dL (ref 13.2–17.1)
MCH: 32.2 pg (ref 27.0–33.0)
MCHC: 34.4 g/dL (ref 32.0–36.0)
MCV: 93.8 fL (ref 80.0–100.0)
MPV: 9.7 fL (ref 7.5–12.5)
Platelets: 204 10*3/uL (ref 140–400)
RBC: 4.87 10*6/uL (ref 4.20–5.80)
RDW: 12.1 % (ref 11.0–15.0)
WBC: 7.2 10*3/uL (ref 3.8–10.8)

## 2020-04-15 LAB — COMPREHENSIVE METABOLIC PANEL
AG Ratio: 2.2 (calc) (ref 1.0–2.5)
ALT: 41 U/L (ref 9–46)
AST: 17 U/L (ref 10–40)
Albumin: 4.3 g/dL (ref 3.6–5.1)
Alkaline phosphatase (APISO): 107 U/L (ref 36–130)
BUN: 9 mg/dL (ref 7–25)
CO2: 30 mmol/L (ref 20–32)
Calcium: 9.1 mg/dL (ref 8.6–10.3)
Chloride: 106 mmol/L (ref 98–110)
Creat: 1.15 mg/dL (ref 0.60–1.35)
Globulin: 2 g/dL (calc) (ref 1.9–3.7)
Glucose, Bld: 101 mg/dL — ABNORMAL HIGH (ref 65–99)
Potassium: 4.3 mmol/L (ref 3.5–5.3)
Sodium: 142 mmol/L (ref 135–146)
Total Bilirubin: 0.4 mg/dL (ref 0.2–1.2)
Total Protein: 6.3 g/dL (ref 6.1–8.1)

## 2020-04-15 LAB — HIV-1 RNA QUANT-NO REFLEX-BLD
HIV 1 RNA Quant: <20 Copies/mL
HIV-1 RNA Quant, Log: <1.3 Log cps/mL

## 2020-04-15 LAB — LIPID PANEL
Cholesterol: 142 mg/dL (ref <200)
HDL: 30 mg/dL — ABNORMAL LOW (ref >=40)
LDL Cholesterol (Calc): 97 mg/dL (calc)
Non-HDL Cholesterol (Calc): 112 mg/dL (calc) (ref <130)
Total CHOL/HDL Ratio: 4.7 (calc) (ref <5.0)
Triglycerides: 67 mg/dL (ref <150)

## 2020-04-15 LAB — RPR: RPR Ser Ql: NONREACTIVE

## 2020-05-10 ENCOUNTER — Ambulatory Visit: Payer: Medicare HMO | Admitting: Family

## 2020-05-19 ENCOUNTER — Telehealth: Payer: Self-pay | Admitting: *Deleted

## 2020-05-19 NOTE — Telephone Encounter (Signed)
RN called patient to let him know his medication was delivered to [facility]. He will come 1/10 to pick this up. Medication in triage. Andree Coss, RN

## 2020-06-06 NOTE — Telephone Encounter (Signed)
Reminder call sent to patient to make him aware that his medication remains at the office. Patient states he still had medication on hand and will try to get to the office this week to pick up. Valarie Cones

## 2020-07-12 ENCOUNTER — Telehealth: Payer: Self-pay

## 2020-07-12 NOTE — Telephone Encounter (Signed)
Called patient to update him that medication is ready to pick up at office. States he can come by tomorrow. Rescheduled missed appointment with Carver Fila, FNP for later this month.

## 2020-08-03 ENCOUNTER — Ambulatory Visit: Payer: Medicare HMO | Admitting: Family

## 2020-08-09 ENCOUNTER — Other Ambulatory Visit: Payer: Self-pay | Admitting: Family

## 2020-08-30 ENCOUNTER — Telehealth: Payer: Self-pay

## 2020-08-30 NOTE — Telephone Encounter (Addendum)
Attempted to call patient to let him know that his mediations were delivered to the office and ready to be picked up, no answer. Left HIPAA compliant voicemail requesting callback. Medications in triage.   Sandie Ano, RN

## 2020-09-06 ENCOUNTER — Other Ambulatory Visit: Payer: Self-pay

## 2020-09-06 ENCOUNTER — Ambulatory Visit (INDEPENDENT_AMBULATORY_CARE_PROVIDER_SITE_OTHER): Payer: Medicare HMO | Admitting: Family

## 2020-09-06 ENCOUNTER — Encounter: Payer: Self-pay | Admitting: Family

## 2020-09-06 VITALS — BP 136/91 | HR 76 | Temp 98.2°F | Wt 334.0 lb

## 2020-09-06 DIAGNOSIS — Z0000 Encounter for general adult medical examination without abnormal findings: Secondary | ICD-10-CM | POA: Diagnosis not present

## 2020-09-06 DIAGNOSIS — B20 Human immunodeficiency virus [HIV] disease: Secondary | ICD-10-CM

## 2020-09-06 DIAGNOSIS — Z113 Encounter for screening for infections with a predominantly sexual mode of transmission: Secondary | ICD-10-CM | POA: Diagnosis not present

## 2020-09-06 MED ORDER — DESCOVY 200-25 MG PO TABS: 1.0000 | ORAL_TABLET | Freq: Every day | ORAL | 5 refills | Status: DC

## 2020-09-06 MED ORDER — TIVICAY 50 MG PO TABS: ORAL_TABLET | ORAL | 5 refills | Status: DC
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
## 2020-09-06 NOTE — Patient Instructions (Signed)
Nice to see you.  Continue to take your Tivicay and Descovy.   Refills have been sent to the pharmacy.   Plan for follow up in 4 months or sooner if needed with lab work on the same day.  Have a great day and stay safe!

## 2020-09-06 NOTE — Assessment & Plan Note (Signed)
   Discussed importance of safe sexual practice to reduce risk of STI.  Condoms provided.  Declines vaccines   Discussed/recommended Covid booster.   Working on quitting smoking.

## 2020-09-06 NOTE — Assessment & Plan Note (Signed)
Alan Rangel continues to have well-controlled HIV disease with good adherence and tolerance to his ART regimen of Tivicay and Descovy.  Reviewed previous lab work and discussed plan of care.  No signs/symptoms of opportunistic infection or progressive HIV.  Check lab work today.  Continue current dose of Tivicay and Descovy.  Plan for follow-up in 4 months or sooner if needed with lab work on the same day.

## 2020-09-07 LAB — T-HELPER CELL (CD4) - (RCID CLINIC ONLY)
CD4 % Helper T Cell: 47 % (ref 33–65)
CD4 T Cell Abs: 1746 /uL (ref 400–1790)

## 2020-09-08 LAB — HIV-1 RNA QUANT-NO REFLEX-BLD
HIV 1 RNA Quant: NOT DETECTED Copies/mL
HIV-1 RNA Quant, Log: NOT DETECTED Log cps/mL

## 2020-09-08 LAB — RPR: RPR Ser Ql: NONREACTIVE

## 2020-10-12 ENCOUNTER — Telehealth: Payer: Self-pay

## 2020-10-12 NOTE — Telephone Encounter (Signed)
Called patient to let him know that his medications were delivered to the clinic, no answer. Will send follow up MyChart message.   Sandie Ano, RN

## 2020-10-31 ENCOUNTER — Telehealth: Payer: Self-pay | Admitting: *Deleted

## 2020-10-31 NOTE — Telephone Encounter (Signed)
Called patient to remind him that his medication was delivered to [facility] by [facility]. Left message asking him to come by clinic to pick it up. His June delivery should be coming to [facility] soon as well, advised him to pick up both at once. Medication is in Triage. Andree Coss, RN

## 2020-11-09 ENCOUNTER — Other Ambulatory Visit: Payer: Self-pay | Admitting: Family

## 2020-11-09 DIAGNOSIS — B20 Human immunodeficiency virus [HIV] disease: Secondary | ICD-10-CM

## 2020-11-23 ENCOUNTER — Other Ambulatory Visit: Payer: Self-pay

## 2020-11-23 ENCOUNTER — Emergency Department (HOSPITAL_COMMUNITY)
Admission: EM | Admit: 2020-11-23 | Discharge: 2020-11-24 | Disposition: A | Discharge status: Home / Self Care | Payer: Medicare HMO | Attending: Emergency Medicine | Admitting: Emergency Medicine

## 2020-11-23 ENCOUNTER — Encounter (HOSPITAL_COMMUNITY): Payer: Self-pay

## 2020-11-23 DIAGNOSIS — R440 Auditory hallucinations: Secondary | ICD-10-CM

## 2020-11-23 DIAGNOSIS — F200 Paranoid schizophrenia: Secondary | ICD-10-CM | POA: Insufficient documentation

## 2020-11-23 DIAGNOSIS — F17210 Nicotine dependence, cigarettes, uncomplicated: Secondary | ICD-10-CM | POA: Diagnosis not present

## 2020-11-23 DIAGNOSIS — F32A Depression, unspecified: Secondary | ICD-10-CM | POA: Diagnosis present

## 2020-11-23 DIAGNOSIS — Z20822 Contact with and (suspected) exposure to covid-19: Secondary | ICD-10-CM | POA: Insufficient documentation

## 2020-11-23 DIAGNOSIS — I10 Essential (primary) hypertension: Secondary | ICD-10-CM | POA: Diagnosis not present

## 2020-11-23 DIAGNOSIS — Z21 Asymptomatic human immunodeficiency virus [HIV] infection status: Secondary | ICD-10-CM | POA: Insufficient documentation

## 2020-11-23 DIAGNOSIS — F329 Major depressive disorder, single episode, unspecified: Secondary | ICD-10-CM | POA: Insufficient documentation

## 2020-11-23 DIAGNOSIS — R45851 Suicidal ideations: Secondary | ICD-10-CM | POA: Diagnosis not present

## 2020-11-23 LAB — COMPREHENSIVE METABOLIC PANEL
ALT: 31 U/L (ref 0–44)
AST: 21 U/L (ref 15–41)
Albumin: 4.2 g/dL (ref 3.5–5.0)
Alkaline Phosphatase: 97 U/L (ref 38–126)
Anion gap: 6 (ref 5–15)
BUN: 6 mg/dL (ref 6–20)
CO2: 29 mmol/L (ref 22–32)
Calcium: 9.3 mg/dL (ref 8.9–10.3)
Chloride: 104 mmol/L (ref 98–111)
Creatinine, Ser: 1.32 mg/dL — ABNORMAL HIGH (ref 0.61–1.24)
GFR, Estimated: >60 mL/min (ref >60)
Glucose, Bld: 111 mg/dL — ABNORMAL HIGH (ref 70–99)
Potassium: 3.8 mmol/L (ref 3.5–5.1)
Sodium: 139 mmol/L (ref 135–145)
Total Bilirubin: 0.5 mg/dL (ref 0.3–1.2)
Total Protein: 7 g/dL (ref 6.5–8.1)

## 2020-11-23 LAB — SALICYLATE LEVEL: Salicylate Lvl: <7 mg/dL — ABNORMAL LOW (ref 7.0–30.0)

## 2020-11-23 LAB — CBC
HCT: 49.2 % (ref 39.0–52.0)
Hemoglobin: 16.7 g/dL (ref 13.0–17.0)
MCH: 32.1 pg (ref 26.0–34.0)
MCHC: 33.9 g/dL (ref 30.0–36.0)
MCV: 94.6 fL (ref 80.0–100.0)
Platelets: 231 10*3/uL (ref 150–400)
RBC: 5.2 MIL/uL (ref 4.22–5.81)
RDW: 12.8 % (ref 11.5–15.5)
WBC: 10.1 10*3/uL (ref 4.0–10.5)
nRBC: 0 % (ref 0.0–0.2)

## 2020-11-23 LAB — RESP PANEL BY RT-PCR (FLU A&B, COVID) ARPGX2
Influenza A by PCR: NEGATIVE
Influenza B by PCR: NEGATIVE
SARS Coronavirus 2 by RT PCR: NEGATIVE

## 2020-11-23 LAB — ACETAMINOPHEN LEVEL: Acetaminophen (Tylenol), Serum: <10 ug/mL — ABNORMAL LOW (ref 10–30)

## 2020-11-23 LAB — ETHANOL: Alcohol, Ethyl (B): <10 mg/dL (ref <10)

## 2020-11-23 MED ORDER — EMTRICITABINE-TENOFOVIR AF 200-25 MG PO TABS
1.0000 | ORAL_TABLET | Freq: Every day | ORAL | Status: DC
  Administered 2020-11-23 – 2020-11-24 (×2): 1 via ORAL
  Filled 2020-11-23 (×2): qty 1

## 2020-11-23 MED ORDER — DOLUTEGRAVIR SODIUM 50 MG PO TABS
50.0000 mg | ORAL_TABLET | Freq: Every day | ORAL | Status: DC
  Administered 2020-11-23 – 2020-11-24 (×2): 50 mg via ORAL
  Filled 2020-11-23 (×2): qty 1

## 2020-11-23 MED ORDER — SERTRALINE HCL 100 MG PO TABS
100.0000 mg | ORAL_TABLET | Freq: Every day | ORAL | Status: DC
  Administered 2020-11-23 – 2020-11-24 (×2): 100 mg via ORAL
  Filled 2020-11-23 (×2): qty 1

## 2020-11-23 MED ORDER — LORAZEPAM 1 MG PO TABS
1.0000 mg | ORAL_TABLET | Freq: Once | ORAL | Status: AC
  Administered 2020-11-23: 1 mg via ORAL
  Filled 2020-11-23: qty 1
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
## 2020-11-23 NOTE — BH Assessment (Addendum)
Disposition:   Nira Conn, NP, patient meets inpatient criteria. Hassie Bruce, notified @ [phone] to review for [facility].
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
## 2020-11-23 NOTE — BH Assessment (Addendum)
Disposition:   Patient has been accepted to Room #503-2 by [facility] provider, Melbourne Abts, PA. The attending provider is Dr. Mason Jim. Patient to arrive at 0900 in the morning. Patient's nurse, Adonis Brook, RN, provided disposition updates via secure chat.   Patient is voluntary.Request patient's nurse to place have patient sign voluntary admissions form and fax document to [phone], prior to patient transfer.

## 2020-11-23 NOTE — ED Triage Notes (Signed)
Pt called Child psychotherapist for thoughts of SI for the past week. Pt states earlier this morning he tried walking into oncoming traffic and Child psychotherapist referred him here. Pt reports auditory hallucinations with commands. Hx of MDD, schizophrenia, schizoaffective.

## 2020-11-23 NOTE — ED Notes (Signed)
Pt belongings inventoried and placed in purple zone locker #7. Valuables locked away with security. Pt wanded by security. Pt resting comfortably at this time.

## 2020-11-24 ENCOUNTER — Inpatient Hospital Stay (HOSPITAL_COMMUNITY)
Admission: AD | Admit: 2020-11-24 | Discharge: 2020-12-06 | DRG: 885 | Disposition: A | Discharge status: Home / Self Care | Payer: Medicare HMO | Source: Intra-hospital | Attending: Psychiatry | Admitting: Psychiatry

## 2020-11-24 ENCOUNTER — Other Ambulatory Visit: Payer: Self-pay

## 2020-11-24 ENCOUNTER — Encounter (HOSPITAL_COMMUNITY): Payer: Self-pay | Admitting: Emergency Medicine

## 2020-11-24 DIAGNOSIS — G8929 Other chronic pain: Secondary | ICD-10-CM | POA: Diagnosis present

## 2020-11-24 DIAGNOSIS — Z79899 Other long term (current) drug therapy: Secondary | ICD-10-CM

## 2020-11-24 DIAGNOSIS — F329 Major depressive disorder, single episode, unspecified: Secondary | ICD-10-CM | POA: Diagnosis not present

## 2020-11-24 DIAGNOSIS — R45851 Suicidal ideations: Secondary | ICD-10-CM | POA: Diagnosis present

## 2020-11-24 DIAGNOSIS — B20 Human immunodeficiency virus [HIV] disease: Secondary | ICD-10-CM | POA: Diagnosis present

## 2020-11-24 DIAGNOSIS — R609 Edema, unspecified: Secondary | ICD-10-CM | POA: Diagnosis not present

## 2020-11-24 DIAGNOSIS — F17210 Nicotine dependence, cigarettes, uncomplicated: Secondary | ICD-10-CM | POA: Diagnosis present

## 2020-11-24 DIAGNOSIS — Z20822 Contact with and (suspected) exposure to covid-19: Secondary | ICD-10-CM | POA: Diagnosis present

## 2020-11-24 DIAGNOSIS — I10 Essential (primary) hypertension: Secondary | ICD-10-CM | POA: Diagnosis present

## 2020-11-24 DIAGNOSIS — F333 Major depressive disorder, recurrent, severe with psychotic symptoms: Secondary | ICD-10-CM | POA: Diagnosis not present

## 2020-11-24 DIAGNOSIS — Z833 Family history of diabetes mellitus: Secondary | ICD-10-CM

## 2020-11-24 DIAGNOSIS — G4700 Insomnia, unspecified: Secondary | ICD-10-CM | POA: Diagnosis present

## 2020-11-24 DIAGNOSIS — F259 Schizoaffective disorder, unspecified: Secondary | ICD-10-CM | POA: Diagnosis present

## 2020-11-24 DIAGNOSIS — F258 Other schizoaffective disorders: Secondary | ICD-10-CM | POA: Diagnosis present

## 2020-11-24 DIAGNOSIS — F4310 Post-traumatic stress disorder, unspecified: Secondary | ICD-10-CM | POA: Diagnosis present

## 2020-11-24 DIAGNOSIS — E119 Type 2 diabetes mellitus without complications: Secondary | ICD-10-CM | POA: Diagnosis present

## 2020-11-24 DIAGNOSIS — R2241 Localized swelling, mass and lump, right lower limb: Secondary | ICD-10-CM

## 2020-11-24 DIAGNOSIS — M7989 Other specified soft tissue disorders: Secondary | ICD-10-CM | POA: Diagnosis present

## 2020-11-24 DIAGNOSIS — R600 Localized edema: Secondary | ICD-10-CM | POA: Diagnosis not present

## 2020-11-24 DIAGNOSIS — F250 Schizoaffective disorder, bipolar type: Secondary | ICD-10-CM

## 2020-11-24 LAB — RAPID URINE DRUG SCREEN, HOSP PERFORMED
Amphetamines: NOT DETECTED
Barbiturates: NOT DETECTED
Benzodiazepines: DETECTED — AB
Cocaine: NOT DETECTED
Opiates: NOT DETECTED
Tetrahydrocannabinol: DETECTED — AB

## 2020-11-24 MED ORDER — OXYCODONE-ACETAMINOPHEN 5-325 MG PO TABS: 1.0000 | ORAL_TABLET | Freq: Four times a day (QID) | ORAL | Status: DC | PRN

## 2020-11-24 MED ORDER — TRAZODONE HCL 50 MG PO TABS
50.0000 mg | ORAL_TABLET | Freq: Every evening | ORAL | Status: DC | PRN
  Administered 2020-11-26 – 2020-11-28 (×3): 50 mg via ORAL
  Filled 2020-11-24 (×4): qty 1

## 2020-11-24 MED ORDER — PAROXETINE HCL 10 MG PO TABS
5.0000 mg | ORAL_TABLET | Freq: Every day | ORAL | Status: DC
  Administered 2020-11-24 – 2020-11-25 (×2): 5 mg via ORAL
  Filled 2020-11-24 (×4): qty 0.5

## 2020-11-24 MED ORDER — ALUM & MAG HYDROXIDE-SIMETH 200-200-20 MG/5ML PO SUSP
30.0000 mL | ORAL | Status: DC | PRN
  Administered 2020-11-28: 30 mL via ORAL
  Filled 2020-11-24: qty 30

## 2020-11-24 MED ORDER — EMTRICITABINE-TENOFOVIR AF 200-25 MG PO TABS
1.0000 | ORAL_TABLET | Freq: Every day | ORAL | Status: DC
  Administered 2020-11-25 – 2020-12-06 (×12): 1 via ORAL
  Filled 2020-11-24 (×14): qty 1

## 2020-11-24 MED ORDER — ZIPRASIDONE HCL 40 MG PO CAPS
40.0000 mg | ORAL_CAPSULE | Freq: Every day | ORAL | Status: DC
  Administered 2020-11-24 – 2020-11-25 (×2): 40 mg via ORAL
  Filled 2020-11-24 (×3): qty 1

## 2020-11-24 MED ORDER — AMLODIPINE BESYLATE 10 MG PO TABS
10.0000 mg | ORAL_TABLET | Freq: Every day | ORAL | Status: DC
  Administered 2020-11-24 – 2020-11-30 (×7): 10 mg via ORAL
  Filled 2020-11-24 (×10): qty 1

## 2020-11-24 MED ORDER — NICOTINE 14 MG/24HR TD PT24
14.0000 mg | MEDICATED_PATCH | Freq: Every day | TRANSDERMAL | Status: DC
  Administered 2020-11-24 – 2020-12-06 (×13): 14 mg via TRANSDERMAL
  Filled 2020-11-24 (×17): qty 1

## 2020-11-24 MED ORDER — HYDROXYZINE HCL 25 MG PO TABS
25.0000 mg | ORAL_TABLET | Freq: Three times a day (TID) | ORAL | Status: DC | PRN
  Administered 2020-11-30: 25 mg via ORAL
  Filled 2020-11-24 (×2): qty 1

## 2020-11-24 MED ORDER — ACETAMINOPHEN 325 MG PO TABS
650.0000 mg | ORAL_TABLET | Freq: Four times a day (QID) | ORAL | Status: DC | PRN
  Administered 2020-11-27 – 2020-11-28 (×2): 650 mg via ORAL
  Filled 2020-11-24 (×2): qty 2

## 2020-11-24 MED ORDER — DOLUTEGRAVIR SODIUM 50 MG PO TABS
50.0000 mg | ORAL_TABLET | Freq: Every day | ORAL | Status: DC
  Administered 2020-11-25 – 2020-12-06 (×12): 50 mg via ORAL
  Filled 2020-11-24 (×16): qty 1

## 2020-11-24 MED ORDER — ZIPRASIDONE HCL 20 MG PO CAPS
20.0000 mg | ORAL_CAPSULE | ORAL | Status: DC
  Filled 2020-11-24: qty 1

## 2020-11-24 MED ORDER — SERTRALINE HCL 100 MG PO TABS
100.0000 mg | ORAL_TABLET | Freq: Every day | ORAL | Status: DC
  Filled 2020-11-24 (×2): qty 1

## 2020-11-24 MED ORDER — ATORVASTATIN CALCIUM 10 MG PO TABS
10.0000 mg | ORAL_TABLET | Freq: Every day | ORAL | Status: DC
  Administered 2020-11-24 – 2020-12-06 (×13): 10 mg via ORAL
  Filled 2020-11-24 (×15): qty 1

## 2020-11-24 MED ORDER — CLONAZEPAM 0.5 MG PO TABS
0.5000 mg | ORAL_TABLET | Freq: Two times a day (BID) | ORAL | Status: DC
  Administered 2020-11-24 – 2020-12-06 (×24): 0.5 mg via ORAL
  Filled 2020-11-24 (×23): qty 1

## 2020-11-24 MED ORDER — MAGNESIUM HYDROXIDE 400 MG/5ML PO SUSP: 30.0000 mL | Freq: Every day | ORAL | Status: DC | PRN
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
## 2020-11-24 NOTE — Progress Notes (Signed)
Patient has been sleeping most of the day.  RN woke patient up to take his medications late afternoon.  Patient denied SI and HI, contracts for safety.  Denied A/V hallucinations.  Continues to rest comfortably in his bed.  Stated he was very tired and needed to sleep.  Dinner tray will be brought to him.  Tivicay and descovy was not given in the adult unit because he received these medications at [facility] this morning.    Respirations even and unlabored.  No signs/symptoms of pain/distress noted on patient's face and body movements. Safety maintained with 15 minute checks.

## 2020-11-24 NOTE — ED Notes (Signed)
Patient c/o of chest tightness radiating to back and jaw; pt states it may be panic attack; EDP notified and Vitals taken WNL; EKG will be given to Dr.Pickering for [facility]
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
## 2020-11-24 NOTE — ED Notes (Signed)
Phone report called to [facility] RN Treinen Ludwig, all questions answered.

## 2020-11-24 NOTE — ED Notes (Signed)
Breakfast Ordered 

## 2020-11-24 NOTE — Progress Notes (Signed)
Pt accepted to [facility] 503-2  Patient meets inpatient criteria per Melbourne Abts, NP  Dr.Niraj is the attending provider.    Call report to [phone]    Eloise Levels, RN@ [facility] ED notified.     Pt scheduled  to arrive at [facility] today @1300   , MSW, LCSW-A  10:28 AM 11/24/2020

## 2020-11-24 NOTE — ED Notes (Signed)
ED Provider at bedside. 

## 2020-11-24 NOTE — [facility] Group Notes (Signed)
[facility] Group Notes:  (Nursing/MHT/Case Management/Adjunct)  Date:  11/24/2020  Time:  8:46 PM  Type of Therapy:  Group Therapy  Participation Level:  Did Not Attend  Participation Quality:   NA  Affect:   NA  Cognitive:   NA  Insight:  None  Engagement in Group:   NA  Modes of Intervention:   NA  Summary of Progress/Problems:  Alan Rangel 11/24/2020, 8:46 PM

## 2020-11-24 NOTE — Tx Team (Signed)
Initial Treatment Plan 11/24/2020 10:45 AM [name] [id number]    PATIENT STRESSORS: Financial difficulties Health problems Medication change or noncompliance   PATIENT STRENGTHS: Ability for insight Average or above average intelligence Capable of independent living Motivation for treatment/growth Supportive family/friends Work skills   PATIENT IDENTIFIED PROBLEMS: anxiety  depression  Medication change  Suicidal ideations               DISCHARGE CRITERIA:  Ability to meet basic life and health needs Improved stabilization in mood, thinking, and/or behavior Motivation to continue treatment in a less acute level of care Need for constant or close observation no longer present  PRELIMINARY DISCHARGE PLAN: Attend aftercare/continuing care group Outpatient therapy Return to previous living arrangement  PATIENT/FAMILY INVOLVEMENT: This treatment plan has been presented to and reviewed with the patient, Alan Rangel.  The patient and family have been given the opportunity to ask questions and make suggestions.  Raylene Miyamoto, RN 11/24/2020, 10:45 AM
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
## 2020-11-24 NOTE — ED Provider Notes (Signed)
  Physical Exam  BP (!) 142/87 (BP Location: Left Arm)   Pulse (!) 57   Temp 98.6 F (37 C) (Oral)   Resp 17   Ht 6\' 1"  (1.854 m)   Wt (!) 147.4 kg   SpO2 99%   BMI 42.88 kg/m   Physical Exam  ED Course/Procedures     Procedures  MDM  Have been called to see patient.  Had been complaining of chest pain.  EKG reviewed and reassuring.  Patient went back to sleep.  States that pain is gone.  Does have some lower back pain.  Has been doing well the last couple days.  No exertional chest pain.  No cardiac history.  Patient is wondering whether it could be a panic attack       , MD 11/24/20 [phone]

## 2020-11-25 LAB — LIPID PANEL
Cholesterol: 153 mg/dL (ref 0–200)
HDL: 30 mg/dL — ABNORMAL LOW (ref >40)
LDL Cholesterol: 103 mg/dL — ABNORMAL HIGH (ref 0–99)
Total CHOL/HDL Ratio: 5.1 RATIO
Triglycerides: 99 mg/dL (ref <150)
VLDL: 20 mg/dL (ref 0–40)

## 2020-11-25 LAB — GLUCOSE, CAPILLARY: Glucose-Capillary: 94 mg/dL (ref 70–99)

## 2020-11-25 LAB — RAPID URINE DRUG SCREEN, HOSP PERFORMED
Amphetamines: NOT DETECTED
Barbiturates: NOT DETECTED
Benzodiazepines: NOT DETECTED
Cocaine: NOT DETECTED
Opiates: NOT DETECTED
Tetrahydrocannabinol: POSITIVE — AB

## 2020-11-25 LAB — TSH: TSH: 0.442 u[IU]/mL (ref 0.350–4.500)

## 2020-11-25 LAB — HEMOGLOBIN A1C
Hgb A1c MFr Bld: 5.9 % — ABNORMAL HIGH (ref 4.8–5.6)
Mean Plasma Glucose: 122.63 mg/dL

## 2020-11-25 MED ORDER — ZIPRASIDONE HCL 80 MG PO CAPS
80.0000 mg | ORAL_CAPSULE | Freq: Every day | ORAL | Status: DC
  Filled 2020-11-25 (×2): qty 1

## 2020-11-25 MED ORDER — ZIPRASIDONE HCL 80 MG PO CAPS
80.0000 mg | ORAL_CAPSULE | Freq: Every day | ORAL | Status: DC
  Filled 2020-11-25: qty 1

## 2020-11-25 MED ORDER — ZIPRASIDONE HCL 40 MG PO CAPS
40.0000 mg | ORAL_CAPSULE | Freq: Two times a day (BID) | ORAL | Status: DC
  Administered 2020-11-25 – 2020-11-29 (×8): 40 mg via ORAL
  Filled 2020-11-25 (×4): qty 1
  Filled 2020-11-25: qty 2
  Filled 2020-11-25 (×2): qty 1
  Filled 2020-11-25: qty 2
  Filled 2020-11-25 (×6): qty 1

## 2020-11-25 MED ORDER — PAROXETINE HCL 10 MG PO TABS
10.0000 mg | ORAL_TABLET | Freq: Every day | ORAL | Status: DC
  Administered 2020-11-26 – 2020-11-28 (×3): 10 mg via ORAL
  Filled 2020-11-25 (×4): qty 1

## 2020-11-25 MED ORDER — ZIPRASIDONE HCL 40 MG PO CAPS
40.0000 mg | ORAL_CAPSULE | Freq: Once | ORAL | Status: AC
  Administered 2020-11-25: 40 mg via ORAL
  Filled 2020-11-25: qty 1

## 2020-11-25 MED ORDER — SEMAGLUTIDE(0.25 OR 0.5MG/DOS) 2 MG/1.5ML ~~LOC~~ SOPN
0.5000 mg | PEN_INJECTOR | SUBCUTANEOUS | Status: DC
  Administered 2020-11-30: 0.5 mg via INTRAMUSCULAR
  Filled 2020-11-25 (×3): qty 0.38

## 2020-11-25 NOTE — Progress Notes (Signed)
Adult Psychoeducational Group Note  Date:  11/25/2020 Time:  3:03 PM  Group Topic/Focus:  Goals Group:   The focus of this group is to help patients establish daily goals to achieve during treatment and discuss how the patient can incorporate goal setting into their daily lives to aide in recovery.  Participation Level:  Active  Participation Quality:  Appropriate and Attentive  Affect:  Anxious and Appropriate  Cognitive:  Alert and Appropriate  Insight: Appropriate and Good  Engagement in Group:  Engaged  Modes of Intervention:  Activity  Additional Comments:  Pt attended morning goals group. Pt would like to speak to the MD about med management because pt is still hearing voices.  Alan Rangel 11/25/2020, 3:03 PM

## 2020-11-25 NOTE — Progress Notes (Addendum)
[facility] MD Progress Note  11/25/2020 2:56 PM [name]  MRN:  [id number] Subjective:   Alan Rangel is a 67 YOM with a PMHx of HIV and DM and a PPHx of MDD with psychotic features, schizoaffective disorder (bipolar vs depressed type) presenting voluntarily for command AH and SI with a plan.   24 hr events: no documented behavioral issues, no PRN medications given for agitation.  On interview this morning, patient is distressed about a cup in his room that has "Alan Rangel" scratched into it . Patient is not sure who did this and denies that it was him. Patient is able to correctly identify why is in the hospital ("hearing voices"). When asked a common sense question, patient responds with an inappropriate answer that is dramatic. He then goes on a tangent about how his life is not worth living. Patient denies SI and contracts for safety. Patient denies medication side effects, ROS as below.   Principal Problem: MDD (major depressive disorder), recurrent, severe, with psychosis (HCC) Diagnosis: Principal Problem:   MDD (major depressive disorder), recurrent, severe, with psychosis (HCC) Active Problems:   HIV INFECTION   PTSD (post-traumatic stress disorder)   Schizoaffective disorder (HCC)  Total Time spent in direct patient care: 1 hour  Past Psychiatric History: as above  Past Medical History:  Past Medical History:  Diagnosis Date   Depression    Episodic mood disorder (HCC)    Generalized anxiety disorder    HIV (human immunodeficiency virus infection) (HCC)    HTN (hypertension)    Panic attack    Paranoid schizophrenia (HCC)    Personality disorder (HCC)    Schizoaffective disorder, bipolar type (HCC)     Past Surgical History:  Procedure Laterality Date   SKIN GRAFT Right    foot/leg   Family History:  Family History  Problem Relation Age of Onset   Sinusitis Mother    Mental illness Father    Hypertension Maternal Grandmother    Diabetes Maternal Grandmother     Family Psychiatric  History: negative Social History:  Social History   Substance and Sexual Activity  Alcohol Use No   Alcohol/week: 0.0 standard drinks   Comment: History of, but not currently     Social History   Substance and Sexual Activity  Drug Use No    Social History   Socioeconomic History   Marital status: Single    Spouse name: Not on file   Number of children: 0   Years of education: college   Highest education level: Not on file  Occupational History   Occupation: UNEMPLOYED    Employer: UNEMPLOYED   Occupation: Consulting civil engineer  Tobacco Use   Smoking status: Every Day    Packs/day: 0.50    Years: 10.00    Pack years: 5.00    Types: Cigarettes   Smokeless tobacco: Never   Tobacco comments:    would like patches  Vaping Use   Vaping Use: Never used  Substance and Sexual Activity   Alcohol use: No    Alcohol/week: 0.0 standard drinks    Comment: History of, but not currently   Drug use: No   Sexual activity: Never    Partners: Female, Male    Birth control/protection: Condom    Comment: pt given condoms 08/2020  Other Topics Concern   Not on file  Social History Narrative   Not on file   Social Determinants of Health   Financial Resource Strain: Not on file  Food Insecurity: Not  on file  Transportation Needs: Not on file  Physical Activity: Not on file  Stress: Not on file  Social Connections: Not on file   Additional Social History:     Sleep: Fair  Appetite:  Fair  Current Medications: Current Facility-Administered Medications  Medication Dose Route Frequency Provider Last Rate Last Admin   acetaminophen (TYLENOL) tablet 650 mg  650 mg Oral Q6H PRN Antonieta Pert, MD       alum & mag hydroxide-simeth (MAALOX/MYLANTA) 200-200-20 MG/5ML suspension 30 mL  30 mL Oral Q4H PRN Antonieta Pert, MD       amLODipine (NORVASC) tablet 10 mg  10 mg Oral Daily Antonieta Pert, MD   10 mg at 11/25/20 0805   atorvastatin (LIPITOR) tablet 10  mg  10 mg Oral Daily Antonieta Pert, MD   10 mg at 11/25/20 7782   clonazePAM (KLONOPIN) tablet 0.5 mg  0.5 mg Oral BID Antonieta Pert, MD   0.5 mg at 11/25/20 4235   dolutegravir (TIVICAY) tablet 50 mg  50 mg Oral Daily Antonieta Pert, MD   50 mg at 11/25/20 0804   emtricitabine-tenofovir AF (DESCOVY) 200-25 MG per tablet 1 tablet  1 tablet Oral Daily Antonieta Pert, MD   1 tablet at 11/25/20 0804   hydrOXYzine (ATARAX/VISTARIL) tablet 25 mg  25 mg Oral TID PRN Antonieta Pert, MD       magnesium hydroxide (MILK OF MAGNESIA) suspension 30 mL  30 mL Oral Daily PRN Antonieta Pert, MD       nicotine (NICODERM CQ - dosed in mg/24 hours) patch 14 mg  14 mg Transdermal Daily Antonieta Pert, MD   14 mg at 11/25/20 3614   oxyCODONE-acetaminophen (PERCOCET/ROXICET) 5-325 MG per tablet 1-2 tablet  1-2 tablet Oral Q6H PRN Antonieta Pert, MD       PARoxetine (PAXIL) tablet 5 mg  5 mg Oral Daily Antonieta Pert, MD   5 mg at 11/25/20 0804   [START ON 11/30/2020] Semaglutide(0.25 or 0.5MG /DOS) Namon Cirri 0.5 mg  0.5 mg Injection Weekly Ladona Ridgel, Cody W, PA-C       traZODone (DESYREL) tablet 50 mg  50 mg Oral QHS PRN Antonieta Pert, MD       ziprasidone (GEODON) capsule 40 mg  40 mg Oral BID WC Carlyn Reichert, MD        Lab Results:  Results for orders placed or performed during the hospital encounter of 11/24/20 (from the past 48 hour(s))  Urine rapid drug screen (hosp performed)not at [facility]     Status: Abnormal   Collection Time: 11/25/20  2:57 AM  Result Value Ref Range   Opiates NONE DETECTED NONE DETECTED   Cocaine NONE DETECTED NONE DETECTED   Benzodiazepines NONE DETECTED NONE DETECTED   Amphetamines NONE DETECTED NONE DETECTED   Tetrahydrocannabinol POSITIVE (A) NONE DETECTED   Barbiturates NONE DETECTED NONE DETECTED    Comment: (NOTE) DRUG SCREEN FOR MEDICAL PURPOSES ONLY.  IF CONFIRMATION IS NEEDED FOR ANY PURPOSE, NOTIFY LAB WITHIN 5 DAYS.  LOWEST DETECTABLE  LIMITS FOR URINE DRUG SCREEN Drug Class                     Cutoff (ng/mL) Amphetamine and metabolites    1000 Barbiturate and metabolites    200 Benzodiazepine                 200 Tricyclics and metabolites     300 Opiates  and metabolites        300 Cocaine and metabolites        300 THC                            50 Performed at [facility], [address], [place], [place] [address]   Glucose, capillary     Status: None   Collection Time: 11/25/20  6:14 AM  Result Value Ref Range   Glucose-Capillary 94 70 - 99 mg/dL    Comment: Glucose reference range applies only to samples taken after fasting for at least 8 hours.   Comment 1 Notify RN    Comment 2 Document in Chart   Hemoglobin A1c     Status: Abnormal   Collection Time: 11/25/20  6:28 AM  Result Value Ref Range   Hgb A1c MFr Bld 5.9 (H) 4.8 - 5.6 %    Comment: (NOTE) Pre diabetes:          5.7%-6.4%  Diabetes:              >6.4%  Glycemic control for   <7.0% adults with diabetes    Mean Plasma Glucose 122.63 mg/dL    Comment: Performed at [facility], [address] [place], [place] [address]  Lipid panel     Status: Abnormal   Collection Time: 11/25/20  6:28 AM  Result Value Ref Range   Cholesterol 153 0 - 200 mg/dL   Triglycerides 99 <696 mg/dL   HDL 30 (L) >29 mg/dL   Total CHOL/HDL Ratio 5.1 RATIO   VLDL 20 0 - 40 mg/dL   LDL Cholesterol 528 (H) 0 - 99 mg/dL    Comment:        Total Cholesterol/HDL:CHD Risk Coronary Heart Disease Risk Table                     Men   Women  1/2 Average Risk   3.4   3.3  Average Risk       5.0   4.4  2 X Average Risk   9.6   7.1  3 X Average Risk  23.4   11.0        Use the calculated Patient Ratio above and the CHD Risk Table to determine the patient's CHD Risk.        ATP III CLASSIFICATION (LDL):  <100     mg/dL   Optimal  413-244  mg/dL   Near or Above                    Optimal  130-159  mg/dL   Borderline  010-272  mg/dL   High   >536     mg/dL   Very High Performed at [facility], [address], [place], [place] [address]   TSH     Status: None   Collection Time: 11/25/20  6:28 AM  Result Value Ref Range   TSH 0.442 0.350 - 4.500 uIU/mL    Comment: Performed by a 3rd Generation assay with a functional sensitivity of <=0.01 uIU/mL. Performed at [facility], [address] [place], [place] [address]     Blood Alcohol level:  Lab Results  Component Value Date   [employer] <10 11/23/2020   ETH <10 05/08/2017    Metabolic Disorder Labs: Lab Results  Component Value Date   HGBA1C 5.9 (H) 11/25/2020  MPG 122.63 11/25/2020   MPG 117 (H) 06/17/2013   No results found for: PROLACTIN Lab Results  Component Value Date   CHOL 153 11/25/2020   TRIG 99 11/25/2020   HDL 30 (L) 11/25/2020   CHOLHDL 5.1 11/25/2020   VLDL 20 11/25/2020   LDLCALC 103 (H) 11/25/2020   LDLCALC 97 04/13/2020    Physical Findings: AIMS: Facial and Oral Movements Muscles of Facial Expression: None, normal Lips and Perioral Area: None, normal Jaw: None, normal Tongue: None, normal,Extremity Movements Upper (arms, wrists, hands, fingers): None, normal Lower (legs, knees, ankles, toes): None, normal, Trunk Movements Neck, shoulders, hips: None, normal, Overall Severity Severity of abnormal movements (highest score from questions above): None, normal Incapacitation due to abnormal movements: None, normal Patient's awareness of abnormal movements (rate only patient's report): No Awareness, Dental Status Current problems with teeth and/or dentures?: No Does patient usually wear dentures?: No  CIWA:    COWS:     Musculoskeletal: Strength & Muscle Tone: within normal limits Gait & Station: normal Patient leans: N/A  Psychiatric Specialty Exam:  Presentation  General Appearance: Fairly Groomed  Eye Contact:Poor  Speech:Slow  Speech Volume:Decreased  Handedness:Right   Mood and  Affect  Mood:Dysphoric  Affect:Congruent   Thought Process  Thought Processes:Irrevelant; Linear  Descriptions of Associations:Intact  Orientation:Full (Time, Place and Person)  Thought Content:Scattered, paranoid  History of Schizophrenia/Schizoaffective disorder:Yes  Duration of Psychotic Symptoms:Greater than six months  Hallucinations:Hallucinations: Auditory; Command Description of Command Hallucinations: AH that tell him to sommit suicide Description of Auditory Hallucinations: as above  Ideas of Reference:None  Suicidal Thoughts:Suicidal Thoughts: No SI Active Intent and/or Plan: Without Intent; Without Plan  Homicidal Thoughts:Homicidal Thoughts: No   Sensorium  Memory:Immediate Fair; Recent Fair; Remote Fair  Judgment:Poor  Insight:Poor   Executive Functions  Concentration:Fair  Attention Span:Fair  Recall:Fair  Fund of Knowledge:Fair  Language:Fair   Psychomotor Activity  Psychomotor Activity:Psychomotor Activity: Normal   Assets  Assets:Desire for Improvement; Resilience; Social Support; Housing; Financial Resources/Insurance   Sleep  Sleep:Sleep: Fair Number of Hours of Sleep: 5.25   Physical Exam Constitutional:      Appearance: He is obese.  HENT:     Head: Normocephalic and atraumatic.  Eyes:     Extraocular Movements: Extraocular movements intact.  Cardiovascular:     Rate and Rhythm: Normal rate and regular rhythm.  Pulmonary:     Effort: Pulmonary effort is normal.  Musculoskeletal:        General: Normal range of motion.  Neurological:     General: No focal deficit present.     Mental Status: He is alert.   Review of Systems  Respiratory:  Negative for shortness of breath.   Cardiovascular:  Negative for chest pain.  Neurological:  Negative for headaches.  Blood pressure 138/90, pulse 81, temperature 98.2 F (36.8 C), temperature source Oral, resp. rate 16, height 6\' 1"  (1.854 m), weight (!) 149.7 kg, SpO2 99 %.  Body mass index is 43.54 kg/m.   Safety and Monitoring: -- VOLUNTARY admission to inpatient psychiatric unit for safety, stabilization and treatment -- Daily contact with patient to assess and evaluate symptoms and progress in treatment -- Patient's case to be discussed in multi-disciplinary team meeting -- Observation Level : q15 minute checks -- Vital signs:  q12 hours -- Precautions: suicide  Psychosis, PPHx of Schizoaffective d/o and MDD w psychotic features Pt is currently paranoid with disordered thought, denying AH AP labs: A1C 5.9, lipids with LDL 103 HDL 30 (on  atorvastatin), EKG w sinus brady, Qtc of 400 Reports taking his Geodon once per day, unclear if with food -Patient received Geodon 40 mg daily 7/14 -Start Geodon 40 mg bid 7/15  Repeat EKG 7/17  Anxiety and depression -Klonopin 0.5 mg bid for anxiety and panic symptoms (patient takes PRN as OP) -Paxil 5 mg (started 7/14), increase to 10 mg 7/16 given good tolerability  Tobacco use disorder -Nicoderm patch 14 mg  Medical management  HIV: continue home meds: Tivicay 50 mg PO daily, Descovy 200-25 mg PO daily -Last VL undetectable, last CD4 1,700 (08/2020) -Recheck labs given recent psychiatric problems and questionable compliance (unable to check until Monday given cytometry lab is closed on weekends) -Monitor for signs of infection  DM: continue semaglutide injection weekly (last dose 7/13), well controlled with A1C of 5.9 (technically does not meet criteria for DM) -Monitor with daily AM POC glucose  Admission labs:  BMP w Cr of 1.32 (will recheck tomorrow)  CBC wnl  UDS with benzos (prescribed) and THC   Treatment Plan Summary: Daily contact with patient to assess and evaluate symptoms and progress in treatment and Medication management   Carlyn Reichert PGY-1, Psychiatry

## 2020-11-25 NOTE — Progress Notes (Signed)
Recreation Therapy Notes  Date: 7.15.22 Time: 0930 Location: [address]  Group Topic: Stress Management   Goal Area(s) Addresses:  Patient will actively participate in stress management techniques presented during session.  Patient will successfully identify benefit of practicing stress management post d/c.   Behavioral Response: Appropriate  Intervention: Meditation with ambient sound and script  Activity:  Meditation  LRT explained the stress management technique of meditation to patients. Patients was asked to participate in the technique introduced during session. LRT also debriefed patients on the topic. Patients were given suggestions of where to access meditations post d/c and encouraged to explore Youtube and other apps available on smartphones, tablets, and computers.   Education:  Stress Management, Discharge Planning.   Education Outcome: Acknowledges education  Clinical Observations/Feedback: Patient actively engaged in technique introduced and expressed no concerns.  Patient expressed that he enjoyed the activity and loved the beach sounds because he is a beach person.    Caroll Rancher, LRT/CTRS      Lillia Abed, Krystofer Hevener A 11/25/2020 11:02 AM

## 2020-11-25 NOTE — Progress Notes (Signed)
PT is alert and oriented to person, place, time and situation. Pt is calm, cooperative, denies suicidal and homicidal ideation, denies hallucinations. Pt is pleasant, medication compliant, voices no complaints. Will continue to monitor pt per Q15 minute face checks and monitor for safety and progress.

## 2020-11-25 NOTE — [facility] Group Notes (Signed)
  [facility] LCSW Group Therapy     Type of Therapy and Topic:  Group Therapy: Conflict Resolution   Participation Level:  Did not attend     Description of Group:   In this group session, patients started introductions, ground rules and an ice braker.  Patients shared "something that they would put up a serious fight for, if someone were trying to take it away?". Patients were asked why it is so important to them. CSW introduced group topic of conflict resolution. Patients were asked to share a disagreement, argument or fight they had with someone - analyzing how it ended and how it made them feel.  CSW provided psycho-education on conflict resolution. Patients had a discussion about different conflicts that they were experiencing and discussed different ways to resolve the conflict. Patients were asked to identify the conflict, reasons why its important to resolve the conflict, how the people in the scenarios feel and two suggestions to resolve the conflict (one positive). Patients were asked to close in a discussion analyzing why it's important to resolve conflict and how at times conflict can be a good thing or lead to positive outcomes.   Therapeutic Goals: Patients will remember a time they had a conflict and analyze their reactions. Patients will comprehend concepts related to conflict resolution. Patients will identify feelings and needs behind conflicts. Patients will engage in realistic role play scenarios. Patients will learn "I statements" to increase positive communication in situations where conflict arises. Patients will generate creative solutions for resolving conflict cooperatively.   Summary of Patient Progress:  Did not attend group    Therapeutic Modalities:   Cognitive Behavioral Therapy Motivational Interviewing Brief Therapy     Reinhardt Licausi, LCSW, LCAS Clincal Social Worker [facility]

## 2020-11-25 NOTE — Progress Notes (Signed)
Adult Psychoeducational Group Note  Date:  11/25/2020 Time:  10:49 PM  Group Topic/Focus:  Wrap-Up Group:   The focus of this group is to help patients review their daily goal of treatment and discuss progress on daily workbooks.  Participation Level:  Did Not Attend  Participation Quality:  Did Not Attend  Affect:  Did Not Attend  Cognitive:  Did Not Attend  Insight: None  Engagement in Group:  Did Not Attend  Modes of Intervention:  Did Not Attend  Additional Comments:  Pt did not attend evening wrap up group tonight.   MADIX BLOWE 11/25/2020, 10:49 PM

## 2020-11-25 NOTE — Tx Team (Signed)
Interdisciplinary Treatment and Diagnostic Plan Update  11/25/2020 Time of Session: 9:35am Alan Rangel MRN: [id number]  Principal Diagnosis: <principal problem not specified>  Secondary Diagnoses: Active Problems:   MDD (major depressive disorder), recurrent, severe, with psychosis ([place])   Current Medications:  Current Facility-Administered Medications  Medication Dose Route Frequency Provider Last Rate Last Admin   acetaminophen (TYLENOL) tablet 650 mg  650 mg Oral Q6H PRN Sharma Covert, MD       alum & mag hydroxide-simeth (MAALOX/MYLANTA) 200-200-20 MG/5ML suspension 30 mL  30 mL Oral Q4H PRN Sharma Covert, MD       amLODipine (NORVASC) tablet 10 mg  10 mg Oral Daily Sharma Covert, MD   10 mg at 11/25/20 0805   atorvastatin (LIPITOR) tablet 10 mg  10 mg Oral Daily Sharma Covert, MD   10 mg at 11/25/20 9030   clonazePAM (KLONOPIN) tablet 0.5 mg  0.5 mg Oral BID Sharma Covert, MD   0.5 mg at 11/25/20 0923   dolutegravir (TIVICAY) tablet 50 mg  50 mg Oral Daily Sharma Covert, MD   50 mg at 11/25/20 0804   emtricitabine-tenofovir AF (DESCOVY) 200-25 MG per tablet 1 tablet  1 tablet Oral Daily Sharma Covert, MD   1 tablet at 11/25/20 0804   hydrOXYzine (ATARAX/VISTARIL) tablet 25 mg  25 mg Oral TID PRN Sharma Covert, MD       magnesium hydroxide (MILK OF MAGNESIA) suspension 30 mL  30 mL Oral Daily PRN Sharma Covert, MD       nicotine (NICODERM CQ - dosed in mg/24 hours) patch 14 mg  14 mg Transdermal Daily Sharma Covert, MD   14 mg at 11/25/20 3007   oxyCODONE-acetaminophen (PERCOCET/ROXICET) 5-325 MG per tablet 1-2 tablet  1-2 tablet Oral Q6H PRN Sharma Covert, MD       PARoxetine (PAXIL) tablet 5 mg  5 mg Oral Daily Sharma Covert, MD   5 mg at 11/25/20 0804   [START ON 11/30/2020] Semaglutide(0.25 or 0.5MG/DOS) SOPN 0.5 mg  0.5 mg Injection Weekly Lovena Le, Cody W, PA-C       traZODone (DESYREL) tablet 50 mg  50 mg Oral QHS PRN  Sharma Covert, MD       ziprasidone (GEODON) capsule 40 mg  40 mg Oral BID WC Corky Sox, MD       PTA Medications: Medications Prior to Admission  Medication Sig Dispense Refill Last Dose   clonazePAM (KLONOPIN) 0.5 MG tablet Take 1 tablet (0.5 mg total) by mouth 2 (two) times daily as needed (Anxiety). 10 tablet 0    dolutegravir (TIVICAY) 50 MG tablet Take 1 tablet by mouth daily. 30 tablet 5    emtricitabine-tenofovir AF (DESCOVY) 200-25 MG tablet Take 1 tablet by mouth daily. 30 tablet 5    OZEMPIC, 0.25 OR 0.5 MG/DOSE, 2 MG/1.5ML SOPN Inject 0.5 mg as directed once a week.      sertraline (ZOLOFT) 100 MG tablet Take 100 mg by mouth daily.      ziprasidone (GEODON) 40 MG capsule Take 40 mg by mouth daily.       Patient Stressors: Financial difficulties Health problems Medication change or noncompliance  Patient Strengths: Ability for insight Average or above average intelligence Capable of independent living Motivation for treatment/growth Supportive family/friends Work skills  Treatment Modalities: Medication Management, Group therapy, Case management,  1 to 1 session with clinician, Psychoeducation, Recreational therapy.   Physician Treatment Plan for Primary  Diagnosis: <principal problem not specified> Long Term Goal(s): Improvement in symptoms so as ready for discharge   Short Term Goals: Ability to identify changes in lifestyle to reduce recurrence of condition will improve Ability to verbalize feelings will improve Ability to disclose and discuss suicidal ideas Ability to demonstrate self-control will improve Ability to identify and develop effective coping behaviors will improve Ability to maintain clinical measurements within normal limits will improve  Medication Management: Evaluate patient's response, side effects, and tolerance of medication regimen.  Therapeutic Interventions: 1 to 1 sessions, Unit Group sessions and Medication  administration.  Evaluation of Outcomes: Not Met  Physician Treatment Plan for Secondary Diagnosis: Active Problems:   MDD (major depressive disorder), recurrent, severe, with psychosis ([place])  Long Term Goal(s): Improvement in symptoms so as ready for discharge   Short Term Goals: Ability to identify changes in lifestyle to reduce recurrence of condition will improve Ability to verbalize feelings will improve Ability to disclose and discuss suicidal ideas Ability to demonstrate self-control will improve Ability to identify and develop effective coping behaviors will improve Ability to maintain clinical measurements within normal limits will improve     Medication Management: Evaluate patient's response, side effects, and tolerance of medication regimen.  Therapeutic Interventions: 1 to 1 sessions, Unit Group sessions and Medication administration.  Evaluation of Outcomes: Not Met   RN Treatment Plan for Primary Diagnosis: <principal problem not specified> Long Term Goal(s): Knowledge of disease and therapeutic regimen to maintain health will improve  Short Term Goals: Ability to remain free from injury will improve, Ability to verbalize frustration and anger appropriately will improve, Ability to demonstrate self-control, Ability to identify and develop effective coping behaviors will improve, and Compliance with prescribed medications will improve  Medication Management: RN will administer medications as ordered by provider, will assess and evaluate patient's response and provide education to patient for prescribed medication. RN will report any adverse and/or side effects to prescribing provider.  Therapeutic Interventions: 1 on 1 counseling sessions, Psychoeducation, Medication administration, Evaluate responses to treatment, Monitor vital signs and CBGs as ordered, Perform/monitor CIWA, COWS, AIMS and Fall Risk screenings as ordered, Perform wound care treatments as  ordered.  Evaluation of Outcomes: Not Met   LCSW Treatment Plan for Primary Diagnosis: <principal problem not specified> Long Term Goal(s): Safe transition to appropriate next level of care at discharge, Engage patient in therapeutic group addressing interpersonal concerns.  Short Term Goals: Engage patient in aftercare planning with referrals and resources, Increase social support, Increase ability to appropriately verbalize feelings, Increase emotional regulation, and Facilitate acceptance of mental health diagnosis and concerns  Therapeutic Interventions: Assess for all discharge needs, 1 to 1 time with Social worker, Explore available resources and support systems, Assess for adequacy in community support network, Educate family and significant other(s) on suicide prevention, Complete Psychosocial Assessment, Interpersonal group therapy.  Evaluation of Outcomes: Not Met   Progress in Treatment: Attending groups: Yes. and No. Participating in groups: Yes. and No. Taking medication as prescribed: Yes. Toleration medication: Yes. Family/Significant other contact made: No, will contact:  if consent is provided Patient understands diagnosis: Yes. Discussing patient identified problems/goals with staff: Yes. Medical problems stabilized or resolved: Yes. Denies suicidal/homicidal ideation: Yes. Issues/concerns per patient self-inventory: No.   New problem(s) identified: No, Describe:  none  New Short Term/Long Term Goal(s): medication stabilization, elimination of SI thoughts, development of comprehensive mental wellness plan.    Patient Goals:  Did not attend  Discharge Plan or Barriers: Patient recently admitted.  CSW will continue to follow and assess for appropriate referrals and possible discharge planning.     Reason for Continuation of Hospitalization: Depression Hallucinations Medication stabilization Suicidal ideation  Estimated Length of Stay: 3-5  days  Attendees: Patient: Did not attend 11/25/2020 1:33 PM  Physician: Lestine Mount, DO 11/25/2020 1:33 PM  Nursing:  11/25/2020 1:33 PM  RN Care Manager: 11/25/2020 1:33 PM  Social Worker: Darletta Moll, LCSW 11/25/2020 1:33 PM  Recreational Therapist:  11/25/2020 1:33 PM  Other:  11/25/2020 1:33 PM  Other:  11/25/2020 1:33 PM  Other: 11/25/2020 1:33 PM    Scribe for Treatment Team: Vassie Moselle, LCSW 11/25/2020 2:01 PM

## 2020-11-26 LAB — BASIC METABOLIC PANEL
Anion gap: 7 (ref 5–15)
BUN: 11 mg/dL (ref 6–20)
CO2: 26 mmol/L (ref 22–32)
Calcium: 8.8 mg/dL — ABNORMAL LOW (ref 8.9–10.3)
Chloride: 108 mmol/L (ref 98–111)
Creatinine, Ser: 1.24 mg/dL (ref 0.61–1.24)
GFR, Estimated: >60 mL/min (ref >60)
Glucose, Bld: 113 mg/dL — ABNORMAL HIGH (ref 70–99)
Potassium: 4 mmol/L (ref 3.5–5.1)
Sodium: 141 mmol/L (ref 135–145)

## 2020-11-26 LAB — GLUCOSE, CAPILLARY: Glucose-Capillary: 90 mg/dL (ref 70–99)

## 2020-11-26 NOTE — [facility] Group Notes (Signed)
LCSW Group Therapy Note  11/26/2020   10:00-11:00am   Type of Therapy and Topic:  Group Therapy: Anger Cues and Responses  Participation Level:  Active   Description of Group:   In this group, patients learned how to recognize the physical, cognitive, emotional, and behavioral responses they have to anger-provoking situations.  They identified something that frequently makes them angry and how they react.  They analyzed how their reaction is possibly beneficial and how it is possibly unhelpful.  The group discussed a variety of healthier coping skills that could help with such a situation in the future.  Focus was placed on how helpful it is to recognize the underlying emotions to our anger, because working on those can lead to a more permanent solution as well as our ability to focus on the important rather than the urgent.  Therapeutic Goals: Patients will remember their last incident of anger and how they felt emotionally and physically, what their thoughts were at the time, and how they behaved. Patients will identify how their behavior at that time worked for them, as well as how it worked against them. Patients will explore possible new behaviors to use in future anger situations. Patients will learn that anger itself is normal and cannot be eliminated, and that healthier reactions can assist with resolving conflict rather than worsening situations.  Summary of Patient Progress:  The patient shared that his frequent cause of anger is seeing people he cares about mistreated and said he will become a bully to the person who is acting in such a way, rather than reacting "as a mature adult."  He asked questions and listened attentively as group members tried to share their ideas and thoughts with him.  He talked about his isolation extensively and expressed an appreciation for being around people who are not judging him.  Therapeutic Modalities:   Cognitive Behavioral Therapy  Lynnell Chad

## 2020-11-26 NOTE — Progress Notes (Signed)
   11/26/20 0557  Psych Admission Type (Psych Patients Only)  Admission Status Voluntary  Psychosocial Assessment  Patient Complaints None  Eye Contact Brief  Facial Expression Other (Comment) (appropriate)  Affect Sad  Speech Logical/coherent  Interaction Assertive;Minimal  Motor Activity Slow;Unsteady  Appearance/Hygiene Unremarkable  Behavior Characteristics Cooperative;Anxious  Mood Anxious;Pleasant  Thought Process  Coherency Concrete thinking  Content WDL  Delusions None reported or observed  Perception Hallucinations  Hallucination None reported or observed  Judgment Limited  Confusion None  Danger to Self  Current suicidal ideation? Denies  Danger to Others  Danger to Others None reported or observed   Pt slept the entire shift. Pt said he felt he hadn't slept in a couple days and it was making him "trip out." Pt denies SI, HI, AVH and pain. Says his back feels good after getting rest. CBG-90. VS stable.

## 2020-11-26 NOTE — Progress Notes (Addendum)
[facility]  11/26/2020 12:16 PM Alan BersJames Lawless  MRN:  [id number] Subjective:  Alan Rangel is a [age] YOM with a PMHx of HIV and DM and a PPHx of MDD with psychotic features, schizoaffective disorder (bipolar vs depressed type) presenting voluntarily for command AH and SI with a plan.   On assessment this AM patient was found in his dark room, standing out the window. Patient reported to provider that he had been looking out the window, but he was also very happy the Provider knocked because he began having intrusive thoughts/ hearing the "voice" in his head talk about SI. Patient reports he has no plan but the thoughts really bother him and reports that the voice has controlled him at times by telling him if he left his bed, negative things would happen. Patient reports that this voice is his own "but a different version." Patient reports that he thinks spending time around others will be beneficial and he will try to stay out of his room more today. Patient denies HI and VH.   Principal Problem: MDD (major depressive disorder), recurrent, severe, with psychosis (HCC) Diagnosis: Principal Problem:   MDD (major depressive disorder), recurrent, severe, with psychosis (HCC) Active Problems:   HIV INFECTION   PTSD (post-traumatic stress disorder)   Schizoaffective disorder (HCC)  Total Time spent with patient: 15 minutes  Past Psychiatric History: Per H&P  Past Medical History:  Past Medical History:  Diagnosis Date   Depression    Episodic mood disorder (HCC)    Generalized anxiety disorder    HIV (human immunodeficiency virus infection) (HCC)    HTN (hypertension)    Panic attack    Paranoid schizophrenia (HCC)    Personality disorder (HCC)    Schizoaffective disorder, bipolar type (HCC)     Past Surgical History:  Procedure Laterality Date   SKIN GRAFT Right    foot/leg   Family History:  Family History  Problem Relation Age of Onset   Sinusitis Mother    Mental illness  Father    Hypertension Maternal Grandmother    Diabetes Maternal Grandmother    Family Psychiatric  History: as above Social History:  Social History   Substance and Sexual Activity  Alcohol Use No   Alcohol/week: 0.0 standard drinks   Comment: History of, but not currently     Social History   Substance and Sexual Activity  Drug Use No    Social History   Socioeconomic History   Marital status: Single    Spouse name: Not on file   Number of children: 0   Years of education: college   Highest education level: Not on file  Occupational History   Occupation: UNEMPLOYED    Employer: UNEMPLOYED   Occupation: Consulting civil engineerstudent  Tobacco Use   Smoking status: Every Day    Packs/day: 0.50    Years: 10.00    Pack years: 5.00    Types: Cigarettes   Smokeless tobacco: Never   Tobacco comments:    would like patches  Vaping Use   Vaping Use: Never used  Substance and Sexual Activity   Alcohol use: No    Alcohol/week: 0.0 standard drinks    Comment: History of, but not currently   Drug use: No   Sexual activity: Never    Partners: Female, Male    Birth control/protection: Condom    Comment: pt given condoms 08/2020  Other Topics Concern   Not on file  Social History Narrative   Not on  file   Social Determinants of Health   Financial Resource Strain: Not on file  Food Insecurity: Not on file  Transportation Needs: Not on file  Physical Activity: Not on file  Stress: Not on file  Social Connections: Not on file   Additional Social History:                         Sleep: Fair  Appetite:  Good  Current Medications: Current Facility-Administered Medications  Medication Dose Route Frequency Provider Last Rate Last Admin   acetaminophen (TYLENOL) tablet 650 mg  650 mg Oral Q6H PRN Antonieta Pert, MD       alum & mag hydroxide-simeth (MAALOX/MYLANTA) 200-200-20 MG/5ML suspension 30 mL  30 mL Oral Q4H PRN Antonieta Pert, MD       amLODipine (NORVASC)  tablet 10 mg  10 mg Oral Daily Antonieta Pert, MD   10 mg at 11/26/20 8413   atorvastatin (LIPITOR) tablet 10 mg  10 mg Oral Daily Antonieta Pert, MD   10 mg at 11/26/20 2440   clonazePAM (KLONOPIN) tablet 0.5 mg  0.5 mg Oral BID Antonieta Pert, MD   0.5 mg at 11/26/20 1027   dolutegravir (TIVICAY) tablet 50 mg  50 mg Oral Daily Antonieta Pert, MD   50 mg at 11/26/20 2536   emtricitabine-tenofovir AF (DESCOVY) 200-25 MG per tablet 1 tablet  1 tablet Oral Daily Antonieta Pert, MD   1 tablet at 11/26/20 6440   hydrOXYzine (ATARAX/VISTARIL) tablet 25 mg  25 mg Oral TID PRN Antonieta Pert, MD       magnesium hydroxide (MILK OF MAGNESIA) suspension 30 mL  30 mL Oral Daily PRN Antonieta Pert, MD       nicotine (NICODERM CQ - dosed in mg/24 hours) patch 14 mg  14 mg Transdermal Daily Antonieta Pert, MD   14 mg at 11/26/20 3474   oxyCODONE-acetaminophen (PERCOCET/ROXICET) 5-325 MG per tablet 1-2 tablet  1-2 tablet Oral Q6H PRN Antonieta Pert, MD       PARoxetine (PAXIL) tablet 10 mg  10 mg Oral Daily Carlyn Reichert, MD   10 mg at 11/26/20 2595   [START ON 11/30/2020] Semaglutide(0.25 or 0.5MG /DOS) Namon Cirri 0.5 mg  0.5 mg Injection Weekly Ladona Ridgel, Cody W, PA-C       traZODone (DESYREL) tablet 50 mg  50 mg Oral QHS PRN Antonieta Pert, MD       ziprasidone (GEODON) capsule 40 mg  40 mg Oral BID WC Carlyn Reichert, MD   40 mg at 11/26/20 6387    Lab Results:  Results for orders placed or performed during the hospital encounter of 11/24/20 (from the past 48 hour(s))  Urine rapid drug screen (hosp performed)[facility]     Status: Abnormal   Collection Time: 11/25/20  2:57 AM  Result Value Ref Range   Opiates NONE DETECTED NONE DETECTED   Cocaine NONE DETECTED NONE DETECTED   Benzodiazepines NONE DETECTED NONE DETECTED   Amphetamines NONE DETECTED NONE DETECTED   Tetrahydrocannabinol POSITIVE (A) NONE DETECTED   Barbiturates NONE DETECTED NONE DETECTED    Comment:  (NOTE) DRUG SCREEN FOR MEDICAL PURPOSES ONLY.  IF CONFIRMATION IS NEEDED FOR ANY PURPOSE, NOTIFY LAB WITHIN 5 DAYS.  LOWEST DETECTABLE LIMITS FOR URINE DRUG SCREEN Drug Class                     Cutoff (ng/mL)  Amphetamine and metabolites    1000 Barbiturate and metabolites    200 Benzodiazepine                 200 Tricyclics and metabolites     300 Opiates and metabolites        300 Cocaine and metabolites        300 THC                            50 Performed at [facility], [address] [place], [place] [address]   Glucose, capillary     Status: None   Collection Time: 11/25/20  6:14 AM  Result Value Ref Range   Glucose-Capillary 94 70 - 99 mg/dL    Comment: Glucose reference range applies only to samples taken after fasting for at least 8 hours.   Comment 1 Notify RN    Comment 2 Document in Chart   Hemoglobin A1c     Status: Abnormal   Collection Time: 11/25/20  6:28 AM  Result Value Ref Range   Hgb A1c MFr Bld 5.9 (H) 4.8 - 5.6 %    Comment: (NOTE) Pre diabetes:          5.7%-6.4%  Diabetes:              >6.4%  Glycemic control for   <7.0% adults with diabetes    Mean Plasma Glucose 122.63 mg/dL    Comment: Performed at [facility], [address], [place], [place] [address]  Lipid panel     Status: Abnormal   Collection Time: 11/25/20  6:28 AM  Result Value Ref Range   Cholesterol 153 0 - 200 mg/dL   Triglycerides 99 <098 mg/dL   HDL 30 (L) >11 mg/dL   Total CHOL/HDL Ratio 5.1 RATIO   VLDL 20 0 - 40 mg/dL   LDL Cholesterol 914 (H) 0 - 99 mg/dL    Comment:        Total Cholesterol/HDL:CHD Risk Coronary Heart Disease Risk Table                     Men   Women  1/2 Average Risk   3.4   3.3  Average Risk       5.0   4.4  2 X Average Risk   9.6   7.1  3 X Average Risk  23.4   11.0        Use the calculated Patient Ratio above and the CHD Risk Table to determine the patient's CHD Risk.        ATP III CLASSIFICATION (LDL):   <100     mg/dL   Optimal  782-956  mg/dL   Near or Above                    Optimal  130-159  mg/dL   Borderline  213-086  mg/dL   High  >578     mg/dL   Very High Performed at [facility], [address] [place], [place] [address]   TSH     Status: None   Collection Time: 11/25/20  6:28 AM  Result Value Ref Range   TSH 0.442 0.350 - 4.500 uIU/mL    Comment: Performed by a 3rd Generation assay with a functional sensitivity of <=0.01 uIU/mL. Performed at [facility], [address], [place], [place] [address]  Glucose, capillary     Status: None   Collection Time: 11/26/20  5:50 AM  Result Value Ref Range   Glucose-Capillary 90 70 - 99 mg/dL    Comment: Glucose reference range applies only to samples taken after fasting for at least 8 hours.  Basic metabolic panel     Status: Abnormal   Collection Time: 11/26/20  6:34 AM  Result Value Ref Range   Sodium 141 135 - 145 mmol/L   Potassium 4.0 3.5 - 5.1 mmol/L   Chloride 108 98 - 111 mmol/L   CO2 26 22 - 32 mmol/L   Glucose, Bld 113 (H) 70 - 99 mg/dL    Comment: Glucose reference range applies only to samples taken after fasting for at least 8 hours.   BUN 11 6 - 20 mg/dL   Creatinine, Ser 1.02 0.61 - 1.24 mg/dL   Calcium 8.8 (L) 8.9 - 10.3 mg/dL   GFR, Estimated >58 >52 mL/min    Comment: (NOTE) Calculated using the CKD-EPI Creatinine Equation (2021)    Anion gap 7 5 - 15    Comment: Performed at [facility], [address], [place], [place] [address]    Blood Alcohol level:  Lab Results  Component Value Date   ETH <10 11/23/2020   ETH <10 05/08/2017    Metabolic Disorder Labs: Lab Results  Component Value Date   HGBA1C 5.9 (H) 11/25/2020   MPG 122.63 11/25/2020   MPG 117 (H) 06/17/2013   No results found for: PROLACTIN Lab Results  Component Value Date   CHOL 153 11/25/2020   TRIG 99 11/25/2020   HDL 30 (L) 11/25/2020   CHOLHDL 5.1 11/25/2020    VLDL 20 11/25/2020   LDLCALC 103 (H) 11/25/2020   LDLCALC 97 04/13/2020    Physical Findings: AIMS: Facial and Oral Movements Muscles of Facial Expression: None, normal Lips and Perioral Area: None, normal Jaw: None, normal Tongue: None, normal,Extremity Movements Upper (arms, wrists, hands, fingers): None, normal Lower (legs, knees, ankles, toes): None, normal, Trunk Movements Neck, shoulders, hips: None, normal, Overall Severity Severity of abnormal movements (highest score from questions above): None, normal Incapacitation due to abnormal movements: None, normal Patient's awareness of abnormal movements (rate only patient's report): No Awareness, Dental Status Current problems with teeth and/or dentures?: No Does patient usually wear dentures?: No  CIWA:    COWS:     Musculoskeletal: Strength & Muscle Tone: within normal limits Gait & Station: normal Patient leans: N/A  Psychiatric Specialty Exam:  Presentation  General Appearance: Appropriate for Environment  Eye Contact:Good  Speech:Clear and Coherent  Speech Volume:Decreased  Handedness:Right   Mood and Affect  Mood:Dysphoric  Affect:Congruent   Thought Process  Thought Processes:Coherent  Descriptions of Associations:Intact  Orientation:Full (Time, Place and Person)  Thought Content:Logical  History of Schizophrenia/Schizoaffective disorder:Yes  Duration of Psychotic Symptoms:Greater than six months  Hallucinations:Hallucinations: Other (comment) Description of Command Hallucinations: AH that tell him to sommit suicide Description of Auditory Hallucinations: Patient reports hearing a voice in his head at times commanding and other times just talking, but he describes the voice as his own but a "different version."  Ideas of Reference:None  Suicidal Thoughts:Suicidal Thoughts: Yes, Passive (the "voice" continues to bring up suicide) SI Active Intent and/or Plan: Without Intent; Without Plan SI  Passive Intent and/or Plan: Without Intent  Homicidal Thoughts:Homicidal Thoughts: No   Sensorium  Memory:Immediate Good; Recent Good; Remote Good  Judgment:Fair  Insight:Shallow   Executive Functions  Concentration:Fair  Attention Span:Fair  Recall:Fair  Fund of Knowledge:Good  Language:Good   Psychomotor Activity  Psychomotor Activity:Psychomotor Activity: Psychomotor Retardation   Assets  Assets:Communication Skills; Desire for Improvement; Resilience; Social Support; Housing   Sleep  Sleep:Sleep: Fair Number of Hours of Sleep: 5.25    Physical Exam: Physical Exam Constitutional:      Appearance: He is obese.  HENT:     Head: Normocephalic and atraumatic.  Eyes:     Extraocular Movements: Extraocular movements intact.     Conjunctiva/sclera: Conjunctivae normal.  Cardiovascular:     Rate and Rhythm: Normal rate.  Pulmonary:     Effort: Pulmonary effort is normal.     Breath sounds: Normal breath sounds.  Abdominal:     General: There is no distension.  Musculoskeletal:        General: Normal range of motion.  Skin:    General: Skin is warm and dry.  Neurological:     General: No focal deficit present.     Mental Status: He is alert and oriented to person, place, and time.   Review of Systems  Constitutional:  Negative for chills and fever.  HENT:  Negative for hearing loss.   Eyes:  Negative for blurred vision.  Respiratory:  Negative for cough and wheezing.   Cardiovascular:  Negative for chest pain.  Gastrointestinal:  Negative for abdominal pain.  Neurological:  Negative for dizziness.  Psychiatric/Behavioral:  Positive for depression and suicidal ideas. The patient does not have insomnia.   Blood pressure (!) 138/91, pulse 67, temperature 97.9 F (36.6 C), temperature source Oral, resp. rate 16, height  (1.854 m), weight (!) 149.7 kg, SpO2 98 %. Body mass index is 43.54 kg/m.   Treatment Plan Summary: Daily contact with patient  to assess and evaluate symptoms and progress in treatment  Jadarious Dobbins is a 62 YOM with a PMHx of HIV and DM and a PPHx of MDD with psychotic features, schizoaffective disorder (bipolar vs depressed type) presenting voluntarily for command AH and SI with a plan.  On reassessment today patient indicates that his AH is his own voice but a different version and on initial assessment patient endorses a hx of significant trauma. It is possible that patient's reported AH is 2/2 to PTSD and not functional schizoaffective disorder. Regardless of the etiology Geodon is still recommended; however Paxil may be more beneficial to treat what appears to be underlying PTSD. Patient also appears to be making improvements in terms of mood.  Safety and Monitoring: -- VOLUNTARY admission to inpatient psychiatric unit for safety, stabilization and treatment -- Daily contact with patient to assess and evaluate symptoms and progress in treatment -- Patient's case to be discussed in multi-disciplinary team meeting -- Observation Level : q15 minute checks -- Vital signs:  q12 hours -- Precautions: suicide   Psychosis, PPHx of Schizoaffective d/o and MDD w psychotic features Pt reporting AH, command type AP labs: A1C 5.9, lipids with LDL 103 HDL 30 (on atorvastatin), EKG w sinus brady, Qtc of 400 -Continue Geodon 40 mg bid              Repeat EKG 7/17   Anxiety and depression -Klonopin 0.5 mg bid for anxiety and panic symptoms (patient takes PRN as OP) -Paxil 5 mg (started 7/14), increase to 10 mg 7/16 given good tolerability   Tobacco use disorder -Nicoderm patch 14 mg   Medical management If recheck of labs suggest poor compliance may discontinue therapy to decrease chance of resistance.  HIV: continue home meds: Tivicay 50 mg PO daily, Descovy 200-25 mg PO daily -Last VL undetectable, last CD4 1,700 (08/2020) -Recheck labs given recent psychiatric problems and questionable compliance (unable to check until  Monday given cytometry lab is closed on weekends) -Monitor for signs of infection   DM: continue semaglutide injection weekly (last dose 7/13), well controlled with A1C of 5.9 (technically does not meet criteria for DM) -Monitor with daily AM POC glucose   Admission labs:             BMP w Cr of 1.32 >1.24             CBC wnl             UDS with benzos (prescribed) and THC  PGY-2 Bobbye Morton, MD 11/26/2020, 12:16 PM

## 2020-11-26 NOTE — Progress Notes (Signed)
Pt endorses hearing voices telling him to hurt himself.  Pt said he responds well to distraction and pt has been attending groups and been in dayroom  interacting with peers for most of the day.  Pt says that he is "very pleased" from the care pt has been receiving at [facility].  Pt remains safe with q 15 min checks in place.  RN will continue to monitor and intervene as indicated.

## 2020-11-26 NOTE — Progress Notes (Signed)
   11/26/20 2148  Psych Admission Type (Psych Patients Only)  Admission Status Voluntary  Psychosocial Assessment  Patient Complaints Anxiety  Eye Contact Brief  Facial Expression Flat  Affect Appropriate to circumstance  Speech Logical/coherent  Interaction Assertive  Motor Activity Slow  Appearance/Hygiene Unremarkable  Behavior Characteristics Appropriate to situation  Mood Pleasant  Thought Process  Coherency Concrete thinking  Content WDL  Delusions None reported or observed  Perception Hallucinations  Hallucination None reported or observed  Judgment Impaired  Confusion None  Danger to Self  Current suicidal ideation? Denies  Danger to Others  Danger to Others None reported or observed
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
## 2020-11-27 LAB — GLUCOSE, CAPILLARY
Glucose-Capillary: 101 mg/dL — ABNORMAL HIGH (ref 70–99)
Glucose-Capillary: 108 mg/dL — ABNORMAL HIGH (ref 70–99)
Glucose-Capillary: 116 mg/dL — ABNORMAL HIGH (ref 70–99)

## 2020-11-27 MED ORDER — BENZTROPINE MESYLATE 0.5 MG PO TABS
0.5000 mg | ORAL_TABLET | Freq: Two times a day (BID) | ORAL | Status: DC
  Administered 2020-11-27 – 2020-12-05 (×16): 0.5 mg via ORAL
  Filled 2020-11-27 (×21): qty 1

## 2020-11-27 MED ORDER — BENZTROPINE MESYLATE 0.5 MG PO TABS
0.5000 mg | ORAL_TABLET | Freq: Once | ORAL | Status: AC
  Administered 2020-11-27: 0.5 mg via ORAL
  Filled 2020-11-27: qty 1

## 2020-11-27 NOTE — Plan of Care (Signed)
Nurse discussed coping skills with patient.  

## 2020-11-27 NOTE — [facility] Group Notes (Signed)
[facility] LCSW Group Therapy Note  11/27/2020    Type of Therapy and Topic:  Group Therapy:  Adding Supports Including Yourself  Participation Level:  Active   Description of Group:   Patients in this group were introduced to the concept that additional supports including self-support are an essential part of recovery.  Patients listed their current healthy and unhealthy supports, and discussed the difference between the two.   Several songs were played and a group discussion ensued in which patients stated they could relate to the songs which inspired them to realize they have be willing to help themselves in order to succeed, because other people cannot achieve sobriety or stability for them.  Parents were encouraged toward self-advocacy and self-support as part of their recovery.  They discussed their reactions to these songs' messages, which were positive and hopeful.  Before group ended, they identified the supports they believe they need to add to their lives to achieve their goals at discharge.   Therapeutic Goals: 1)  explain the difference between healthy and unhealthy supports and discuss what specific supports are currently in patients' lives 2)  demonstrate the importance of being a key part of one's own support system 3)  discuss the need for appropriate boundaries with supports 4)  elicit ideas from patients about supports that need to be added in order to achieve goals   Summary of Patient Progress:   The patient was not present at the beginning of group to list healthy/unhealthy supports.  During group, he took an active role in trying to calm another patient.    Therapeutic Modalities:   Motivational Interviewing Activity  Lynnell Chad

## 2020-11-27 NOTE — Progress Notes (Signed)
[facility] MD Progress Note  11/27/2020 12:31 PM Alan Rangel  MRN:  [id number] Subjective:   Alan Rangel is a 81 YOM with a PMHx of HIV and DM and a PPHx of MDD with psychotic features, schizoaffective disorder (bipolar vs depressed type) presenting voluntarily for command AH and SI with a plan.   24 hr events: no documented behavioral issues, no PRN medications given for agitation.  On interview this morning, patient appears improved compared to when this interviewer last saw the patient (2 days ago). Patient was seen on two occasions conversing appropriately with other patients, potentially  indicating a decrease in his paranoia. Pt also answers common sense questions in a more reasonable manner. Unfortunately patient is still subjectively distressed. He states that he saw a blue figure in his room early this morning saying "die Derral" and that he (the patient) ran out of the room. Pt reports involuntary jaw shaking as well as fidgeting hands. He endorses restlessness.   Pt reports passive SI when asked. He contracts for safety. ROS as below.    Principal Problem: MDD (major depressive disorder), recurrent, severe, with psychosis (HCC) Diagnosis: Principal Problem:   MDD (major depressive disorder), recurrent, severe, with psychosis (HCC) Active Problems:   HIV INFECTION   PTSD (post-traumatic stress disorder)   Schizoaffective disorder (HCC)  Total Time spent in direct patient care: 1 hour  Past Psychiatric History: as above  Past Medical History:  Past Medical History:  Diagnosis Date   Depression    Episodic mood disorder (HCC)    Generalized anxiety disorder    HIV (human immunodeficiency virus infection) (HCC)    HTN (hypertension)    Panic attack    Paranoid schizophrenia (HCC)    Personality disorder (HCC)    Schizoaffective disorder, bipolar type (HCC)     Past Surgical History:  Procedure Laterality Date   SKIN GRAFT Right    foot/leg   Family History:  Family History   Problem Relation Age of Onset   Sinusitis Mother    Mental illness Father    Hypertension Maternal Grandmother    Diabetes Maternal Grandmother    Family Psychiatric  History: negative Social History:  Social History   Substance and Sexual Activity  Alcohol Use No   Alcohol/week: 0.0 standard drinks   Comment: History of, but not currently     Social History   Substance and Sexual Activity  Drug Use No    Social History   Socioeconomic History   Marital status: Single    Spouse name: Not on file   Number of children: 0   Years of education: college   Highest education level: Not on file  Occupational History   Occupation: UNEMPLOYED    Employer: UNEMPLOYED   Occupation: Consulting civil engineer  Tobacco Use   Smoking status: Every Day    Packs/day: 0.50    Years: 10.00    Pack years: 5.00    Types: Cigarettes   Smokeless tobacco: Never   Tobacco comments:    would like patches  Vaping Use   Vaping Use: Never used  Substance and Sexual Activity   Alcohol use: No    Alcohol/week: 0.0 standard drinks    Comment: History of, but not currently   Drug use: No   Sexual activity: Never    Partners: Female, Male    Birth control/protection: Condom    Comment: pt given condoms 08/2020  Other Topics Concern   Not on file  Social History Narrative   Not  on file   Social Determinants of Health   Financial Resource Strain: Not on file  Food Insecurity: Not on file  Transportation Needs: Not on file  Physical Activity: Not on file  Stress: Not on file  Social Connections: Not on file   Additional Social History:     Sleep: Fair  Appetite:  Fair  Current Medications: Current Facility-Administered Medications  Medication Dose Route Frequency Provider Last Rate Last Admin   acetaminophen (TYLENOL) tablet 650 mg  650 mg Oral Q6H PRN Antonieta Pertlary, Greg Lawson, MD       alum & mag hydroxide-simeth (MAALOX/MYLANTA) 200-200-20 MG/5ML suspension 30 mL  30 mL Oral Q4H PRN Antonieta Pertlary, Greg  Lawson, MD       amLODipine (NORVASC) tablet 10 mg  10 mg Oral Daily Antonieta Pertlary, Greg Lawson, MD   10 mg at 11/27/20 0749   atorvastatin (LIPITOR) tablet 10 mg  10 mg Oral Daily Antonieta Pertlary, Greg Lawson, MD   10 mg at 11/27/20 0749   clonazePAM (KLONOPIN) tablet 0.5 mg  0.5 mg Oral BID Antonieta Pertlary, Greg Lawson, MD   0.5 mg at 11/27/20 0753   dolutegravir (TIVICAY) tablet 50 mg  50 mg Oral Daily Antonieta Pertlary, Greg Lawson, MD   50 mg at 11/27/20 0748   emtricitabine-tenofovir AF (DESCOVY) 200-25 MG per tablet 1 tablet  1 tablet Oral Daily Antonieta Pertlary, Greg Lawson, MD   1 tablet at 11/27/20 0748   hydrOXYzine (ATARAX/VISTARIL) tablet 25 mg  25 mg Oral TID PRN Antonieta Pertlary, Greg Lawson, MD       magnesium hydroxide (MILK OF MAGNESIA) suspension 30 mL  30 mL Oral Daily PRN Antonieta Pertlary, Greg Lawson, MD       nicotine (NICODERM CQ - dosed in mg/24 hours) patch 14 mg  14 mg Transdermal Daily Antonieta Pertlary, Greg Lawson, MD   14 mg at 11/27/20 0750   oxyCODONE-acetaminophen (PERCOCET/ROXICET) 5-325 MG per tablet 1-2 tablet  1-2 tablet Oral Q6H PRN Antonieta Pertlary, Greg Lawson, MD       PARoxetine (PAXIL) tablet 10 mg  10 mg Oral Daily Carlyn ReichertGabrielle, Abdulkarim Eberlin, MD   10 mg at 11/27/20 0749   [START ON 11/30/2020] Semaglutide(0.25 or 0.5MG /DOS) Namon CirriSOPN 0.5 mg  0.5 mg Injection Weekly Ladona Ridgelaylor, Cody W, PA-C       traZODone (DESYREL) tablet 50 mg  50 mg Oral QHS PRN Antonieta Pertlary, Greg Lawson, MD   50 mg at 11/26/20 2125   ziprasidone (GEODON) capsule 40 mg  40 mg Oral BID WC Carlyn ReichertGabrielle, Stacie Templin, MD   40 mg at 11/27/20 [id number]    Lab Results:  Results for orders placed or performed during the hospital encounter of 11/24/20 (from the past 48 hour(s))  Glucose, capillary     Status: None   Collection Time: 11/26/20  5:50 AM  Result Value Ref Range   Glucose-Capillary 90 70 - 99 mg/dL    Comment: Glucose reference range applies only to samples taken after fasting for at least 8 hours.  Basic metabolic panel     Status: Abnormal   Collection Time: 11/26/20  6:34 AM  Result Value Ref Range   Sodium  141 135 - 145 mmol/L   Potassium 4.0 3.5 - 5.1 mmol/L   Chloride 108 98 - 111 mmol/L   CO2 26 22 - 32 mmol/L   Glucose, Bld 113 (H) 70 - 99 mg/dL    Comment: Glucose reference range applies only to samples taken after fasting for at least 8 hours.   BUN 11 6 - 20 mg/dL  Creatinine, Ser 1.24 0.61 - 1.24 mg/dL   Calcium 8.8 (L) 8.9 - 10.3 mg/dL   GFR, Estimated >71 >06 mL/min    Comment: (NOTE) Calculated using the CKD-EPI Creatinine Equation (2021)    Anion gap 7 5 - 15    Comment: Performed at [facility], [address], [place], [place] [address]  Glucose, capillary     Status: Abnormal   Collection Time: 11/27/20  6:12 AM  Result Value Ref Range   Glucose-Capillary 101 (H) 70 - 99 mg/dL    Comment: Glucose reference range applies only to samples taken after fasting for at least 8 hours.   Comment 1 Notify RN    Comment 2 Document in Chart   Glucose, capillary     Status: Abnormal   Collection Time: 11/27/20 11:39 AM  Result Value Ref Range   Glucose-Capillary 108 (H) 70 - 99 mg/dL    Comment: Glucose reference range applies only to samples taken after fasting for at least 8 hours.    Blood Alcohol level:  Lab Results  Component Value Date   ETH <10 11/23/2020   ETH <10 05/08/2017    Metabolic Disorder Labs: Lab Results  Component Value Date   HGBA1C 5.9 (H) 11/25/2020   MPG 122.63 11/25/2020   MPG 117 (H) 06/17/2013   No results found for: PROLACTIN Lab Results  Component Value Date   CHOL 153 11/25/2020   TRIG 99 11/25/2020   HDL 30 (L) 11/25/2020   CHOLHDL 5.1 11/25/2020   VLDL 20 11/25/2020   LDLCALC 103 (H) 11/25/2020   LDLCALC 97 04/13/2020    Physical Findings: AIMS: 2 for jaw movements CIWA:    COWS:     Musculoskeletal: Strength & Muscle Tone: within normal limits Gait & Station: normal Patient leans: N/A  Psychiatric Specialty Exam:  Presentation  General Appearance: Casual  Eye Contact:Fair  Speech:Clear and  Coherent  Speech Volume:Decreased  Handedness:Right   Mood and Affect  Mood:Anxious  Affect:Congruent   Thought Process  Thought Processes:Linear (occasional thought blocking)  Descriptions of Associations:Intact  Orientation:Full (Time, Place and Person)  Thought Content:Logical , paranoid  History of Schizophrenia/Schizoaffective disorder:Yes  Duration of Psychotic Symptoms:Greater than six months  Hallucinations:Hallucinations: Command Description of Command Hallucinations: tell pt to commit suicide Description of Auditory Hallucinations: blue figure saying "die [employer] of Reference:None  Suicidal Thoughts:Suicidal Thoughts: Yes, Passive (contracts for safety) SI Passive Intent and/or Plan: Without Intent; Without Means to Carry Out; Without Plan  Homicidal Thoughts:Homicidal Thoughts: No   Sensorium  Memory:Immediate Fair; Recent Fair; Remote Fair  Judgment:Poor  Insight:Poor   Executive Functions  Concentration:Fair  Attention Span:Fair  Recall:Fair  Fund of Knowledge:Fair  Language:Fair   Psychomotor Activity  Psychomotor Activity:Psychomotor Activity: Increased; Restlessness   Assets  Assets:Housing; Communication Skills   Sleep  Sleep:Sleep: Fair Number of Hours of Sleep: 5.75   Physical Exam Constitutional:      Appearance: He is obese.  HENT:     Head: Normocephalic and atraumatic.  Eyes:     Extraocular Movements: Extraocular movements intact.  Cardiovascular:     Rate and Rhythm: Normal rate and regular rhythm.  Pulmonary:     Effort: Pulmonary effort is normal.  Musculoskeletal:        General: Normal range of motion.  Neurological:     General: No focal deficit present.     Mental Status: He is alert.   Review of Systems  Respiratory:  Negative for shortness of  breath.   Cardiovascular:  Negative for chest pain.  Neurological:  Negative for headaches.  Blood pressure (!) 143/93, pulse 75, temperature 98.9  F (37.2 C), temperature source Oral, resp. rate 16, height 6\' 1"  (1.854 m), weight (!) 149.7 kg, SpO2 98 %. Body mass index is 43.54 kg/m.   Safety and Monitoring: -- VOLUNTARY admission to inpatient psychiatric unit for safety, stabilization and treatment -- Daily contact with patient to assess and evaluate symptoms and progress in treatment -- Patient's case to be discussed in multi-disciplinary team meeting -- Observation Level : q15 minute checks -- Vital signs:  q12 hours -- Precautions: suicide  Psychosis, PPHx of Schizoaffective d/o and MDD w psychotic features Pt is paranoid with disordered thought, denying AH (7/15) Pt appears to have reduced paranoia and more logical and coherent thought process (7/17) AP labs: A1C 5.9, lipids with LDL 103 HDL 30 (on atorvastatin), EKG w sinus brady, Qtc of 400 Reports taking his Geodon once per day, unclear if with food -Patient received Geodon 40 mg daily 7/14 -Continue Geodon 40 mg bid (started 7/15)  Repeat EKG 7/19 -Start cogentin 0.5 mg bid for jaw movements and consider propranolol for akathisia  Anxiety and depression -Klonopin 0.5 mg bid for anxiety and panic symptoms (patient takes PRN as OP) -Paxil 5 mg (started 7/14), increased to 10 mg 7/16 given good tolerability  Tobacco use disorder -Nicoderm patch 14 mg  Medical management  HIV: continue home meds: Tivicay 50 mg PO daily, Descovy 200-25 mg PO daily -Last VL undetectable, last CD4 1,700 (08/2020) -Recheck labs given recent psychiatric problems and questionable compliance -VL and CD4 pending -Monitor for signs of infection  DM: continue semaglutide injection weekly (last dose 7/13), well controlled with A1C of 5.9 (technically does not meet criteria for DM) -Monitor with daily AM POC glucose  Admission labs:  BMP w Cr of 1.32 on admission, 1.24 (7/17)  CBC wnl  UDS with benzos (prescribed) and THC   Treatment Plan Summary: Daily contact with patient to assess and  evaluate symptoms and progress in treatment and Medication management   06[birth year]19-1979 PGY-1, Psychiatry

## 2020-11-27 NOTE — Progress Notes (Signed)
EKG results placed on the outside of pt's shadow chart   Normal sinus rhythm  Normal ECG  QT/Qtc    406/431 ms

## 2020-11-27 NOTE — Progress Notes (Signed)
Pt was using wheelchair at beginning of shift due to having played basketball today.  Pt had been in a great deal of pain per day shift and did receive Tylenol for this at that time.  Pt asked to go back to using his walker shortly after shift change and denied pain when receiving night time medications.      11/27/20 2259  Psych Admission Type (Psych Patients Only)  Admission Status Voluntary  Psychosocial Assessment  Patient Complaints Anxiety;Insomnia  Eye Contact Brief  Facial Expression Flat  Affect Appropriate to circumstance  Speech Logical/coherent  Interaction Assertive  Motor Activity Slow (utilizing walker)  Appearance/Hygiene Unremarkable  Behavior Characteristics Appropriate to situation  Mood Anxious;Pleasant  Thought Process  Coherency Concrete thinking  Content WDL  Delusions WDL  Perception Hallucinations  Hallucination Command;Auditory  Judgment Impaired  Confusion None  Danger to Self  Current suicidal ideation? Denies  Danger to Others  Danger to Others None reported or observed
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
## 2020-11-27 NOTE — Progress Notes (Signed)
Pt came to the NS and brought their wheelchair to staff. Pt stated that they no longer needed the wheelchair and that they will use their walker instead.

## 2020-11-27 NOTE — [facility] Group Notes (Signed)
Patient participated in group and was attentive. 

## 2020-11-27 NOTE — [facility] Group Notes (Signed)
10,learning to help, yes

## 2020-11-28 LAB — GLUCOSE, CAPILLARY: Glucose-Capillary: 95 mg/dL (ref 70–99)

## 2020-11-28 LAB — HIV-1 RNA QUANT-NO REFLEX-BLD
HIV 1 RNA Quant: <20 copies/mL
LOG10 HIV-1 RNA: UNDETERMINED log10copy/mL

## 2020-11-28 MED ORDER — LOPERAMIDE HCL 2 MG PO CAPS
ORAL_CAPSULE | ORAL | Status: AC
  Filled 2020-11-28: qty 1

## 2020-11-28 MED ORDER — ESCITALOPRAM OXALATE 5 MG PO TABS
5.0000 mg | ORAL_TABLET | Freq: Every day | ORAL | Status: DC
  Administered 2020-11-28 – 2020-12-01 (×4): 5 mg via ORAL
  Filled 2020-11-28 (×7): qty 1

## 2020-11-28 MED ORDER — LOPERAMIDE HCL 2 MG PO CAPS
2.0000 mg | ORAL_CAPSULE | Freq: Two times a day (BID) | ORAL | Status: DC | PRN
  Administered 2020-11-28: 2 mg via ORAL

## 2020-11-28 MED ORDER — LOPERAMIDE HCL 2 MG PO CAPS
2.0000 mg | ORAL_CAPSULE | Freq: Three times a day (TID) | ORAL | Status: DC | PRN
  Administered 2020-11-29: 2 mg via ORAL
  Filled 2020-11-28: qty 1

## 2020-11-28 MED ORDER — PROPRANOLOL HCL 20 MG PO TABS
20.0000 mg | ORAL_TABLET | Freq: Two times a day (BID) | ORAL | Status: DC
  Filled 2020-11-28 (×3): qty 1

## 2020-11-28 MED ORDER — PROPRANOLOL HCL 10 MG PO TABS
10.0000 mg | ORAL_TABLET | Freq: Once | ORAL | Status: AC
  Administered 2020-11-28: 10 mg via ORAL
  Filled 2020-11-28: qty 1

## 2020-11-28 NOTE — Progress Notes (Signed)
[facility] Group Notes:  (Nursing/MHT/Case Management/Adjunct)  Date:  11/28/2020  Time:  10:15 PM  Type of Therapy:  Psychoeducational Skills  Participation Level:  Active  Participation Quality:  Appropriate, Sharing, and Supportive  Affect:  Appropriate  Cognitive:  Alert, Appropriate, and Oriented  Insight:  Appropriate and Good  Engagement in Group:  Engaged  Modes of Intervention:  Problem-solving  Summary of Progress/Problems: Isidoro shared with the group on tonight how he was doing better than he was prior to admission.  He shared how he was able to get some sleep due to him not sleeping at all when he was at home.  He continue to share with the group how he will be talking with his SW which is his support system and a plan will be made.    Annell Greening [place] 11/28/2020, 10:15 PM

## 2020-11-28 NOTE — Plan of Care (Signed)
  Problem: Education: Goal: Ability to state activities that reduce stress will improve Outcome: Progressing   Problem: Coping: Goal: Ability to identify and develop effective coping behavior will improve Outcome: Progressing   Problem: Self-Concept: Goal: Level of anxiety will decrease Outcome: Progressing   
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
## 2020-11-28 NOTE — Progress Notes (Signed)
Recreation Therapy Notes  Date: 7.18.22 Time: 0930 Location: [address] Dayroom   Group Topic: Stress Management   Goal Area(s) Addresses: Patient will actively participate in stress management techniques presented during session. Patient will successfully identify benefit of practicing stress management post d/c.   Behavioral Response: Appropriate   Intervention: Guided exercise with ambient sound and script   Activity : Meditation   LRT provided education and instruction for the stress management technique of meditation.  Patients were asked to participate in the technique introduced during session.  LRT left the floor open for any questions or concerns of the patients.  Patients were given suggestions of ways to access scripts post d/c and encouraged to explore Youtube and other apps available on smartphones, tablets, and computers.   Education:  Stress Management, Discharge Planning.   Education Outcome: Acknowledges education   Clinical Observations/Feedback: Patient actively engaged in technique introduced, expressed no concerns at conclusion of group session.       Caroll Rancher, LRT/CTRS         Caroll Rancher A 11/28/2020 12:30 PM

## 2020-11-28 NOTE — Progress Notes (Signed)
Alan Rangel was in the day room much of the evening.  He interacts well with staff and peers.  He reported passive SI this afternoon but currently denies.  He denied HI or AVH.  He verbally agrees to not harm himself on the unit.  Discussed utilizing relaxation apps on his phone to help with his anxiety.  "We had a group the other day about this and it was really helpful."  He took trazodone and tylenol at bedtime.  He recently was up and the nurses station to check the time but promptly returned to his room.  Q 15 minute checks maintained for safety.    11/28/20 2135  Psych Admission Type (Psych Patients Only)  Admission Status Voluntary  Psychosocial Assessment  Patient Complaints Anxiety;Depression;Insomnia  Eye Contact Fair  Facial Expression Sad  Affect Appropriate to circumstance  Speech Logical/coherent  Interaction Assertive  Motor Activity Slow  Appearance/Hygiene Unremarkable  Behavior Characteristics Anxious  Mood Anxious  Thought Process  Coherency Concrete thinking  Content WDL  Delusions WDL  Perception WDL  Hallucination None reported or observed  Judgment Impaired  Confusion None  Danger to Self  Current suicidal ideation? Denies  Danger to Others  Danger to Others None reported or observed

## 2020-11-28 NOTE — Plan of Care (Signed)
Nurse discussed anxiety, depression and coping skills with patient.  

## 2020-11-28 NOTE — [facility] Group Notes (Signed)
LCSW Group Therapy Note        11/28/2020:  1pm-2pm                 Type of Therapy and Topic:  Group Therapy: Establishing Boundaries     Participation Level:  Active      Description of Group:     In this group, patients learned how to define boundaries, discussed the different types or boundaries with examples.  They identified times that boundaries had been violated and how they reacted.  They analyzed how their reaction was possibly beneficial and how it was possibly unhelpful.  The group discussed how to set boundaries, respect others boundaries and communicate their boundaries. The group utilized a role play scenarios (working with a partner) and discussed how each person in the scenario could have reacted differently and what boundaries they need to implement to improve their life. Patients also discussed consequences to overstepping boundaries and lack of boundaries. Patients discussed how to establish boundaries with clear consequences. Patients will explore discussion questions that address media influence and why it is hard to set boundaries.         Therapeutic Goals:   Patients will define boundaries and explore (physical, personal space and language boundaries). Patients will remember their last incident where their boundaries were violated and how they behaved. Patients will practice empathy and understanding of other's boundaries and learn from others in group. Patients will explore how they may have crossed another person's boundaries in the past.   Patients will learn healthy ways to set and communicate boundaries. Patients will actively engage in group activity utilizing role play and critical thinking skills.       Summary of Patient Progress:  Patient was engaged and participated in the group session. Patient left halfway through group. Patient provided examples of setting boundaries with finances and offered advice about ways that he sets personal boundaries with  himself and money including budgeting and only loaning money he is willing to lose.        Therapeutic Modalities:    Cognitive Behavioral Therapy      Alan Perri, LCSW, LCAS Clincal Social Worker [facility]

## 2020-11-29 DIAGNOSIS — F333 Major depressive disorder, recurrent, severe with psychotic symptoms: Secondary | ICD-10-CM

## 2020-11-29 LAB — T-HELPER CELLS (CD4) COUNT (NOT AT ARMC)
CD4 % Helper T Cell: 49 % (ref 33–65)
CD4 T Cell Abs: 1724 /uL (ref 400–1790)

## 2020-11-29 LAB — GLUCOSE, CAPILLARY: Glucose-Capillary: 93 mg/dL (ref 70–99)

## 2020-11-29 MED ORDER — GABAPENTIN 100 MG PO CAPS
100.0000 mg | ORAL_CAPSULE | Freq: Every day | ORAL | Status: DC
  Administered 2020-11-29 – 2020-12-01 (×3): 100 mg via ORAL
  Filled 2020-11-29 (×7): qty 1

## 2020-11-29 MED ORDER — PROPRANOLOL HCL 10 MG PO TABS: 10.0000 mg | ORAL_TABLET | Freq: Two times a day (BID) | ORAL | Status: DC

## 2020-11-29 MED ORDER — ZIPRASIDONE HCL 60 MG PO CAPS
60.0000 mg | ORAL_CAPSULE | Freq: Two times a day (BID) | ORAL | Status: DC
  Administered 2020-11-29 – 2020-12-01 (×5): 60 mg via ORAL
  Filled 2020-11-29 (×10): qty 1

## 2020-11-29 MED ORDER — PROPRANOLOL HCL 10 MG PO TABS
10.0000 mg | ORAL_TABLET | Freq: Once | ORAL | Status: DC
  Filled 2020-11-29: qty 1

## 2020-11-29 NOTE — [facility] Suicide Risk Assessment (Signed)
[facility] INPATIENT:  Family/Significant Other Suicide Prevention Education  Suicide Prevention Education:  Education Completed; [facility] case manager Ms. Tenny Craw [phone], has been identified by the patient as the family member/significant other with whom the patient will be residing, and identified as the person(s) who will aid the patient in the event of a mental health crisis (suicidal ideations/suicide attempt).  With written consent from the patient, the family member/significant other has been provided the following suicide prevention education, prior to the and/or following the discharge of the patient.   CSW spoke with this pt's case manager who shared she has never experienced this patient becoming symptomatic before. She is unsure what triggered this pt, however states she plans to find out what has changed in his life. Pt will follow up with her when he discharges.   The suicide prevention education provided includes the following: Suicide risk factors Suicide prevention and interventions National Suicide Hotline telephone number [facility] assessment telephone number [facility] Emergency Assistance 911 [facility] and/or Residential Mobile Crisis Unit telephone number  Request made of family/significant other to: Remove weapons (e.g., guns, rifles, knives), all items previously/currently identified as safety concern.   Remove drugs/medications (over-the-counter, prescriptions, illicit drugs), all items previously/currently identified as a safety concern.  The family member/significant other verbalizes understanding of the suicide prevention education information provided.  The family member/significant other agrees to remove the items of safety concern listed above.  Hayzlee Mcsorley A Lamiah Marmol 11/29/2020, 3:28 PM

## 2020-11-29 NOTE — Progress Notes (Signed)
Recreation Therapy Notes  Animal-Assisted Activity (AAA) Program Checklist/Progress Notes Patient Eligibility Criteria Checklist & Daily Group note for Rec Tx Intervention  Date: 7.19.22 Time: 1430 Location: [address]   AAA/T Program Assumption of Risk Form signed by Engineer, production or Parent Legal Guardian  YES   Patient is free of allergies or severe asthma  YES   Patient reports no fear of animals  YES  Patient reports no history of cruelty to animals  YES  Patient understands his/her participation is voluntary  YES  Patient washes hands before animal contact YES   Patient washes hands after animal contact YES  Education: Charity fundraiser, Appropriate Animal Interaction   Education Outcome: Acknowledges understanding/In group clarification offered/Needs additional education.   Clinical Observations/Feedback: Pt did not attend group session.    Caroll Rancher, LRT/CTRS        Caroll Rancher A 11/29/2020 3:36 PM
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
## 2020-11-29 NOTE — [facility] Group Notes (Addendum)
Pt did not attend wrap up group this evening.  

## 2020-11-29 NOTE — Progress Notes (Addendum)
Pt visible in hall conversing with peers in line for supper. Affect is congruent, he's logical on interactions. Ambulatory in milieu with his walker. Denies SI, HI and AVH. Report some discomfort in right lower extremity "just when I move. I've been asleep in my bed, I'm ok right now" but denies respiratory distress, chest pains or dizziness thus far. Vitals are WNL with 02 sat at 97% and pedal pulses are present when palpated. Pt tolerates all meals and medications well without discomfort thus far.  Emotional support and encouragement offered to pt. Q 15 minutes safety checks maintained without self harm gestures. Pt informed of of scheduled ultrasound appointment at 1000 tomorrow morning to rule out DVT.  All medications given with verbal education and effects monitored.  Pt is in agreement of ultrasound appointment as scheduled. Denies concerns at this time.

## 2020-11-29 NOTE — [facility] Group Notes (Signed)
Adult Psychoeducational Group Note  Date:  11/29/2020 Time:  4:18 PM  Group Topic/Focus:  Goals Group:   The focus of this group is to help patients establish daily goals to achieve during treatment and discuss how the patient can incorporate goal setting into their daily lives to aide in recovery.  Participation Level:  Active  Participation Quality:  Appropriate  Affect:  Appropriate  Cognitive:  Appropriate  Insight: Appropriate  Engagement in Group:  Engaged  Modes of Intervention:  Discussion  Additional Comments:  Patient attended morning goal and orientation group and participated.    Earnestine Tuohey W Adalea Handler 11/29/2020, 4:18 PM

## 2020-11-29 NOTE — Plan of Care (Signed)
Contacted ED attending (Dr. Freida Busman) and charge nurse and gave notice that Patient Alan Rangel will be transported to the ED from [facility] tonight for DVT w/u. See same day progress note for more details.   Carlyn Reichert PGY-1, Psychiatry

## 2020-11-29 NOTE — [facility] Suicide Risk Assessment (Signed)
[facility] INPATIENT:  Family/Significant Other Suicide Prevention Education  Suicide Prevention Education:  Contact Attempts: [facility] case manager Ms. Tenny Craw [phone],  has been identified by the patient as the family member/significant other with whom the patient will be residing, and identified as the person(s) who will aid the patient in the event of a mental health crisis.  With written consent from the patient, two attempts were made to provide suicide prevention education, prior to and/or following the patient's discharge.  We were unsuccessful in providing suicide prevention education.  A suicide education pamphlet was given to the patient to share with family/significant other.  Date and time of first attempt: 11/29/2020 / 1:40pm CSW was unable to reach this pt's case manager Date and time of second attempt: Second attempt still needs to be made.  [name] A [name] 11/29/2020, 1:46 PM

## 2020-11-30 ENCOUNTER — Ambulatory Visit (HOSPITAL_COMMUNITY)
Admission: RE | Admit: 2020-11-30 | Discharge: 2020-11-30 | Disposition: A | Discharge status: Home / Self Care | Payer: Medicare HMO | Source: Ambulatory Visit | Attending: Psychiatry | Admitting: Psychiatry

## 2020-11-30 ENCOUNTER — Encounter (HOSPITAL_COMMUNITY): Payer: Medicare HMO

## 2020-11-30 DIAGNOSIS — M7989 Other specified soft tissue disorders: Secondary | ICD-10-CM | POA: Insufficient documentation

## 2020-11-30 DIAGNOSIS — R600 Localized edema: Secondary | ICD-10-CM

## 2020-11-30 LAB — RENAL FUNCTION PANEL
Albumin: 4.3 g/dL (ref 3.5–5.0)
Anion gap: 9 (ref 5–15)
BUN: 12 mg/dL (ref 6–20)
CO2: 26 mmol/L (ref 22–32)
Calcium: 8.9 mg/dL (ref 8.9–10.3)
Chloride: 105 mmol/L (ref 98–111)
Creatinine, Ser: 1.22 mg/dL (ref 0.61–1.24)
GFR, Estimated: >60 mL/min (ref >60)
Glucose, Bld: 129 mg/dL — ABNORMAL HIGH (ref 70–99)
Phosphorus: 3.8 mg/dL (ref 2.5–4.6)
Potassium: 3.8 mmol/L (ref 3.5–5.1)
Sodium: 140 mmol/L (ref 135–145)

## 2020-11-30 LAB — GLUCOSE, CAPILLARY: Glucose-Capillary: 84 mg/dL (ref 70–99)

## 2020-11-30 MED ORDER — TRAZODONE HCL 50 MG PO TABS: 25.0000 mg | ORAL_TABLET | Freq: Every evening | ORAL | Status: DC | PRN

## 2020-11-30 MED ORDER — OXYCODONE-ACETAMINOPHEN 5-325 MG PO TABS: 1.0000 | ORAL_TABLET | Freq: Three times a day (TID) | ORAL | Status: DC | PRN

## 2020-11-30 MED ORDER — HOME MED STORE IN PYXIS: 1.0000 | Status: DC

## 2020-11-30 MED ORDER — SEMAGLUTIDE(0.25 OR 0.5MG/DOS) 2 MG/1.5ML ~~LOC~~ SOPN: 0.5000 mg | PEN_INJECTOR | SUBCUTANEOUS | Status: DC

## 2020-11-30 MED ORDER — HYDRALAZINE HCL 25 MG PO TABS: 25.0000 mg | ORAL_TABLET | Freq: Three times a day (TID) | ORAL | Status: DC | PRN

## 2020-11-30 NOTE — [facility] Group Notes (Signed)
Type of Therapy and Topic:  Group Therapy:  Building Supports   Participation Level:  Active    Description of Group:  Patients in this group were introduced to the idea of adding a variety of healthy supports to address the various needs in their lives.  Different types of support were defined and described, and patients were asked to act out what each type could be.  Patients discussed what additional healthy supports could be helpful in their recovery and wellness after discharge in order to prevent future hospitalizations.   An emphasis was placed on following up with the discharge plan when they leave the hospital in order to continue becoming healthier and happier.   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 The patient expressed comprehension of the concepts presented and actively participated in group.    Therapeutic Modalities:   Motivational Interviewing Brief Solution-Focused Therapy

## 2020-11-30 NOTE — Consult Note (Addendum)
Medical Consultation  Alan Rangel VFI:[id number] DOB: 1975-10-18 DOA: 11/24/2020 PCP: [facility]   Requesting physician: Dr. Jola Babinski Date of consultation: 11/30/20 Reason for consultation: BLE edema, RLE pain  Impression/Recommendations BLE edema RLE pain     - he has uniform edema of his lower extremities     - b/l dopplers were obtained and they were negative     - his edema is non-pitting has had the appearance of more of a lymphedema     - it is possible that amlodipine is playing a role, but he states he's chronically on this med (though not always compliant) and his problem showed up recently     - with clots r/o'd; I recommend compression stockings, salt restriction, elevated legs while resting     - we can change his BP meds so as not to exacerbate his issue; renal function was normal 4 days ago; but let's repeat that. He had norvasc this morning and his BP is good. Will d/c. Have PRN hydralazine on order and make final BP medication decision after labs have returned; could potentially use a diuretic here for BP control and the edema     - he is seen walking about the unit with his walker unassisted w/o pain or guarding; we will hold off on any further work up     - agree with a trial of gabapentin     - to be complete, will also check BNP  HTN     - d/c norvasc     - PRN hydralazine for now     - final recs for chronic meds after rpt labs  Remainder per primary.   TR[facility] will follow-up again tomorrow. Please contact me if I can be of assistance in the meanwhile. Thank you for this consultation.  Chief Complaint: BLE edema  HPI:  Alan Rangel is a 45 y.o. male with medical history significant of DM, HIV, HTN. Presenting with auditory hallucinations and SI.   As per [facility] H&P:  Patient is a 45 year old male with a past psychiatric history significant for schizoaffective disorder; bipolar type versus schizoaffective disorder; depressive type who presented to the [facility] emergency department on 11/23/2020 with suicidal ideation.  The patient stated that he was looking at his window at a bridge and was thinking about going in jumping off of it, but also considered walking into traffic.  He stated his auditory hallucinations were telling him to harm himself.  The patient reported that he was recently switched to Zoloft for depression and anxiety.  He had been on it approximately a month.  Things had not gone well with it.  The suicidal thoughts worsened on the morning of admission.  TRH was consulted for BLE edema and RLE pain. The patient reports that he's had BLE edema since he has been in [facility]. He denies any previous history of heart failure. He became acutely aware of his symptoms on Monday after playing basketball in the gym. After that session, he felt weak while walking like his legs were heavy and swollen. He says he felt pain in his toes that were like pin pricks. He reports that those symptoms have not improved over the last 2 days. He did not have any dyspnea, chest pain, or palpitations. Nursing/staff have reported that he has been seen moving with his walker without difficulty around the unit. Dopplers were obtained of his BLE. TRH was called for consultation.   Review of Systems: Denies CP,  dyspnea, palpitations, N/V/D, fever, weight gain. Remainder of ROS is negative for all not included in HPI.   Past Medical History:  Diagnosis Date   Depression    Episodic mood disorder (HCC)    Generalized anxiety disorder    HIV (human immunodeficiency virus infection) (HCC)    HTN (hypertension)    Panic attack    Paranoid schizophrenia (HCC)    Personality disorder (HCC)    Schizoaffective disorder, bipolar type (HCC)    Past Surgical History:  Procedure Laterality Date   SKIN GRAFT Right    foot/leg   Social History:  reports that he has been smoking cigarettes. He has a 5.00 pack-year smoking history. He has never used smokeless  tobacco. He reports that he does not drink alcohol and does not use drugs.  No Known Allergies Family History  Problem Relation Age of Onset   Sinusitis Mother    Mental illness Father    Hypertension Maternal Grandmother    Diabetes Maternal Grandmother     Prior to Admission medications   Medication Sig Start Date End Date Taking? Authorizing Provider  clonazePAM (KLONOPIN) 0.5 MG tablet Take 1 tablet (0.5 mg total) by mouth 2 (two) times daily as needed (Anxiety). 05/10/17   Money, Gerlene Burdock, FNP  dolutegravir (TIVICAY) 50 MG tablet Take 1 tablet by mouth daily. 09/06/20   Veryl Speak, FNP  emtricitabine-tenofovir AF (DESCOVY) 200-25 MG tablet Take 1 tablet by mouth daily. 09/06/20   Veryl Speak, FNP  OZEMPIC, 0.25 OR 0.5 MG/DOSE, 2 MG/1.5ML SOPN Inject 0.5 mg as directed once a week. 12/19/19   [provider]  sertraline (ZOLOFT) 100 MG tablet Take 100 mg by mouth daily. 09/28/20   [provider]  ziprasidone (GEODON) 40 MG capsule Take 40 mg by mouth daily. 09/28/20   [provider]  albuterol (VENTOLIN HFA) 108 (90 Base) MCG/ACT inhaler Inhale 1-2 puffs into the lungs every 6 (six) hours as needed for wheezing or shortness of breath. Patient not taking: Reported on 11/23/2020 01/20/19 11/23/20  Veryl Speak, FNP   Physical Exam: Blood pressure 122/73, pulse 71, temperature 98.2 F (36.8 C), temperature source Oral, resp. rate 20, height 6\' 1"  (1.854 m), weight (!) 149.7 kg, SpO2 91 %. Vitals:   11/30/20 0537 11/30/20 1634  BP: (!) 136/92 122/73  Pulse: 86 71  Resp:    Temp:    SpO2: 91%     General: 45 y.o. male resting in bed in NAD Eyes: PERRL, normal sclera ENMT: Nares patent w/o discharge, orophaynx clear, dentition normal, ears w/o discharge/lesions/ulcers Neck: Supple, trachea midline Cardiovascular: RRR, +S1, S2, no m/g/r, equal pulses throughout Respiratory: CTABL, no w/r/r, normal WOB GI: BS+, NDNT, no masses noted, no  organomegaly noted MSK: No c/c; b/l pedal edema 2+ non-pitting Skin: No rashes, bruises, ulcerations noted Neuro: A&O x 3, no focal deficits Psyc: Appropriate interaction and affect, calm/cooperative  Labs on Admission:  Basic Metabolic Panel: Recent Labs  Lab 11/26/20 0634  NA 141  K 4.0  CL 108  CO2 26  GLUCOSE 113*  BUN 11  CREATININE 1.24  CALCIUM 8.8*   Liver Function Tests: No results for input(s): AST, ALT, ALKPHOS, BILITOT, PROT, ALBUMIN in the last 168 hours. No results for input(s): LIPASE, AMYLASE in the last 168 hours. No results for input(s): AMMONIA in the last 168 hours. CBC: No results for input(s): WBC, NEUTROABS, HGB, HCT, MCV, PLT in the last 168 hours. Cardiac Enzymes: No results  for input(s): CKTOTAL, CKMB, CKMBINDEX, TROPONINI in the last 168 hours. BNP: Invalid input(s): POCBNP CBG: Recent Labs  Lab 11/27/20 1139 11/27/20 1739 11/28/20 0535 11/29/20 0546 11/30/20 0531  GLUCAP 108* 116* 95 93 84    Radiological Exams on Admission: VAS US LOWER EXTREMITY VENOUS (DVT)  Result Date: 11/30/2020  Lower Venous DVT Study Patient Name:  Alan Rangel  Date of Exam:   11/30/2020 Medical Rec #: [id number]    Accession #:    [id number] Date of Birth: 1976-02-18   Patient Gender: M Patient Age:   44Y [facility] Procedure:      VAS US LOWER EXTREMITY VENOUS (DVT) Referring Phys: [id number] Marlane MingleGREG LAWSON CLARY --------------------------------------------------------------------------------  Indications: Swelling.  Risk Factors: None identified. Limitations: Body habitus and poor ultrasound/tissue interface. Comparison Study: No prior studies. Performing Technologist: Chanda BusingGregory Collins RVT  Examination Guidelines: A complete evaluation includes B-mode imaging, spectral Doppler, color Doppler, and power Doppler as needed of all accessible portions of each vessel. Bilateral testing is considered an integral part of a complete examination. Limited  examinations for reoccurring indications may be performed as noted. The reflux portion of the exam is performed with the patient in reverse Trendelenburg.  +---------+---------------+---------+-----------+----------+--------------+ RIGHT    CompressibilityPhasicitySpontaneityPropertiesThrombus Aging +---------+---------------+---------+-----------+----------+--------------+ CFV      Full           Yes      Yes                                 +---------+---------------+---------+-----------+----------+--------------+ SFJ      Full                                                        +---------+---------------+---------+-----------+----------+--------------+ FV Prox  Full                                                        +---------+---------------+---------+-----------+----------+--------------+ FV Mid   Full                                                        +---------+---------------+---------+-----------+----------+--------------+ FV DistalFull                                                        +---------+---------------+---------+-----------+----------+--------------+ PFV      Full                                                        +---------+---------------+---------+-----------+----------+--------------+ POP      Full           Yes  Yes                                 +---------+---------------+---------+-----------+----------+--------------+ PTV      Full                                                        +---------+---------------+---------+-----------+----------+--------------+ PERO     Full                                                        +---------+---------------+---------+-----------+----------+--------------+   +---------+---------------+---------+-----------+----------+--------------+ LEFT     CompressibilityPhasicitySpontaneityPropertiesThrombus Aging  +---------+---------------+---------+-----------+----------+--------------+ CFV      Full           Yes      Yes                                 +---------+---------------+---------+-----------+----------+--------------+ SFJ      Full                                                        +---------+---------------+---------+-----------+----------+--------------+ FV Prox  Full                                                        +---------+---------------+---------+-----------+----------+--------------+ FV Mid   Full                                                        +---------+---------------+---------+-----------+----------+--------------+ FV DistalFull                                                        +---------+---------------+---------+-----------+----------+--------------+ PFV      Full                                                        +---------+---------------+---------+-----------+----------+--------------+ POP      Full           Yes      Yes                                 +---------+---------------+---------+-----------+----------+--------------+ PTV      Full                                                        +---------+---------------+---------+-----------+----------+--------------+  PERO     Full                                                        +---------+---------------+---------+-----------+----------+--------------+    Summary: RIGHT: - There is no evidence of deep vein thrombosis in the lower extremity.  - No cystic structure found in the popliteal fossa.  LEFT: - There is no evidence of deep vein thrombosis in the lower extremity.  - No cystic structure found in the popliteal fossa.  *See table(s) above for measurements and observations.    Preliminary     EKG: Independently reviewed. NSR, no st elevations  Time spent: 50 minutes  Refoel Palladino A Jaycie Kregel DO [facility]  If 7PM-7AM, please contact  night-coverage [email or web address] 11/30/2020, 5:02 PM

## 2020-11-30 NOTE — Progress Notes (Signed)
Recreation Therapy Notes  Date: 7.20.22 Time: 0930 Location: [address] Dayroom  Group Topic: Stress Management   Goal Area(s) Addresses:  Patient will actively participate in stress management techniques presented during session.  Patient will successfully identify benefit of practicing stress management post d/c.   Intervention: Guided exercise with ambient sound and script  Activity :Guided Imagery  LRT provided an introduction on practice of visualization via guided imagery. Patient was asked to participate in the technique introduced during session. Patients were given suggestions of ways to access scripts post d/c and encouraged to explore Youtube and other apps available on smartphones, tablets, and computers.   Education:  Stress Management, Discharge Planning.   Education Outcome: Acknowledges education  Clinical Observations/Feedback: Patient did not attend group session.     Caroll Rancher,  LRT/CTRS         Caroll Rancher A 11/30/2020 12:05 PM

## 2020-11-30 NOTE — Progress Notes (Addendum)
Pt denies SI, HI, AVH and pain when assessed this shift. Presents animated but preoccupied with edema in bilateral legs and possible outcomes / recommendations. Rates his anxiety 8/10, depression 7/10 and hopelessness 5/10 with current stressor being his bilateral legs edema "I'll be fine once I find out what's going on with my legs". Pt completed scheduled venous ultrasound procedure this shift without evident of DVT in bilateral lower extremities. Continues to ambulate safely in milieu with walker without difficulties. Pt's vitals are stable without respiratory distress, chest pain or confusion. Pulses in lower extremities are palpable and he's encouraged to elevate extremities when in bed relieve pressure and decrease edema in extremities. Pt's affect is bright, he's A & O X4 with logical speech on interactions. Attended scheduled groups on unit and is cooperative with care. Report he slept well last night with good appetite, normal energy and good concentration level.  Support and encouragement offered. All medications given as ordered with verbal education and effects monitored. Q 15 minutes safety checks maintained without self harm gestures or outburst to note thus far.  Pt remains medication compliant without adverse drug reactions. Tolerates meals and fluids without discomfort thus far.

## 2020-11-30 NOTE — [facility] Group Notes (Signed)
Spiritual care group on grief and loss facilitated by chaplain Dyanne Carrel, [facility]   Group Goal:   Support / Education around grief and loss   Members engage in facilitated group support and psycho-social education.   Group Description:   Following introductions and group rules, group members engaged in facilitated group dialog and support around topic of loss, with particular support around experiences of loss in their lives. Group Identified types of loss (relationships / self / things) and identified patterns, circumstances, and changes that precipitate losses. Reflected on thoughts / feelings around loss, normalized grief responses, and recognized variety in grief experience. Group noted Worden's four tasks of grief in discussion.   Group drew on Adlerian / Rogerian, narrative, MI,   Patient Progress: Alan Rangel participated in group and was engaged in the conversation.  He asked good questions, contributed with insight and was an attentive listener to his peers.  [address] bcc Pager, [phone] 4:46 PM

## 2020-11-30 NOTE — Progress Notes (Signed)
Alan Rangel slept very well.  Explained the situation regarding his upcoming test at 10 am.  He states that his right lower leg doesn't feel as tight today.  BP slightly elevated but is consistent with yesterday morning vital signs.  No shortness of breath, chest pain or leg pain voiced.  He did report having urinary incontinence and was encouraged to talk with MD this morning.

## 2020-11-30 NOTE — Progress Notes (Signed)
   11/30/20 2115  Psych Admission Type (Psych Patients Only)  Admission Status Voluntary  Psychosocial Assessment  Patient Complaints Anxiety  Eye Contact Fair  Facial Expression Anxious;Animated  Affect Appropriate to circumstance  Speech Logical/coherent  Interaction Assertive  Motor Activity Slow  Appearance/Hygiene Unremarkable  Behavior Characteristics Cooperative;Appropriate to situation  Mood Pleasant;Anxious  Thought Process  Coherency Concrete thinking  Content WDL  Delusions None reported or observed  Perception Hallucinations  Hallucination Auditory;Visual  Judgment Impaired  Confusion None  Danger to Self  Current suicidal ideation? Denies  Danger to Others  Danger to Others None reported or observed

## 2020-11-30 NOTE — Tx Team (Signed)
Interdisciplinary Treatment and Diagnostic Plan Update  11/30/2020 Time of Session: 9:35am Alan Rangel MRN: [id number]  Principal Diagnosis: Schizoaffective disorder [facility])  Secondary Diagnoses: Principal Problem:   Schizoaffective disorder (HCC) Active Problems:   HIV INFECTION   PTSD (post-traumatic stress disorder)   Current Medications:  Current Facility-Administered Medications  Medication Dose Route Frequency Provider Last Rate Last Admin   acetaminophen (TYLENOL) tablet 650 mg  650 mg Oral Q6H PRN Antonieta Pert, MD   650 mg at 11/28/20 2135   alum & mag hydroxide-simeth (MAALOX/MYLANTA) 200-200-20 MG/5ML suspension 30 mL  30 mL Oral Q4H PRN Antonieta Pert, MD   30 mL at 11/28/20 0302   amLODipine (NORVASC) tablet 10 mg  10 mg Oral Daily Antonieta Pert, MD   10 mg at 11/30/20 5732   atorvastatin (LIPITOR) tablet 10 mg  10 mg Oral Daily Antonieta Pert, MD   10 mg at 11/30/20 2025   benztropine (COGENTIN) tablet 0.5 mg  0.5 mg Oral BID Carlyn Reichert, MD   0.5 mg at 11/30/20 4270   clonazePAM (KLONOPIN) tablet 0.5 mg  0.5 mg Oral BID Antonieta Pert, MD   0.5 mg at 11/30/20 0931   dolutegravir (TIVICAY) tablet 50 mg  50 mg Oral Daily Antonieta Pert, MD   50 mg at 11/30/20 6237   emtricitabine-tenofovir AF (DESCOVY) 200-25 MG per tablet 1 tablet  1 tablet Oral Daily Antonieta Pert, MD   1 tablet at 11/30/20 0927   escitalopram (LEXAPRO) tablet 5 mg  5 mg Oral Daily Carlyn Reichert, MD   5 mg at 11/30/20 6283   gabapentin (NEURONTIN) capsule 100 mg  100 mg Oral q1800 Carlyn Reichert, MD   100 mg at 11/29/20 1734   hydrOXYzine (ATARAX/VISTARIL) tablet 25 mg  25 mg Oral TID PRN Antonieta Pert, MD       loperamide (IMODIUM) capsule 2 mg  2 mg Oral TID PRN Carlyn Reichert, MD   2 mg at 11/29/20 0530   magnesium hydroxide (MILK OF MAGNESIA) suspension 30 mL  30 mL Oral Daily PRN Antonieta Pert, MD       nicotine (NICODERM CQ - dosed in mg/24 hours) patch  14 mg  14 mg Transdermal Daily Antonieta Pert, MD   14 mg at 11/30/20 0950   oxyCODONE-acetaminophen (PERCOCET/ROXICET) 5-325 MG per tablet 1-2 tablet  1-2 tablet Oral Q6H PRN Antonieta Pert, MD       Semaglutide(0.25 or 0.5MG /DOS) Namon Cirri 0.5 mg  0.5 mg Injection Weekly Melbourne Abts W, PA-C   0.5 mg at 11/30/20 1517   traZODone (DESYREL) tablet 50 mg  50 mg Oral QHS PRN Antonieta Pert, MD   50 mg at 11/28/20 2135   ziprasidone (GEODON) capsule 60 mg  60 mg Oral BID WC Carlyn Reichert, MD   60 mg at 11/30/20 0930   PTA Medications: Medications Prior to Admission  Medication Sig Dispense Refill Last Dose   clonazePAM (KLONOPIN) 0.5 MG tablet Take 1 tablet (0.5 mg total) by mouth 2 (two) times daily as needed (Anxiety). 10 tablet 0    dolutegravir (TIVICAY) 50 MG tablet Take 1 tablet by mouth daily. 30 tablet 5    emtricitabine-tenofovir AF (DESCOVY) 200-25 MG tablet Take 1 tablet by mouth daily. 30 tablet 5    OZEMPIC, 0.25 OR 0.5 MG/DOSE, 2 MG/1.5ML SOPN Inject 0.5 mg as directed once a week.      sertraline (ZOLOFT) 100 MG tablet Take 100 mg  by mouth daily.      ziprasidone (GEODON) 40 MG capsule Take 40 mg by mouth daily.       Patient Stressors: Financial difficulties Health problems Medication change or noncompliance  Patient Strengths: Ability for insight Average or above average intelligence Capable of independent living Motivation for treatment/growth Supportive family/friends Work skills  Treatment Modalities: Medication Management, Group therapy, Case management,  1 to 1 session with clinician, Psychoeducation, Recreational therapy.   Physician Treatment Plan for Primary Diagnosis: Schizoaffective disorder (HCC) Long Term Goal(s): Improvement in symptoms so as ready for discharge   Short Term Goals: Ability to identify changes in lifestyle to reduce recurrence of condition will improve Ability to verbalize feelings will improve Ability to disclose and discuss  suicidal ideas Ability to demonstrate self-control will improve Ability to identify and develop effective coping behaviors will improve Ability to maintain clinical measurements within normal limits will improve  Medication Management: Evaluate patient's response, side effects, and tolerance of medication regimen.  Therapeutic Interventions: 1 to 1 sessions, Unit Group sessions and Medication administration.  Evaluation of Outcomes: Progressing  Physician Treatment Plan for Secondary Diagnosis: Principal Problem:   Schizoaffective disorder (HCC) Active Problems:   HIV INFECTION   PTSD (post-traumatic stress disorder)  Long Term Goal(s): Improvement in symptoms so as ready for discharge   Short Term Goals: Ability to identify changes in lifestyle to reduce recurrence of condition will improve Ability to verbalize feelings will improve Ability to disclose and discuss suicidal ideas Ability to demonstrate self-control will improve Ability to identify and develop effective coping behaviors will improve Ability to maintain clinical measurements within normal limits will improve     Medication Management: Evaluate patient's response, side effects, and tolerance of medication regimen.  Therapeutic Interventions: 1 to 1 sessions, Unit Group sessions and Medication administration.  Evaluation of Outcomes: Progressing   RN Treatment Plan for Primary Diagnosis: Schizoaffective disorder (HCC) Long Term Goal(s): Knowledge of disease and therapeutic regimen to maintain health will improve  Short Term Goals: Ability to remain free from injury will improve, Ability to verbalize frustration and anger appropriately will improve, Ability to demonstrate self-control, Ability to identify and develop effective coping behaviors will improve, and Compliance with prescribed medications will improve  Medication Management: RN will administer medications as ordered by provider, will assess and evaluate  patient's response and provide education to patient for prescribed medication. RN will report any adverse and/or side effects to prescribing provider.  Therapeutic Interventions: 1 on 1 counseling sessions, Psychoeducation, Medication administration, Evaluate responses to treatment, Monitor vital signs and CBGs as ordered, Perform/monitor CIWA, COWS, AIMS and Fall Risk screenings as ordered, Perform wound care treatments as ordered.  Evaluation of Outcomes: Progressing   LCSW Treatment Plan for Primary Diagnosis: Schizoaffective disorder (HCC) Long Term Goal(s): Safe transition to appropriate next level of care at discharge, Engage patient in therapeutic group addressing interpersonal concerns.  Short Term Goals: Engage patient in aftercare planning with referrals and resources, Increase social support, Increase ability to appropriately verbalize feelings, Increase emotional regulation, and Facilitate acceptance of mental health diagnosis and concerns  Therapeutic Interventions: Assess for all discharge needs, 1 to 1 time with Social worker, Explore available resources and support systems, Assess for adequacy in community support network, Educate family and significant other(s) on suicide prevention, Complete Psychosocial Assessment, Interpersonal group therapy.  Evaluation of Outcomes: Progressing   Progress in Treatment: Attending groups: Yes. Participating in groups: Yes. Taking medication as prescribed: Yes. Toleration medication: Yes. Family/Significant other contact made:  Yes, individual(s) contacted:  Case Manager  Patient understands diagnosis: Yes. Discussing patient identified problems/goals with staff: Yes. Medical problems stabilized or resolved: Yes. Denies suicidal/homicidal ideation: Yes. Issues/concerns per patient self-inventory: No.   New problem(s) identified: No, Describe:  none  New Short Term/Long Term Goal(s): medication stabilization, elimination of SI  thoughts, development of comprehensive mental wellness plan.    Patient Goals:  Did not attend  Discharge Plan or Barriers: Patient recently admitted. CSW will continue to follow and assess for appropriate referrals and possible discharge planning.     Reason for Continuation of Hospitalization: Depression Hallucinations Medication stabilization Suicidal ideation  Estimated Length of Stay: 3-5 days  Attendees: Patient: Did not attend 11/30/2020   Physician: Rhea Belton, DO 11/30/2020   Nursing:  11/30/2020   RN Care Manager: 11/30/2020   Social Worker: Melba Coon, LCSWA 11/30/2020   Recreational Therapist:  11/30/2020   Other:  11/30/2020   Other:  11/30/2020   Other: 11/30/2020     Scribe for Treatment Team: Aram Beecham, LCSWA 11/30/2020 10:40 AM
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
## 2020-11-30 NOTE — [facility] Counselor (Signed)
Per the patient's request the CSW provided the patient with a packet of information that contained housing and shelter listings, free/reduced food resources, IRC information, GoodRX cards, and suicide prevention information. 

## 2020-11-30 NOTE — Progress Notes (Signed)
Per nursing staff and chart review, patient was originally supposed to be transferred to [facility] ED in order to have a venous Doppler ultrasound study/DVT work-up of right lower extremity.  Per Fransico Michael, [facility], who spoke with [facility] ED Charge RN, clinical staff that conduct Doppler ultrasound studies at [facility] are not currently available and will not be back at [facility] until the morning of 11/30/2020.  Patient currently has appointment for Doppler ultrasound study of right lower extremity for 10:00 AM on 11/30/2020.  Per nursing staff, patient is sleeping comfortably, has palpable right lower extremity pulses, and denies any chest pain, shortness of breath, or any additional physical symptoms at this time.  Due to patient not being able to have his ultrasound study done until later this morning on 11/30/2020 and patient being currently asymptomatic at this time with notable pulses palpated by nursing, patient will remain at [facility] at this time.  Nursing staff to arrange for transfer to [facility] ED later this morning on 11/30/2020 for patient's Doppler ultrasound study to be completed at 10:00 AM on 11/30/2020.  Nursing to continue to monitor patient for safety.

## 2020-11-30 NOTE — Progress Notes (Signed)
PT Cancellation Note  Patient Details Name: Merrik Puebla MRN: [id number] DOB: Aug 16, 1975   Cancelled Treatment:    Reason Eval/Treat Not Completed: Other (comment), OT at [facility] will take over case. PT will sign off.   Rada Hay 11/30/2020, 10:58 AM  Blanchard Kelch PT Acute Rehabilitation Services Pager [phone] Office [phone]

## 2020-11-30 NOTE — Progress Notes (Addendum)
Per chart, Pt was to be sent to [facility]ED for evaluation.  Confirmed plan for him to be sent at 11pm.  Called [facility] transport to arrange transportation to [facility]ED for evaluation of possible DVT.  Informed [facility] charge nurse of his pending arrival.  ED-Charge nurse asked for him to be sent to [facility]ED.  [facility] notified and verify that he was to be transferred to [facility] as the notes do not reflect where he was to be sent (since report was already called earlier by Dr. Jerrel Ivory).  She will call ED charge nurse to discuss where to send him.

## 2020-11-30 NOTE — Progress Notes (Addendum)
Alan Rangel has been in his room much of the evening but did come out and sit in the dayroom for 30 minutes.  He had slow steady gait with his walker and no evidence of limping.   He denied any pain or discomfort and appeared to be in no physical distress.  Assessed his right lower extremity and noted non pitting edema, no redness and skin was warm and dry.  He had palpable pedal pulses.  He denied any shortness of breath or chest pain.  He denied SI/HI or AVH.  He declined taking his trazodone since he was planning on going to the ED for evaluation but did fall asleep without difficulty.  It was decided that he will keep his appointment for 10am for ultrasound study and he was not informed of the change in plans due to him being asleep.  He is resting with his eyes closed and appears to be asleep.  Q 15 minute checks maintained for safety.     11/29/20 2130  Psych Admission Type (Psych Patients Only)  Admission Status Voluntary  Psychosocial Assessment  Patient Complaints Anxiety;Depression  Eye Contact Fair  Facial Expression Sad  Affect Appropriate to circumstance  Speech Logical/coherent  Interaction Assertive  Motor Activity Slow  Appearance/Hygiene Unremarkable  Behavior Characteristics Cooperative;Appropriate to situation  Mood Depressed;Pleasant  Thought Process  Coherency Concrete thinking  Content WDL  Delusions None reported or observed  Perception WDL  Hallucination None reported or observed  Judgment Impaired  Confusion None  Danger to Self  Current suicidal ideation? Denies  Danger to Others  Danger to Others None reported or observed

## 2020-11-30 NOTE — Progress Notes (Signed)
Bilateral lower extremity venous duplex has been completed. Preliminary results can be found in CV Proc through chart review.  Results were faxed to Dr. Jola Babinski.  11/30/20 10:20 AM Olen Cordial RVT

## 2020-11-30 NOTE — [facility] Group Notes (Signed)
Adult Psychoeducational Group Note  Date:  11/30/2020 Time:  5:31 PM  Group Topic/Focus:  Goals Group:   The focus of this group is to help patients establish daily goals to achieve during treatment and discuss how the patient can incorporate goal setting into their daily lives to aide in recovery.  Participation Level:  Active  Participation Quality:  Appropriate  Affect:  Appropriate  Cognitive:  Appropriate  Insight: Appropriate  Engagement in Group:  Engaged  Modes of Intervention:  Discussion  Additional Comments:  Patient attended group and said that his goal for today is to go for his uter sound at [employer].   Alan Rangel 11/30/2020, 5:31 PM

## 2020-11-30 NOTE — Progress Notes (Signed)
OT Cancellation Note  Patient Details Name: Alan Rangel MRN: [id number] DOB: 1975/12/11   Cancelled Treatment:    Reason Eval/Treat Not Completed: OT screened, no needs identified, will sign off  [name] is a 45 y/o male with PMHx of MDD with psychotic features, schizoaffectivedisorder (bipolar vs depressed type) presenting voluntarily for command AH and SI with a plan. Pt admitted to [facility] for inpatient admission; during his stay pt developed swelling of RLE; ultrasound completed and results show no indication of DVT. OT order placed to address RLE swelling and functional ambulation with use of RW.   Prior to admission, pt was independent with use of straight cane at home. Pt currently able to ambulate independently with use of RW; RW indicated at this time d/t inpatient admission at [facility] (canes not allowed on unit d/t safety risk and use of cane as a weapon). Pt reports RLE continues to appear swollen, however ultrasound shows no indication of DVT. Additional notes in chart indicate swelling possible side effect to medication regimen. OT provided pt with education and compensatory strategies to reduce swelling of RLE including elevating RLE when seated or lying in bed and use of compression socks. Pt receptive to education, OT will sign off at this time.   Sharonann Malbrough 11/30/2020, 3:30 PM

## 2020-12-01 DIAGNOSIS — I10 Essential (primary) hypertension: Secondary | ICD-10-CM

## 2020-12-01 DIAGNOSIS — R609 Edema, unspecified: Secondary | ICD-10-CM

## 2020-12-01 LAB — GLUCOSE, CAPILLARY: Glucose-Capillary: 98 mg/dL (ref 70–99)

## 2020-12-01 LAB — BRAIN NATRIURETIC PEPTIDE: B Natriuretic Peptide: 16 pg/mL (ref 0.0–100.0)

## 2020-12-01 MED ORDER — HYDROCHLOROTHIAZIDE 12.5 MG PO CAPS
12.5000 mg | ORAL_CAPSULE | Freq: Every day | ORAL | Status: DC
  Administered 2020-12-01 – 2020-12-06 (×6): 12.5 mg via ORAL
  Filled 2020-12-01 (×8): qty 1

## 2020-12-01 MED ORDER — ESCITALOPRAM OXALATE 10 MG PO TABS
10.0000 mg | ORAL_TABLET | Freq: Every day | ORAL | Status: DC
  Administered 2020-12-02 – 2020-12-06 (×5): 10 mg via ORAL
  Filled 2020-12-01 (×6): qty 1

## 2020-12-01 NOTE — Progress Notes (Addendum)
[facility] MD Progress Note  12/01/2020 5:09 PM Raffaele Derise  MRN:  [id number] Subjective:   Alan Rangel is a 57 YOM with a PMHx of HIV and DM and a PPHx of MDD with psychotic features, schizoaffective disorder (bipolar vs depressed type) presenting voluntarily for command AH and SI with a plan.   24 hr events: no documented behavioral issues, no PRN medications given for agitation. Patient was not transferred to ED for DVT study given lack of available techs overnight.   On interview this morning, patient appears improved compared to previous days. He denies seeing the violent blue figure for the past two days and says that he has not had any disturbing command AH in the last 24 hrs. His thought process is organized and coherent. He still reports that "Ashtay" is keeping him company but he seems to understand that other people cannot see said person. He denies any episodes of incontinence and says that his RLE pain and swelling is improved. He believes the gabapentin is helping his RLS. He reports SI and says that he does have a plan in mind but that he would not act on it. He contracts for safety.   Principal Problem: Schizoaffective disorder (HCC) Diagnosis: Principal Problem:   Schizoaffective disorder (HCC) Active Problems:   HIV INFECTION   PTSD (post-traumatic stress disorder)  Total Time spent in direct patient care: 1 hour  Past Psychiatric History: as above  Past Medical History:  Past Medical History:  Diagnosis Date   Depression    Episodic mood disorder (HCC)    Generalized anxiety disorder    HIV (human immunodeficiency virus infection) (HCC)    HTN (hypertension)    Panic attack    Paranoid schizophrenia (HCC)    Personality disorder (HCC)    Schizoaffective disorder, bipolar type (HCC)     Past Surgical History:  Procedure Laterality Date   SKIN GRAFT Right    foot/leg   Family History:  Family History  Problem Relation Age of Onset   Sinusitis Mother    Mental  illness Father    Hypertension Maternal Grandmother    Diabetes Maternal Grandmother    Family Psychiatric  History: negative Social History:  Social History   Substance and Sexual Activity  Alcohol Use No   Alcohol/week: 0.0 standard drinks   Comment: History of, but not currently     Social History   Substance and Sexual Activity  Drug Use No    Social History   Socioeconomic History   Marital status: Single    Spouse name: Not on file   Number of children: 0   Years of education: college   Highest education level: Not on file  Occupational History   Occupation: UNEMPLOYED    Employer: UNEMPLOYED   Occupation: Consulting civil engineer  Tobacco Use   Smoking status: Every Day    Packs/day: 0.50    Years: 10.00    Pack years: 5.00    Types: Cigarettes   Smokeless tobacco: Never   Tobacco comments:    would like patches  Vaping Use   Vaping Use: Never used  Substance and Sexual Activity   Alcohol use: No    Alcohol/week: 0.0 standard drinks    Comment: History of, but not currently   Drug use: No   Sexual activity: Never    Partners: Female, Male    Birth control/protection: Condom    Comment: pt given condoms 08/2020  Other Topics Concern   Not on file  Social History  Narrative   Not on file   Social Determinants of Health   Financial Resource Strain: Not on file  Food Insecurity: Not on file  Transportation Needs: Not on file  Physical Activity: Not on file  Stress: Not on file  Social Connections: Not on file   Additional Social History:     Sleep: Fair  Appetite:  Fair  Current Medications: Current Facility-Administered Medications  Medication Dose Route Frequency Provider Last Rate Last Admin   acetaminophen (TYLENOL) tablet 650 mg  650 mg Oral Q6H PRN Antonieta Pert, MD   650 mg at 11/28/20 2135   alum & mag hydroxide-simeth (MAALOX/MYLANTA) 200-200-20 MG/5ML suspension 30 mL  30 mL Oral Q4H PRN Antonieta Pert, MD   30 mL at 11/28/20 0302    atorvastatin (LIPITOR) tablet 10 mg  10 mg Oral Daily Antonieta Pert, MD   10 mg at 12/01/20 0831   benztropine (COGENTIN) tablet 0.5 mg  0.5 mg Oral BID Carlyn Reichert, MD   0.5 mg at 12/01/20 0831   clonazePAM (KLONOPIN) tablet 0.5 mg  0.5 mg Oral BID Antonieta Pert, MD   0.5 mg at 12/01/20 7846   dolutegravir (TIVICAY) tablet 50 mg  50 mg Oral Daily Antonieta Pert, MD   50 mg at 12/01/20 0831   emtricitabine-tenofovir AF (DESCOVY) 200-25 MG per tablet 1 tablet  1 tablet Oral Daily Antonieta Pert, MD   1 tablet at 12/01/20 0832   escitalopram (LEXAPRO) tablet 5 mg  5 mg Oral Daily Carlyn Reichert, MD   5 mg at 12/01/20 0831   gabapentin (NEURONTIN) capsule 100 mg  100 mg Oral q1800 Carlyn Reichert, MD   100 mg at 11/30/20 1817   hydrALAZINE (APRESOLINE) tablet 25 mg  25 mg Oral Q8H PRN Ronaldo Miyamoto, Tyrone A, DO       hydrochlorothiazide (MICROZIDE) capsule 12.5 mg  12.5 mg Oral Daily Candelaria Stagers T, MD   12.5 mg at 12/01/20 9629   hydrOXYzine (ATARAX/VISTARIL) tablet 25 mg  25 mg Oral TID PRN Antonieta Pert, MD   25 mg at 11/30/20 2115   loperamide (IMODIUM) capsule 2 mg  2 mg Oral TID PRN Carlyn Reichert, MD   2 mg at 11/29/20 0530   magnesium hydroxide (MILK OF MAGNESIA) suspension 30 mL  30 mL Oral Daily PRN Antonieta Pert, MD       nicotine (NICODERM CQ - dosed in mg/24 hours) patch 14 mg  14 mg Transdermal Daily Antonieta Pert, MD   14 mg at 12/01/20 0834   oxyCODONE-acetaminophen (PERCOCET/ROXICET) 5-325 MG per tablet 1 tablet  1 tablet Oral Q8H PRN Comer Locket, MD       [START ON 12/07/2020] Semaglutide(0.25 or 0.5MG /DOS) Namon Cirri 0.5 mg  0.5 mg Injection Weekly Carlyn Reichert, MD       traZODone (DESYREL) tablet 25 mg  25 mg Oral QHS PRN Mason Jim, Elika Godar E, MD       ziprasidone (GEODON) capsule 60 mg  60 mg Oral BID WC Carlyn Reichert, MD   60 mg at 12/01/20 5284    Lab Results:  Results for orders placed or performed during the hospital encounter of 11/24/20 (from  the past 48 hour(s))  Glucose, capillary     Status: None   Collection Time: 11/30/20  5:31 AM  Result Value Ref Range   Glucose-Capillary 84 70 - 99 mg/dL    Comment: Glucose reference range applies only to samples taken after fasting for  at least 8 hours.   Comment 1 Notify RN   Renal function panel     Status: Abnormal   Collection Time: 11/30/20  6:28 PM  Result Value Ref Range   Sodium 140 135 - 145 mmol/L   Potassium 3.8 3.5 - 5.1 mmol/L   Chloride 105 98 - 111 mmol/L   CO2 26 22 - 32 mmol/L   Glucose, Bld 129 (H) 70 - 99 mg/dL    Comment: Glucose reference range applies only to samples taken after fasting for at least 8 hours.   BUN 12 6 - 20 mg/dL   Creatinine, Ser 1.611.22 0.61 - 1.24 mg/dL   Calcium 8.9 8.9 - 09.610.3 mg/dL   Phosphorus 3.8 2.5 - 4.6 mg/dL   Albumin 4.3 3.5 - 5.0 g/dL   GFR, Estimated >04>60 >54>60 mL/min    Comment: (NOTE) Calculated using the CKD-EPI Creatinine Equation (2021)    Anion gap 9 5 - 15    Comment: Performed at [facility], [address] [place], [place] [address]  Glucose, capillary     Status: None   Collection Time: 12/01/20  5:56 AM  Result Value Ref Range   Glucose-Capillary 98 70 - 99 mg/dL    Comment: Glucose reference range applies only to samples taken after fasting for at least 8 hours.  Brain natriuretic peptide     Status: None   Collection Time: 12/01/20  6:27 AM  Result Value Ref Range   B Natriuretic Peptide 16.0 0.0 - 100.0 pg/mL    Comment: Performed at [facility], [address] [place], [place] [address]    Blood Alcohol level:  Lab Results  Component Value Date   ETH <10 11/23/2020   ETH <10 05/08/2017    Metabolic Disorder Labs: Lab Results  Component Value Date   HGBA1C 5.9 (H) 11/25/2020   MPG 122.63 11/25/2020   MPG 117 (H) 06/17/2013   No results found for: PROLACTIN Lab Results  Component Value Date   CHOL 153 11/25/2020   TRIG 99 11/25/2020   HDL 30 (L)  11/25/2020   CHOLHDL 5.1 11/25/2020   VLDL 20 11/25/2020   LDLCALC 103 (H) 11/25/2020   LDLCALC 97 04/13/2020    Physical Findings: AIMS: 0 for jaw movements (improved from previous days) CIWA:    COWS:     Musculoskeletal: Strength & Muscle Tone: within normal limits Gait & Station: normal Patient leans: N/A  Psychiatric Specialty Exam:  Presentation  General Appearance: Casual, obese, adequate hygiene  Eye Contact:Good  Speech:Clear and Coherent  Speech Volume:Normal  Handedness:Right   Mood and Affect  Mood:described as improving - appears calm and more euthymic  Affect:Congruent   Thought Process  Thought Processes:Coherent and goal directed, more linear  Descriptions of Associations:Intact  Orientation:Full (Time, Place and Person)  Thought Content:Endorses VH of a person and AH yesterday; reports improving paranoia; is not grossly responding to internal/external stimuli on exam; denies ideas of reference or first rank symptoms   History of Schizophrenia/Schizoaffective disorder:Yes  Duration of Psychotic Symptoms:Greater than six months  Hallucinations: AH yesterday and VH of visitor in his room  Ideas of Reference:None  Suicidal Thoughts: SI with a plan, contracts for safety  Homicidal Thoughts:denies   Sensorium  Memory:Immediate Good; Recent Good; Remote Good  Judgment:Fair  Insight:Fair   Executive Functions  Concentration:Fair  Attention Span:Fair  Recall:Fair  Fund of Knowledge:Fair  Language:Fair   Psychomotor Activity  Psychomotor Activity: nml   Assets  Assets:Housing   Sleep  Sleep:Sleep: Fair Number of Hours of Sleep: 6.25   Physical Exam Constitutional:      Appearance: He is obese.  HENT:     Head: Normocephalic and atraumatic.  Eyes:     Extraocular Movements: Extraocular movements intact.  Cardiovascular:     Rate and Rhythm: Normal rate and regular rhythm.  Pulmonary:     Effort: Pulmonary  effort is normal.  Musculoskeletal:        General: Normal range of motion.  Neurological:     General: No focal deficit present.     Mental Status: He is alert.    Review of Systems  Respiratory:  Negative for shortness of breath.   Cardiovascular:  Negative for chest pain.  Neurological:  Negative for headaches.  Blood pressure (!) 124/97, pulse 91, temperature 98.3 F (36.8 C), temperature source Oral, resp. rate 20, height 6\' 1"  (1.854 m), weight (!) 149.7 kg, SpO2 95 %. Body mass index is 43.54 kg/m.   Safety and Monitoring: -- VOLUNTARY admission to inpatient psychiatric unit for safety, stabilization and treatment -- Daily contact with patient to assess and evaluate symptoms and progress in treatment -- Patient's case to be discussed in multi-disciplinary team meeting -- Observation Level : q15 minute checks -- Vital signs:  q12 hours -- Precautions: suicide  Psychosis, PPHx of Schizoaffective d/o and MDD w psychotic features AP labs: A1C 5.9, lipids with LDL 103 HDL 30 (on atorvastatin), EKG w sinus brady, Qtc of 400 Reports taking his Geodon once per day, not with food -Patient received Geodon 40 mg daily 7/14 -Continue Geodon 60 mg bid (started 7/19) given worsening psychosis  EKG (7/17): NSR, Qtc 431 ; Repeat: NSR with QTC 427 -Continue cogentin 0.5 mg bid for jaw movements (Pt subjectively and objectively improved) -Propranolol 10 mg (7/18) for akathisia, monitor HR - D/C given somatic complaints and Hx of fall  Anxiety and depression -Klonopin 0.5 mg bid for anxiety and panic symptoms (patient takes PRN as OP) -Paxil 5 mg (started 7/14), increased to 10 mg 7/16 given good tolerability - D/C'd 7/18 due to GI side effects  -Imodium tid PRN for diarrhea -Increase Lexapro to 10 mg (7/21) for MDD  Restless Leg Syndrome  Patient complaining of frequent urge to move his legs at night, improved -Continue gabapentin 100 mg PM  Tobacco use disorder -Nicoderm patch 14  mg  Continue PRN's: Tylenol, Maalox, Atarax, Milk of Magnesia, Trazodone (decrease to 25 mg given incontinence)   Medical management  HIV: continue home meds: Tivicay 50 mg PO daily, Descovy 200-25 mg PO daily -Last VL undetectable, last CD4 1,700 (08/2020) -Recheck on 7/16: VL (<20) and CD4 (1724) -Monitor for signs of infection  R leg swelling DVT study negative, BNP wnl, symptoms improved Amlodipine D/C'd and replaced with 12.5 mg HCTZ, monitor K with regular BMPs  DM: continue semaglutide injection weekly (last dose 7/20), well controlled with A1C of 5.9 (technically does not meet criteria for DM) -Monitor with daily AM POC glucose  Admission labs:  BMP w Cr of 1.32 on admission, 1.24 (7/17)  CBC wnl  UDS with benzos (prescribed) and THC    Treatment Plan Summary: Daily contact with patient to assess and evaluate symptoms and progress in treatment and Medication management   06-19-1981 PGY-1, Psychiatry

## 2020-12-01 NOTE — Progress Notes (Signed)
   12/01/20 2000  Psych Admission Type (Psych Patients Only)  Admission Status Voluntary  Psychosocial Assessment  Patient Complaints None  Eye Contact Fair  Facial Expression Anxious;Animated  Affect Appropriate to circumstance  Speech Logical/coherent  Interaction Assertive  Motor Activity Slow  Appearance/Hygiene Unremarkable  Behavior Characteristics Cooperative;Calm  Mood Pleasant  Thought Process  Coherency Concrete thinking  Content WDL  Delusions None reported or observed  Perception Hallucinations  Hallucination Auditory;Visual  Judgment Impaired  Confusion None  Danger to Self  Current suicidal ideation? Denies  Danger to Others  Danger to Others None reported or observed

## 2020-12-01 NOTE — Progress Notes (Signed)
PT is alert and oriented to person, place, time and situation. Pt is calm, cooperative, pleasant, soft spoken. Pt denies suicidal and homicidal ideation, reports hearing voices, denies command type AH, denies other types of hallucinations at this time. PT spends time talking on the telephone, is social with staff and peers, is medication compliant. PT has bilateral swelling on lower legs/ankles, pt encouraged to elevate his legs, swelling has improved. Pt denies pain. Pt attend groups. Will continue to monitor pt per Q15 minute face checks and monitor for safety and progress.

## 2020-12-01 NOTE — Progress Notes (Signed)
PROGRESS NOTE  Alan Rangel UYQ:[id number] DOB: January 10, 1976   PCP: [facility]  DOA: 11/24/2020 LOS: 7  Chief complaints:  Chief Complaint  Patient presents with   MDD     Brief Narrative / Interim history: 45 year old M with PMH of HIV and schizoaffective disorder admitted to [facility] due to suicidal ideation.  Hospitalist service called for BLE edema and RLE pain that started 2 days prior after playing basketball.  No cardiopulmonary symptoms.  BLE Doppler negative for DVT.  Not anemic.  Albumin, BNP, renal function and TSH within normal.  BP slightly up.  Started on low-dose HCTZ, compression stocking, salt restriction and leg elevation   Objective: Vitals:   11/30/20 0537 11/30/20 1634 12/01/20 0547 12/01/20 0549  BP: (!) 136/92 122/73 131/89 (!) 124/97  Pulse: 86 71 80 91  Resp:  20    Temp:   98.3 F (36.8 C)   TempSrc:   Oral   SpO2: 91% 98% 98% 95%  Weight:      Height:       No intake or output data in the 24 hours ending 12/01/20 1153 Filed Weights   11/24/20 0945  Weight: (!) 149.7 kg    Examination: Patient is at [facility].   Assessment & Plan: BLE edema/RLE pain-unclear etiology but could be due to amlodipine or venous insufficiency.  Work-up including renal function, BNP, TSH, albumin and CBC without significant finding. -Started low-dose HCTZ.  Avoid vasodilators such as amlodipine -Okay to use hydralazine as needed as long as he is not getting this daily -Agree with compression stockings, sodium and leg elevation  HIV: Seems to be fairly controlled.  Recent CD4 count 1724 -Continue Descovy and Tivicay  Prediabetes: A1c 5.9%. -Continue Ozempic  Suicidal ideation/bipolar disorder/schizoaffective disorder -Per primary  Disposition -Per primary.  Body mass index is 43.54 kg/m.         DVT prophylaxis:  Place TED hose Start: 11/30/20 1805     Sch Meds:  Scheduled Meds:  atorvastatin  10 mg Oral Daily   benztropine  0.5 mg Oral BID    clonazePAM  0.5 mg Oral BID   dolutegravir  50 mg Oral Daily   emtricitabine-tenofovir AF  1 tablet Oral Daily   escitalopram  5 mg Oral Daily   gabapentin  100 mg Oral q1800   hydrochlorothiazide  12.5 mg Oral Daily   nicotine  14 mg Transdermal Daily   [START ON 12/07/2020] Semaglutide(0.25 or 0.5MG /DOS)  0.5 mg Injection Weekly   ziprasidone  60 mg Oral BID WC   Continuous Infusions: PRN Meds:.acetaminophen, alum & mag hydroxide-simeth, hydrALAZINE, hydrOXYzine, loperamide, magnesium hydroxide, oxyCODONE-acetaminophen, traZODone  Antimicrobials: Anti-infectives (From admission, onward)    Start     Dose/Rate Route Frequency Ordered Stop   11/24/20 1800  dolutegravir (TIVICAY) tablet 50 mg        50 mg Oral Daily 11/24/20 1118     11/24/20 1800  emtricitabine-tenofovir AF (DESCOVY) 200-25 MG per tablet 1 tablet        1 tablet Oral Daily 11/24/20 1118          I have personally reviewed the following labs and images: CBC: No results for input(s): WBC, NEUTROABS, HGB, HCT, MCV, PLT in the last 168 hours. BMP &GFR Recent Labs  Lab 11/26/20 0634 11/30/20 1828  NA 141 140  K 4.0 3.8  CL 108 105  CO2 26 26  GLUCOSE 113* 129*  BUN 11 12  CREATININE 1.24 1.22  CALCIUM 8.8*  8.9  PHOS  --  3.8   Estimated Creatinine Clearance: 120.3 mL/min (by C-G formula based on SCr of 1.22 mg/dL). Liver & Pancreas: Recent Labs  Lab 11/30/20 1828  ALBUMIN 4.3   No results for input(s): LIPASE, AMYLASE in the last 168 hours. No results for input(s): AMMONIA in the last 168 hours. Diabetic: No results for input(s): HGBA1C in the last 72 hours. Recent Labs  Lab 11/27/20 1739 11/28/20 0535 11/29/20 0546 11/30/20 0531 12/01/20 0556  GLUCAP 116* 95 93 84 98   Cardiac Enzymes: No results for input(s): CKTOTAL, CKMB, CKMBINDEX, TROPONINI in the last 168 hours. No results for input(s): PROBNP in the last 8760 hours. Coagulation Profile: No results for input(s): INR, PROTIME in the  last 168 hours. Thyroid Function Tests: No results for input(s): TSH, T4TOTAL, FREET4, T3FREE, THYROIDAB in the last 72 hours. Lipid Profile: No results for input(s): CHOL, HDL, LDLCALC, TRIG, CHOLHDL, LDLDIRECT in the last 72 hours. Anemia Panel: No results for input(s): VITAMINB12, FOLATE, FERRITIN, TIBC, IRON, RETICCTPCT in the last 72 hours. Urine analysis:    Component Value Date/Time   COLORURINE DARK YELLOW 07/28/2015 1123   APPEARANCEUR CLEAR 07/28/2015 1123   LABSPEC 1.037 (H) 07/28/2015 1123   PHURINE 6.0 07/28/2015 1123   GLUCOSEU NEGATIVE 07/28/2015 1123   GLUCOSEU NEG mg/dL 01/75/1025 8527   HGBUR NEGATIVE 07/28/2015 1123   BILIRUBINUR NEGATIVE 07/28/2015 1123   BILIRUBINUR negative 08/25/2014 1440   KETONESUR NEGATIVE 07/28/2015 1123   PROTEINUR TRACE (A) 07/28/2015 1123   UROBILINOGEN 1.0 08/25/2014 1440   UROBILINOGEN 1.0 09/13/2010 1142   NITRITE NEGATIVE 07/28/2015 1123   LEUKOCYTESUR NEGATIVE 07/28/2015 1123   Sepsis Labs: Invalid input(s): PROCALCITONIN, LACTICIDVEN  Microbiology: Recent Results (from the past 240 hour(s))  Resp Panel by RT-PCR (Flu A&B, Covid) Nasopharyngeal Swab     Status: None   Collection Time: 11/23/20 10:44 AM   Specimen: Nasopharyngeal Swab; Nasopharyngeal(NP) swabs in vial transport medium  Result Value Ref Range Status   SARS Coronavirus 2 by RT PCR NEGATIVE NEGATIVE Final    Comment: (NOTE) SARS-CoV-2 target nucleic acids are NOT DETECTED.  The SARS-CoV-2 RNA is generally detectable in upper respiratory specimens during the acute phase of infection. The lowest concentration of SARS-CoV-2 viral copies this assay can detect is 138 copies/mL. A negative result does not preclude SARS-Cov-2 infection and should not be used as the sole basis for treatment or other patient management decisions. A negative result may occur with  improper specimen collection/handling, submission of specimen other than nasopharyngeal swab, presence  of viral mutation(s) within the areas targeted by this assay, and inadequate number of viral copies(<138 copies/mL). A negative result must be combined with clinical observations, patient history, and epidemiological information. The expected result is Negative.  Fact Sheet for Patients:  BloggerCourse.com  Fact Sheet for Healthcare Providers:  SeriousBroker.it  This test is no t yet approved or cleared by the [place] FDA and  has been authorized for detection and/or diagnosis of SARS-CoV-2 by FDA under an Emergency Use Authorization (EUA). This EUA will remain  in effect (meaning this test can be used) for the duration of the COVID-19 declaration under Section 564(b)(1) of the Act, 21 U.S.C.section 360bbb-3(b)(1), unless the authorization is terminated  or revoked sooner.       Influenza A by PCR NEGATIVE NEGATIVE Final   Influenza B by PCR NEGATIVE NEGATIVE Final    Comment: (NOTE) The Xpert Xpress SARS-CoV-2/FLU/RSV plus assay is intended as an aid in  the diagnosis of influenza from Nasopharyngeal swab specimens and should not be used as a sole basis for treatment. Nasal washings and aspirates are unacceptable for Xpert Xpress SARS-CoV-2/FLU/RSV testing.  Fact Sheet for Patients: BloggerCourse.com  Fact Sheet for Healthcare Providers: SeriousBroker.it  This test is not yet approved or cleared by the [place] FDA and has been authorized for detection and/or diagnosis of SARS-CoV-2 by FDA under an Emergency Use Authorization (EUA). This EUA will remain in effect (meaning this test can be used) for the duration of the COVID-19 declaration under Section 564(b)(1) of the Act, 21 U.S.C. section 360bbb-3(b)(1), unless the authorization is terminated or revoked.  Performed at [facility], [address], [place], [place] [address]     Radiology Studies: No  results found.    Izamar Linden T. Ellese Julius [facility]  If 7PM-7AM, please contact night-coverage [email or web address] 12/01/2020, 11:53 AM

## 2020-12-01 NOTE — [facility] Group Notes (Signed)
[facility] Group Notes:  (Nursing/MHT/Case Management/Adjunct)  Date:  12/01/2020  Time:  10:28 AM  Type of Therapy:  Group Therapy  Participation Level:  Active  Participation Quality:  Appropriate  Affect:  Appropriate  Cognitive:  Alert  Insight:  Appropriate  Engagement in Group:  Engaged  Modes of Intervention:  Discussion  Summary of Progress/Problems: Pt stated he feels ok. He states he thought he was ready for discharge but MD not releasing today so his goal is to work on getting ready for [employer].  Malva Limes 12/01/2020, 10:28 AM

## 2020-12-01 NOTE — [facility] Group Notes (Signed)
[facility] Group Notes:  (Nursing/MHT/Case Management/Adjunct)  Date:  12/01/2020  Time:  8:56 PM  Type of Therapy:  Group Therapy  Participation Level:  Active  Participation Quality:  Attentive  Affect:  Appropriate  Cognitive:  Appropriate  Insight:  Improving  Engagement in Group:  Developing/Improving  Modes of Intervention:  Discussion  Summary of Progress/Problems:  Alan Rangel 12/01/2020, 8:56 PM

## 2020-12-02 LAB — RENAL FUNCTION PANEL
Albumin: 4.1 g/dL (ref 3.5–5.0)
Anion gap: 6 (ref 5–15)
BUN: 12 mg/dL (ref 6–20)
CO2: 28 mmol/L (ref 22–32)
Calcium: 8.8 mg/dL — ABNORMAL LOW (ref 8.9–10.3)
Chloride: 105 mmol/L (ref 98–111)
Creatinine, Ser: 1.27 mg/dL — ABNORMAL HIGH (ref 0.61–1.24)
GFR, Estimated: >60 mL/min (ref >60)
Glucose, Bld: 87 mg/dL (ref 70–99)
Phosphorus: 3.6 mg/dL (ref 2.5–4.6)
Potassium: 4.1 mmol/L (ref 3.5–5.1)
Sodium: 139 mmol/L (ref 135–145)

## 2020-12-02 LAB — CBC
HCT: 42.6 % (ref 39.0–52.0)
Hemoglobin: 14.4 g/dL (ref 13.0–17.0)
MCH: 32.1 pg (ref 26.0–34.0)
MCHC: 33.8 g/dL (ref 30.0–36.0)
MCV: 95.1 fL (ref 80.0–100.0)
Platelets: 227 10*3/uL (ref 150–400)
RBC: 4.48 MIL/uL (ref 4.22–5.81)
RDW: 13.2 % (ref 11.5–15.5)
WBC: 7.4 10*3/uL (ref 4.0–10.5)
nRBC: 0 % (ref 0.0–0.2)

## 2020-12-02 LAB — CK: Total CK: 280 U/L (ref 49–397)

## 2020-12-02 LAB — GLUCOSE, CAPILLARY
Glucose-Capillary: 94 mg/dL (ref 70–99)
Glucose-Capillary: 135 mg/dL — ABNORMAL HIGH (ref 70–99)

## 2020-12-02 LAB — MAGNESIUM: Magnesium: 1.8 mg/dL (ref 1.7–2.4)

## 2020-12-02 MED ORDER — GABAPENTIN 100 MG PO CAPS
100.0000 mg | ORAL_CAPSULE | Freq: Three times a day (TID) | ORAL | Status: DC
  Administered 2020-12-02 – 2020-12-03 (×4): 100 mg via ORAL
  Filled 2020-12-02 (×8): qty 1

## 2020-12-02 MED ORDER — HYDROXYZINE HCL 25 MG PO TABS: 25.0000 mg | ORAL_TABLET | Freq: Two times a day (BID) | ORAL | Status: DC | PRN

## 2020-12-02 MED ORDER — ZIPRASIDONE HCL 80 MG PO CAPS
80.0000 mg | ORAL_CAPSULE | Freq: Two times a day (BID) | ORAL | Status: DC
  Administered 2020-12-02 – 2020-12-06 (×9): 80 mg via ORAL
  Filled 2020-12-02 (×13): qty 1

## 2020-12-02 MED ORDER — HYDROXYZINE HCL 25 MG PO TABS
25.0000 mg | ORAL_TABLET | Freq: Every day | ORAL | Status: DC
  Administered 2020-12-02 – 2020-12-05 (×4): 25 mg via ORAL
  Filled 2020-12-02 (×6): qty 1

## 2020-12-02 MED ORDER — TRAZODONE HCL 50 MG PO TABS: 50.0000 mg | ORAL_TABLET | Freq: Every evening | ORAL | Status: DC | PRN

## 2020-12-02 MED ORDER — TRAZODONE HCL 100 MG PO TABS: 100.0000 mg | ORAL_TABLET | Freq: Every evening | ORAL | Status: DC | PRN

## 2020-12-02 NOTE — Progress Notes (Signed)
Contacted OT/PT at [phone], Dahlia Client and requested an OT/PT tech to come over to do the measurements for compression stockings ordered for pt for swelling in his bilateral lower legs. Dahlia Client advised that she needs to look at who is available and when they can come over to measure and order them for pt, and will call back with that infomation. [facility] 300 /400 hall nurses station call back number was provided and [facility] adult unit secretary informed that this call is expected.

## 2020-12-02 NOTE — Progress Notes (Signed)
Recreation Therapy Notes  Date:  7.22.22 Time: 0930 Location: 300 Hall Dayroom  Group Topic: Stress Management  Goal Area(s) Addresses:  Patient will identify positive stress management techniques. Patient will identify benefits of using stress management post d/c.  Behavioral Response: Attentive  Intervention: Stress Management  Activity :  Meditation.  LRT played a meditation that focused on setting personal boundaries even if that makes others upset or uncomfortable.  Education:  Stress Management, Discharge Planning.   Education Outcome: Acknowledges Education  Clinical Observations/Feedback: Pt attended and participated in activity.  Pt expressed no concerns.     Alan Rangel, LRT/CTRS         Alan Rangel A 12/02/2020 11:50 AM

## 2020-12-02 NOTE — Progress Notes (Signed)
Pt has been alert and oriented to person, place, time and situation. Pt is calm, cooperative, pleasant, soft spoken, denies suicidal and homicidal ideation, reports chronic hallucinations, auditory and visual types, denies commands being told to him by [facility] at this time, reports feelings of depression and anxiety. Pt reports the Neurontin is helping him a great deal with pain, and has been instructed to rest due to chronic pain and swelling in bilateral lower legs. Pt is medication compliant. Pt is out for meals, spends time in the dayroom watching tv, or in is room resting quietly. Appetite is good. Will continue to monitor pt per Q15 minute face checks and monitor for safety and progress.

## 2020-12-02 NOTE — Progress Notes (Signed)
Reviewed patient's chart.  Vital signs within normal.  Renal function without significant finding.  CBC and CK within normal.  Continue low-dose HCTZ, compression stocking, leg elevation and low-sodium diet.  Recheck renal function in 3 days, then in 1 week.

## 2020-12-02 NOTE — Progress Notes (Addendum)
[facility] MD Progress Note  12/02/2020 4:06 PM Alan Rangel  MRN:  [id number] Subjective:   Alan Rangel is a [age] YOM with a PMHx of HIV and DM and a PPHx of MDD with psychotic features, schizoaffective disorder (bipolar vs depressed type) presenting voluntarily for command AH and SI with a plan.   24 hr events: no documented behavioral issues, no PRN medications given for agitation.   On interview this morning patient appears more scattered than the previous day. He reports another "visitor" this AM and says that a portal opened up with a passcode only given to a select few. He reports feeling proud because sent Ashtay and the blue figure through the portal. When asked if other people can see Ashtay or his other VH, patient reports they cannot. When asked what that means to him, he reports that he feels disappointed because that means "I am not getting better". Encouragement provided and counseling regarding medication increase provided.   With regard to his somatic symptom patient reports improvement in his RLE pain and swelling. He does however complain of shooting pains for 3 wks duration and asks for more gabapentin.   Attempted to contact Case Manager of patient (Ms. Tenny CrawRoss, at [phone]) to determine psychiatric baseline. No answer x2, no voicemail.   Principal Problem: Schizoaffective disorder (HCC) Diagnosis: Principal Problem:   Schizoaffective disorder (HCC) Active Problems:   HIV INFECTION   PTSD (post-traumatic stress disorder)  Total Time spent in direct patient care: 1 hour  Past Psychiatric History: as above  Past Medical History:  Past Medical History:  Diagnosis Date   Depression    Episodic mood disorder (HCC)    Generalized anxiety disorder    HIV (human immunodeficiency virus infection) (HCC)    HTN (hypertension)    Panic attack    Paranoid schizophrenia (HCC)    Personality disorder (HCC)    Schizoaffective disorder, bipolar type (HCC)     Past Surgical History:   Procedure Laterality Date   SKIN GRAFT Right    foot/leg   Family History:  Family History  Problem Relation Age of Onset   Sinusitis Mother    Mental illness Father    Hypertension Maternal Grandmother    Diabetes Maternal Grandmother    Family Psychiatric  History: negative Social History:  Social History   Substance and Sexual Activity  Alcohol Use No   Alcohol/week: 0.0 standard drinks   Comment: History of, but not currently     Social History   Substance and Sexual Activity  Drug Use No    Social History   Socioeconomic History   Marital status: Single    Spouse name: Not on file   Number of children: 0   Years of education: college   Highest education level: Not on file  Occupational History   Occupation: UNEMPLOYED    Employer: UNEMPLOYED   Occupation: Consulting civil engineerstudent  Tobacco Use   Smoking status: Every Day    Packs/day: 0.50    Years: 10.00    Pack years: 5.00    Types: Cigarettes   Smokeless tobacco: Never   Tobacco comments:    would like patches  Vaping Use   Vaping Use: Never used  Substance and Sexual Activity   Alcohol use: No    Alcohol/week: 0.0 standard drinks    Comment: History of, but not currently   Drug use: No   Sexual activity: Never    Partners: Female, Male    Birth control/protection: Condom  Comment: pt given condoms 08/2020  Other Topics Concern   Not on file  Social History Narrative   Not on file   Social Determinants of Health   Financial Resource Strain: Not on file  Food Insecurity: Not on file  Transportation Needs: Not on file  Physical Activity: Not on file  Stress: Not on file  Social Connections: Not on file   Additional Social History:     Sleep: poor  Appetite:  Fair  Current Medications: Current Facility-Administered Medications  Medication Dose Route Frequency Provider Last Rate Last Admin   acetaminophen (TYLENOL) tablet 650 mg  650 mg Oral Q6H PRN Antonieta Pert, MD   650 mg at 11/28/20  2135   alum & mag hydroxide-simeth (MAALOX/MYLANTA) 200-200-20 MG/5ML suspension 30 mL  30 mL Oral Q4H PRN Antonieta Pert, MD   30 mL at 11/28/20 0302   atorvastatin (LIPITOR) tablet 10 mg  10 mg Oral Daily Antonieta Pert, MD   10 mg at 12/02/20 0747   benztropine (COGENTIN) tablet 0.5 mg  0.5 mg Oral BID Carlyn Reichert, MD   0.5 mg at 12/02/20 0748   clonazePAM (KLONOPIN) tablet 0.5 mg  0.5 mg Oral BID Antonieta Pert, MD   0.5 mg at 12/02/20 0750   dolutegravir (TIVICAY) tablet 50 mg  50 mg Oral Daily Antonieta Pert, MD   50 mg at 12/02/20 0748   emtricitabine-tenofovir AF (DESCOVY) 200-25 MG per tablet 1 tablet  1 tablet Oral Daily Antonieta Pert, MD   1 tablet at 12/02/20 0747   escitalopram (LEXAPRO) tablet 10 mg  10 mg Oral Daily Carlyn Reichert, MD   10 mg at 12/02/20 0747   gabapentin (NEURONTIN) capsule 100 mg  100 mg Oral q1800 Carlyn Reichert, MD   100 mg at 12/01/20 1726   hydrALAZINE (APRESOLINE) tablet 25 mg  25 mg Oral Q8H PRN Ronaldo Miyamoto, Tyrone A, DO       hydrochlorothiazide (MICROZIDE) capsule 12.5 mg  12.5 mg Oral Daily Alanda Slim, Taye T, MD   12.5 mg at 12/02/20 0750   hydrOXYzine (ATARAX/VISTARIL) tablet 25 mg  25 mg Oral QHS Carlyn Reichert, MD       hydrOXYzine (ATARAX/VISTARIL) tablet 25 mg  25 mg Oral BID PRN Carlyn Reichert, MD       loperamide (IMODIUM) capsule 2 mg  2 mg Oral TID PRN Carlyn Reichert, MD   2 mg at 11/29/20 0530   magnesium hydroxide (MILK OF MAGNESIA) suspension 30 mL  30 mL Oral Daily PRN Antonieta Pert, MD       nicotine (NICODERM CQ - dosed in mg/24 hours) patch 14 mg  14 mg Transdermal Daily Antonieta Pert, MD   14 mg at 12/02/20 5400   oxyCODONE-acetaminophen (PERCOCET/ROXICET) 5-325 MG per tablet 1 tablet  1 tablet Oral Q8H PRN Comer Locket, MD       [START ON 12/07/2020] Semaglutide(0.25 or 0.5MG /DOS) Namon Cirri 0.5 mg  0.5 mg Injection Weekly Carlyn Reichert, MD       traZODone (DESYREL) tablet 50 mg  50 mg Oral QHS PRN Carlyn Reichert, MD       ziprasidone (GEODON) capsule 80 mg  80 mg Oral BID WC Carlyn Reichert, MD   80 mg at 12/02/20 8676    Lab Results:  Results for orders placed or performed during the hospital encounter of 11/24/20 (from the past 48 hour(s))  Renal function panel     Status: Abnormal   Collection Time:  11/30/20  6:28 PM  Result Value Ref Range   Sodium 140 135 - 145 mmol/L   Potassium 3.8 3.5 - 5.1 mmol/L   Chloride 105 98 - 111 mmol/L   CO2 26 22 - 32 mmol/L   Glucose, Bld 129 (H) 70 - 99 mg/dL    Comment: Glucose reference range applies only to samples taken after fasting for at least 8 hours.   BUN 12 6 - 20 mg/dL   Creatinine, Ser 9.47 0.61 - 1.24 mg/dL   Calcium 8.9 8.9 - 09.6 mg/dL   Phosphorus 3.8 2.5 - 4.6 mg/dL   Albumin 4.3 3.5 - 5.0 g/dL   GFR, Estimated >28 >36 mL/min    Comment: (NOTE) Calculated using the CKD-EPI Creatinine Equation (2021)    Anion gap 9 5 - 15    Comment: Performed at [facility], [address] [place], [place] [address]  Glucose, capillary     Status: None   Collection Time: 12/01/20  5:56 AM  Result Value Ref Range   Glucose-Capillary 98 70 - 99 mg/dL    Comment: Glucose reference range applies only to samples taken after fasting for at least 8 hours.  Brain natriuretic peptide     Status: None   Collection Time: 12/01/20  6:27 AM  Result Value Ref Range   B Natriuretic Peptide 16.0 0.0 - 100.0 pg/mL    Comment: Performed at [facility], [address], [place], [place] [address]  Glucose, capillary     Status: None   Collection Time: 12/02/20  5:43 AM  Result Value Ref Range   Glucose-Capillary 94 70 - 99 mg/dL    Comment: Glucose reference range applies only to samples taken after fasting for at least 8 hours.  Renal function panel     Status: Abnormal   Collection Time: 12/02/20  6:06 AM  Result Value Ref Range   Sodium 139 135 - 145 mmol/L   Potassium 4.1 3.5 - 5.1 mmol/L   Chloride 105 98 - 111  mmol/L   CO2 28 22 - 32 mmol/L   Glucose, Bld 87 70 - 99 mg/dL    Comment: Glucose reference range applies only to samples taken after fasting for at least 8 hours.   BUN 12 6 - 20 mg/dL   Creatinine, Ser 0.35 (H) 0.61 - 1.24 mg/dL   Calcium 8.8 (L) 8.9 - 10.3 mg/dL   Phosphorus 3.6 2.5 - 4.6 mg/dL   Albumin 4.1 3.5 - 5.0 g/dL   GFR, Estimated >46 >56 mL/min    Comment: (NOTE) Calculated using the CKD-EPI Creatinine Equation (2021)    Anion gap 6 5 - 15    Comment: Performed at [facility], [address], [place], [place] [address]  Magnesium     Status: None   Collection Time: 12/02/20  6:06 AM  Result Value Ref Range   Magnesium 1.8 1.7 - 2.4 mg/dL    Comment: Performed at [facility], [address], [place], [place] [address]  CK     Status: None   Collection Time: 12/02/20  6:06 AM  Result Value Ref Range   Total CK 280 49 - 397 U/L    Comment: Performed at [facility], [address] [place], [place] [address]  CBC     Status: None   Collection Time: 12/02/20  6:06 AM  Result Value Ref Range   WBC 7.4 4.0 - 10.5 K/uL  RBC 4.48 4.22 - 5.81 MIL/uL   Hemoglobin 14.4 13.0 - 17.0 g/dL   HCT 96.0 45.4 - 09.8 %   MCV 95.1 80.0 - 100.0 fL   MCH 32.1 26.0 - 34.0 pg   MCHC 33.8 30.0 - 36.0 g/dL   RDW 11.9 14.7 - 82.9 %   Platelets 227 150 - 400 K/uL   nRBC 0.0 0.0 - 0.2 %    Comment: Performed at [facility], [address], [place], [place] [address]    Blood Alcohol level:  Lab Results  Component Value Date   ETH <10 11/23/2020   ETH <10 05/08/2017    Metabolic Disorder Labs: Lab Results  Component Value Date   HGBA1C 5.9 (H) 11/25/2020   MPG 122.63 11/25/2020   MPG 117 (H) 06/17/2013   No results found for: PROLACTIN Lab Results  Component Value Date   CHOL 153 11/25/2020   TRIG 99 11/25/2020   HDL 30 (L) 11/25/2020   CHOLHDL 5.1 11/25/2020   VLDL 20 11/25/2020   LDLCALC  103 (H) 11/25/2020   LDLCALC 97 04/13/2020    Physical Findings: AIMS: 0 for jaw movements  CIWA:    COWS:     Musculoskeletal: Strength & Muscle Tone: within normal limits Gait & Station: normal Patient leans: N/A  Psychiatric Specialty Exam:  Presentation  General Appearance: Casual , obese, adequate hygiene  Eye Contact:Good  Speech:Clear and Coherent  Speech Volume:Normal  Handedness:Right   Mood and Affect  Mood: "okay"  Affect: congruent  Thought Process  Thought Processes: coherent, logical  Descriptions of Associations:Intact  Orientation:Full (Time, Place and Person)  Thought Content: describes bizarre beliefs about "portals" and residual AVH; denies ideas of reference or first rank symptoms  History of Schizophrenia/Schizoaffective disorder:Yes  Duration of Psychotic Symptoms:Greater than six months  Hallucinations: AVH as described above  Ideas of Reference:None  Suicidal Thoughts: SI with a plan, contracts for safety  Homicidal Thoughts:denies  Sensorium  Memory:Immediate Fair; Recent Fair; Remote Fair  Judgment:Fair  Insight:Fair   Executive Functions  Concentration:Fair  Attention Span:Fair  Recall:Fair  Fund of Knowledge:Fair  Language:Fair   Psychomotor Activity  Psychomotor Activity: nml   Assets  Assets:Housing   Sleep  Sleep:Sleep: Poor Number of Hours of Sleep: 3.25   Physical Exam Constitutional:      Appearance: He is obese.  HENT:     Head: Normocephalic and atraumatic.  Eyes:     Extraocular Movements: Extraocular movements intact.  Cardiovascular:     Rate and Rhythm: Normal rate and regular rhythm.  Pulmonary:     Effort: Pulmonary effort is normal.  Musculoskeletal:        General: Normal range of motion.  Neurological:     General: No focal deficit present.     Mental Status: He is alert.    Review of Systems  Respiratory:  Negative for shortness of breath.   Cardiovascular:   Negative for chest pain.  Neurological:  Negative for headaches.  Blood pressure 123/82, pulse 84, temperature 98.5 F (36.9 C), temperature source Oral, resp. rate 20, height  (1.854 m), weight (!) 149.7 kg, SpO2 99 %. Body mass index is 43.54 kg/m.   Safety and Monitoring -- VOLUNTARY admission to inpatient psychiatric unit for safety, stabilization and treatment -- Daily contact with patient to assess and evaluate symptoms and progress in treatment -- Patient's case to be discussed in multi-disciplinary team meeting -- Observation Level : q15 minute checks -- Vital signs:  q12 hours -- Precautions: suicide  Psychosis, PPHx of Schizoaffective d/o and MDD w psychotic features AP labs: A1C 5.9, lipids with LDL 103 HDL 30 (on atorvastatin), EKG w sinus brady, Qtc of 400 -Increase to Geodon 80 mg bid (started 7/22) given worsening psychosis  EKG (7/17): NSR, Qtc 431 ; Repeat: NSR with QTC 427 ; Repeat EKG ordered for 7/24 -Continue cogentin 0.5 mg bid for jaw movements (Pt subjectively and objectively improved) - Start Vistaril 25mg  qhs scheduled for help with insomnia  Anxiety and depression -Klonopin 0.5 mg bid for anxiety and panic symptoms (patient takes PRN as OP) -Paxil 5 mg (started 7/14), increased to 10 mg 7/16 given good tolerability - D/C'd 7/18 due to GI side effects  -Imodium tid PRN for diarrhea -Continue Lexapro 10 mg (7/21) for MDD  Restless Leg Syndrome and neuropathic pain Patient complaining of frequent urge to move his legs at night, improved Now complaining of neuropathic pain -Increase gabapentin to 100 mg TID (titrate to 300 mg)  Tobacco use disorder -Nicoderm patch 14 mg  Continue PRN's: Tylenol, Maalox, Atarax, Milk of Magnesia, Trazodone (decrease to 25 mg given incontinence)   Medical management  HIV: continue home meds: Tivicay 50 mg PO daily, Descovy 200-25 mg PO daily -Last VL undetectable, last CD4 1,700 (08/2020) -Recheck on 7/16: VL (<20)  and CD4 (1724) -Monitor for signs of infection  R leg swelling DVT study negative, BNP wnl, symptoms improved Amlodipine D/C'd and replaced with 12.5 mg HCTZ, monitor K with regular BMPs (7/22 K of 4.1) - Recheck 7/25 then 1 wk later Patient reports improvement in symptoms  Type 2 DM  continue semaglutide injection weekly (last dose 7/20), well controlled with A1C of 5.9 (technically does not meet criteria for DM) -Monitor with daily AM POC glucose  Admission labs:  BMP w Cr of 1.32 on admission, 1.24 (7/17)  CBC wnl  UDS with benzos (prescribed) and THC   Dispo: continue to contact case manager  Treatment Plan Summary: Daily contact with patient to assess and evaluate symptoms and progress in treatment and Medication management   06-19-1979 PGY-1, Psychiatry

## 2020-12-02 NOTE — [facility] Group Notes (Signed)
Type of Group and Topic: Psychoeducational Group: Discharge Planning   Participation Level: Did not attend    Description of Group   Discharge planning group reviews patient's anticipated discharge plans and assists patients to anticipate and address any barriers to wellness/recovery in the community. Suicide prevention education is reviewed with patients in group.   Therapeutic Goals   1. Patients will state their anticipated discharge plan and mental health aftercare   2. Patients will identify potential barriers to wellness in the community setting   3. Patients will engage in problem solving, solution focused discussion of ways to anticipate and address barriers to wellness/recovery   Summary of Patient Progress: Did not attend  

## 2020-12-03 LAB — GLUCOSE, CAPILLARY: Glucose-Capillary: 90 mg/dL (ref 70–99)

## 2020-12-03 MED ORDER — GABAPENTIN 100 MG PO CAPS
200.0000 mg | ORAL_CAPSULE | Freq: Three times a day (TID) | ORAL | Status: DC
  Administered 2020-12-04 – 2020-12-06 (×6): 200 mg via ORAL
  Filled 2020-12-03 (×11): qty 2

## 2020-12-03 NOTE — Progress Notes (Signed)
   12/03/20 1700  Psych Admission Type (Psych Patients Only)  Admission Status Voluntary  Psychosocial Assessment  Patient Complaints Anxiety;Depression  Eye Contact Fair  Facial Expression Anxious;Animated  Affect Appropriate to circumstance  Speech Logical/coherent  Interaction Assertive  Motor Activity Slow  Appearance/Hygiene Unremarkable  Behavior Characteristics Cooperative  Mood Anxious;Pleasant  Thought Process  Coherency Concrete thinking  Content WDL  Delusions None reported or observed  Perception Hallucinations  Hallucination Auditory;Visual  Judgment Impaired  Confusion None  Danger to Self  Current suicidal ideation? Denies  Danger to Others  Danger to Others None reported or observed

## 2020-12-03 NOTE — Progress Notes (Signed)
   12/02/20 2216  Psych Admission Type (Psych Patients Only)  Admission Status Voluntary  Psychosocial Assessment  Patient Complaints Anxiety;Depression  Eye Contact Fair  Facial Expression Anxious;Animated  Affect Appropriate to circumstance  Speech Logical/coherent  Interaction Assertive  Motor Activity Slow  Appearance/Hygiene Unremarkable  Behavior Characteristics Cooperative;Appropriate to situation  Mood Anxious;Pleasant  Thought Process  Coherency Concrete thinking  Content WDL  Delusions None reported or observed  Perception Hallucinations  Hallucination Auditory;Visual  Judgment Impaired  Confusion None  Danger to Self  Current suicidal ideation? Denies  Danger to Others  Danger to Others None reported or observed

## 2020-12-03 NOTE — [facility] Group Notes (Signed)
Adult Psychoeducational Group Note  Date:  12/03/2020 Time:  10:16 PM  Group Topic/Focus:  Wrap-Up Group:   The focus of this group is to help patients review their daily goal of treatment and discuss progress on daily workbooks.  Participation Level:  Active  Participation Quality:  Attentive  Affect:  Appropriate  Cognitive:  Alert  Insight: Good  Engagement in Group:  Engaged  Modes of Intervention:  Discussion, Education, and Support  Additional Comments:  pt shared that he was able to follow up and receive his compression socks. He shared that today he wanted to work on his negative thinking. To address his way of thinking for every negative thought he would have he would write down something positive.  Maura Crandall Cassandra 12/03/2020, 10:16 PM

## 2020-12-03 NOTE — Progress Notes (Addendum)
[facility]  12/03/2020 5:14 PM Geni BersJames Bonaventure  MRN:  [id number] Subjective:   Geni BersJames Derosa is a [age] YOM with a PMHx of HIV and DM and a PPHx of MDD with psychotic features, schizoaffective disorder (bipolar vs depressed type) presenting voluntarily for command AH and SI with a plan.   24 hr events: no documented behavioral issues, no PRN medications given for agitation.   On interview this morning patient appears tired, he is lying in bed with 2 pillows.  His thought process today was coherent and linear.  He is alert and oriented x4.  Patient stated that his mood is "low down and tired".  Said that he slept "so-so".  Stated that his appetite is "fair".  Patient stated that he had another "episode", where he saw 3 people in chairs in the corner of the room.  He stated that the people did not speak to him or engage him.  Patient denied AH/HI.  However patient admitted to passive SI, stating he has thoughts telling him to "die [name]."  Patient stated that these thoughts are fleeting and he can contract for safety on the unit.   With regard to his somatic symptom patient reports improvement in his RLE pain and swelling.  Patient stating he is waiting for his compression socks.  Stated that the gabapentin has helped a lot, but still has lingering neuropathy.  Patient had complaints of sleep apnea symptoms, including waking up multiple times a night feeling like he is panicked, daytime somnolence, morning headaches, snoring.  Stated that he has had these symptoms for a while now.  This Thereasa Parkinauthor stated that the patient should discuss these symptoms with his PCP.  Principal Problem: Schizoaffective disorder (HCC) Diagnosis: Principal Problem:   Schizoaffective disorder (HCC) Active Problems:   HIV INFECTION   PTSD (post-traumatic stress disorder)  Total Time spent in direct patient care: 1 hour  Past Psychiatric History: as above  Past Medical History:  Past Medical History:  Diagnosis Date    Depression    Episodic mood disorder (HCC)    Generalized anxiety disorder    HIV (human immunodeficiency virus infection) (HCC)    HTN (hypertension)    Panic attack    Paranoid schizophrenia (HCC)    Personality disorder (HCC)    Schizoaffective disorder, bipolar type (HCC)     Past Surgical History:  Procedure Laterality Date   SKIN GRAFT Right    foot/leg   Family History:  Family History  Problem Relation Age of Onset   Sinusitis Mother    Mental illness Father    Hypertension Maternal Grandmother    Diabetes Maternal Grandmother    Family Psychiatric  History: negative Social History:  Social History   Substance and Sexual Activity  Alcohol Use No   Alcohol/week: 0.0 standard drinks   Comment: History of, but not currently     Social History   Substance and Sexual Activity  Drug Use No    Social History   Socioeconomic History   Marital status: Single    Spouse name: Not on file   Number of children: 0   Years of education: college   Highest education level: Not on file  Occupational History   Occupation: UNEMPLOYED    Employer: UNEMPLOYED   Occupation: Consulting civil engineerstudent  Tobacco Use   Smoking status: Every Day    Packs/day: 0.50    Years: 10.00    Pack years: 5.00    Types: Cigarettes   Smokeless tobacco: Never  Tobacco comments:    would like patches  Vaping Use   Vaping Use: Never used  Substance and Sexual Activity   Alcohol use: No    Alcohol/week: 0.0 standard drinks    Comment: History of, but not currently   Drug use: No   Sexual activity: Never    Partners: Female, Male    Birth control/protection: Condom    Comment: pt given condoms 08/2020  Other Topics Concern   Not on file  Social History Narrative   Not on file   Social Determinants of Health   Financial Resource Strain: Not on file  Food Insecurity: Not on file  Transportation Needs: Not on file  Physical Activity: Not on file  Stress: Not on file  Social Connections: Not on  file   Additional Social History:     Sleep: poor  Appetite:  Fair  Current Medications: Current Facility-Administered Medications  Medication Dose Route Frequency Provider Last Rate Last Admin   acetaminophen (TYLENOL) tablet 650 mg  650 mg Oral Q6H PRN Antonieta Pert, MD   650 mg at 11/28/20 2135   alum & mag hydroxide-simeth (MAALOX/MYLANTA) 200-200-20 MG/5ML suspension 30 mL  30 mL Oral Q4H PRN Antonieta Pert, MD   30 mL at 11/28/20 0302   atorvastatin (LIPITOR) tablet 10 mg  10 mg Oral Daily Antonieta Pert, MD   10 mg at 12/03/20 9509   benztropine (COGENTIN) tablet 0.5 mg  0.5 mg Oral BID Carlyn Reichert, MD   0.5 mg at 12/03/20 3267   clonazePAM (KLONOPIN) tablet 0.5 mg  0.5 mg Oral BID Antonieta Pert, MD   0.5 mg at 12/03/20 1245   dolutegravir (TIVICAY) tablet 50 mg  50 mg Oral Daily Antonieta Pert, MD   50 mg at 12/03/20 8099   emtricitabine-tenofovir AF (DESCOVY) 200-25 MG per tablet 1 tablet  1 tablet Oral Daily Antonieta Pert, MD   1 tablet at 12/03/20 0829   escitalopram (LEXAPRO) tablet 10 mg  10 mg Oral Daily Carlyn Reichert, MD   10 mg at 12/03/20 0829   [START ON 12/04/2020] gabapentin (NEURONTIN) capsule 200 mg  200 mg Oral TID Princess Bruins, DO       hydrALAZINE (APRESOLINE) tablet 25 mg  25 mg Oral Q8H PRN Ronaldo Miyamoto, Tyrone A, DO       hydrochlorothiazide (MICROZIDE) capsule 12.5 mg  12.5 mg Oral Daily Gonfa, Taye T, MD   12.5 mg at 12/03/20 1017   hydrOXYzine (ATARAX/VISTARIL) tablet 25 mg  25 mg Oral QHS Carlyn Reichert, MD   25 mg at 12/02/20 2216   hydrOXYzine (ATARAX/VISTARIL) tablet 25 mg  25 mg Oral BID PRN Carlyn Reichert, MD       loperamide (IMODIUM) capsule 2 mg  2 mg Oral TID PRN Carlyn Reichert, MD   2 mg at 11/29/20 0530   magnesium hydroxide (MILK OF MAGNESIA) suspension 30 mL  30 mL Oral Daily PRN Antonieta Pert, MD       nicotine (NICODERM CQ - dosed in mg/24 hours) patch 14 mg  14 mg Transdermal Daily Antonieta Pert, MD   14  mg at 12/03/20 8338   oxyCODONE-acetaminophen (PERCOCET/ROXICET) 5-325 MG per tablet 1 tablet  1 tablet Oral Q8H PRN Comer Locket, MD       [START ON 12/07/2020] Semaglutide(0.25 or 0.5MG /DOS) Namon Cirri 0.5 mg  0.5 mg Injection Weekly Carlyn Reichert, MD       traZODone (DESYREL) tablet 50 mg  50  mg Oral QHS PRN Carlyn Reichert, MD       ziprasidone (GEODON) capsule 80 mg  80 mg Oral BID WC Carlyn Reichert, MD   80 mg at 12/03/20 1610    Lab Results:  Results for orders placed or performed during the hospital encounter of 11/24/20 (from the past 48 hour(s))  Glucose, capillary     Status: None   Collection Time: 12/02/20  5:43 AM  Result Value Ref Range   Glucose-Capillary 94 70 - 99 mg/dL    Comment: Glucose reference range applies only to samples taken after fasting for at least 8 hours.  Renal function panel     Status: Abnormal   Collection Time: 12/02/20  6:06 AM  Result Value Ref Range   Sodium 139 135 - 145 mmol/L   Potassium 4.1 3.5 - 5.1 mmol/L   Chloride 105 98 - 111 mmol/L   CO2 28 22 - 32 mmol/L   Glucose, Bld 87 70 - 99 mg/dL    Comment: Glucose reference range applies only to samples taken after fasting for at least 8 hours.   BUN 12 6 - 20 mg/dL   Creatinine, Ser 9.60 (H) 0.61 - 1.24 mg/dL   Calcium 8.8 (L) 8.9 - 10.3 mg/dL   Phosphorus 3.6 2.5 - 4.6 mg/dL   Albumin 4.1 3.5 - 5.0 g/dL   GFR, Estimated >45 >40 mL/min    Comment: (NOTE) Calculated using the CKD-EPI Creatinine Equation (2021)    Anion gap 6 5 - 15    Comment: Performed at [facility], [address] [place], [place] [address]  Magnesium     Status: None   Collection Time: 12/02/20  6:06 AM  Result Value Ref Range   Magnesium 1.8 1.7 - 2.4 mg/dL    Comment: Performed at [facility], [address] [place], [place] [address]  CK     Status: None   Collection Time: 12/02/20  6:06 AM  Result Value Ref Range   Total CK 280 49 - 397 U/L    Comment: Performed at  [facility], [address], [place], [place] [address]  CBC     Status: None   Collection Time: 12/02/20  6:06 AM  Result Value Ref Range   WBC 7.4 4.0 - 10.5 K/uL   RBC 4.48 4.22 - 5.81 MIL/uL   Hemoglobin 14.4 13.0 - 17.0 g/dL   HCT 30.8 65.7 - 84.6 %   MCV 95.1 80.0 - 100.0 fL   MCH 32.1 26.0 - 34.0 pg   MCHC 33.8 30.0 - 36.0 g/dL   RDW 96.2 95.2 - 84.1 %   Platelets 227 150 - 400 K/uL   nRBC 0.0 0.0 - 0.2 %    Comment: Performed at [facility], [address] [place], [place] [address]  Glucose, capillary     Status: Abnormal   Collection Time: 12/02/20  8:43 PM  Result Value Ref Range   Glucose-Capillary 135 (H) 70 - 99 mg/dL    Comment: Glucose reference range applies only to samples taken after fasting for at least 8 hours.  Glucose, capillary     Status: None   Collection Time: 12/03/20  6:19 AM  Result Value Ref Range   Glucose-Capillary 90 70 - 99 mg/dL    Comment: Glucose reference range applies only to samples taken after fasting for at least 8 hours.    Blood Alcohol level:  Lab Results  Component Value  Date   ETH <10 11/23/2020   ETH <10 05/08/2017    Metabolic Disorder Labs: Lab Results  Component Value Date   HGBA1C 5.9 (H) 11/25/2020   MPG 122.63 11/25/2020   MPG 117 (H) 06/17/2013   No results found for: PROLACTIN Lab Results  Component Value Date   CHOL 153 11/25/2020   TRIG 99 11/25/2020   HDL 30 (L) 11/25/2020   CHOLHDL 5.1 11/25/2020   VLDL 20 11/25/2020   LDLCALC 103 (H) 11/25/2020   LDLCALC 97 04/13/2020    Physical Findings: AIMS: 2, aware, mild distress CIWA:    COWS:     Musculoskeletal: Strength & Muscle Tone: within normal limits Gait & Station: normal Patient leans: N/A  Psychiatric Specialty Exam:  Presentation  General Appearance: Appropriate for Environment; Fairly Groomed , obese, adequate hygiene  Eye Contact:Good  Speech:Clear and Coherent; Normal Rate  Speech  Volume:Normal  Handedness:Right   Mood and Affect  Mood: "Low down and tired" - appears mildly dysphoric  Affect: constricted  Thought Process  Thought Processes: Superficially goal directed but concrete  Orientation:Full (Time, Place and Person)  Thought Content: Patient described seeing 3 men and chairs in the corner of his room that did not interact or speak with him. He denies AH today and denies ideas of reference or first rank symptoms; he is not grossly responding to internal/external stimuli on exam and can reality test by stating her know VH are not real History of Schizophrenia/Schizoaffective disorder:Yes  Duration of Psychotic Symptoms:Greater than six months  Hallucinations: VH as described above  Ideas of Reference:None  Suicidal Thoughts: SI with a plan, contracts for safety  Homicidal Thoughts:denies  Sensorium  Memory:Immediate Good; Remote Good; Recent Good  Judgment:Fair  Insight:Fair   Executive Functions  Concentration:Good  Attention Span:Good  Recall:Good  Fund of Knowledge:Good  Language:Good   Psychomotor Activity  Psychomotor Activity: nml   Assets  Assets:Communication Skills; Desire for Improvement; Resilience   Sleep  Sleep:Sleep: Poor Number of Hours of Sleep: 5.5   Physical Exam Constitutional:      Appearance: He is obese.  HENT:     Head: Normocephalic and atraumatic.  Eyes:     Extraocular Movements: Extraocular movements intact.  Cardiovascular:     Rate and Rhythm: Normal rate and regular rhythm.  Pulmonary:     Effort: Pulmonary effort is normal.  Musculoskeletal:        General: Normal range of motion.  Neurological:     General: No focal deficit present.     Mental Status: He is alert and oriented to person, place, and time.    Review of Systems  Constitutional:  Negative for fever.  HENT:  Negative for congestion, sinus pain and sore throat.   Eyes:  Negative for blurred vision.  Respiratory:   Negative for cough and shortness of breath.   Cardiovascular:  Negative for chest pain.  Gastrointestinal:  Negative for abdominal pain, constipation, diarrhea and heartburn.  Neurological:  Negative for headaches.  Psychiatric/Behavioral:  Positive for depression and suicidal ideas. The patient has insomnia.   Blood pressure 125/83, pulse 87, temperature 98.3 F (36.8 C), temperature source Oral, resp. rate 20, height 6\' 1"  (1.854 m), weight (!) 149.7 kg, SpO2 96 %. Body mass index is 43.54 kg/m.   Safety and Monitoring -- VOLUNTARY admission to inpatient psychiatric unit for safety, stabilization and treatment -- Daily contact with patient to assess and evaluate symptoms and progress in treatment -- Patient's case to be discussed  in multi-disciplinary team meeting -- Observation Level : q15 minute checks -- Vital signs:  q12 hours -- Precautions: suicide  Psychosis, PPHx of Schizoaffective d/o and MDD w psychotic features AP labs: A1C 5.9, lipids with LDL 103 HDL 30 (on atorvastatin), EKG w sinus brady, Qtc of 400 -Continue Geodon 80 mg bid (started 7/22) given worsening psychosis  EKG (7/17): NSR, Qtc 431 ; Repeat: NSR with QTC 427 ; Repeat EKG ordered for 7/24 -Continue cogentin 0.5 mg bid for jaw movements (Pt subjectively and objectively improved) - Start Vistaril 25mg  qhs scheduled for help with insomnia  Anxiety and depression -Klonopin 0.5 mg bid for anxiety and panic symptoms (patient takes PRN as OP) -Paxil 5 mg (started 7/14), increased to 10 mg 7/16 given good tolerability - D/C'd 7/18 due to GI side effects  -Imodium tid PRN for diarrhea -Continue Lexapro 10 mg (7/21) for MDD  Restless Leg Syndrome and neuropathic pain Patient complaining of frequent urge to move his legs at night, improved Now complaining of neuropathic pain -Increase gabapentin to 200 mg TID   Tobacco use disorder -Nicoderm patch 14 mg  Continue PRN's: Tylenol, Maalox, Atarax, Milk of Magnesia,  Trazodone (decrease to 25 mg given incontinence)   Medical management  HIV: continue home meds: Tivicay 50 mg PO daily, Descovy 200-25 mg PO daily -Last VL undetectable, last CD4 1,700 (08/2020) -Recheck on 7/16: VL (<20) and CD4 (1724) -Monitor for signs of infection  R leg swelling DVT study negative, BNP wnl, symptoms improved Amlodipine D/C'd and replaced with 12.5 mg HCTZ, monitor K with regular BMPs (7/22 K of 4.1) - Recheck 7/25 then 1 wk later Patient reports improvement in symptoms  Type 2 DM  continue semaglutide injection weekly (last dose 7/20), well controlled with A1C of 5.9 (technically does not meet criteria for DM) -Monitor with daily AM POC glucose  Admission labs:  BMP w Cr of 1.32 on admission, 1.24 (7/17)  CBC wnl  UDS with benzos (prescribed) and THC   Dispo: continue to contact case manager  Treatment Plan Summary: Daily contact with patient to assess and evaluate symptoms and progress in treatment and Medication management   06-19-1979 PGY-1, Psychiatry

## 2020-12-03 NOTE — [facility] Group Notes (Signed)
LCSW Group Therapy Note  06/18/2020   10:00-11:00am   Topic:  Anger Triggers and Coping Skills  Participation Level:  Active  Description of Group:   In this group, patients learned how to recognize the physical, cognitive, emotional, and behavioral responses they have to anger-provoking situations.  They identified their own common triggers and typical reactions then analyzed how these reactions are possibly beneficial and possibly unhelpful.  Focus was placed on how helpful it is to recognize the underlying emotions to anger in order to address these for more permanent resolution.  Emphasis was also on identifying possible replacement thoughts for the automatic thoughts generated in various situations shared by the group.  Therapeutic Goals: Patients will share situations that commonly incite their anger and how they typically respond Patients will identify how their coping skills work for them and/or against them Patients will explore possible alternative thoughts to their automatic ones Patients will learn that anger itself is normal and that healthier reactions can assist with resolving conflict rather than worsening situations  Summary of Patient Progress:  The patient shared that his frequent cause anger is disrespect aimed at him or someone important to him and/or bullying and said he has recently tried to respond to this with grace, but previously would say something hurtful to try to hurt that other person.  He asked questions of CSW and other patients, was very attentive to the responses.  Therapeutic Modalities:   Cognitive Behavioral Therapy Processing  Lynnell Chad

## 2020-12-03 NOTE — Plan of Care (Signed)
  Problem: Education: Goal: Ability to state activities that reduce stress will improve Outcome: Not Progressing   Problem: Self-Concept: Goal: Level of anxiety will decrease Outcome: Not Progressing   Problem: Self-Concept: Goal: Will verbalize positive feelings about self Outcome: Not Progressing

## 2020-12-03 NOTE — Progress Notes (Signed)
   At approximately 0430, MHT reported that pt stated, "I see three people sitting in chairs against the wall."  Upon assessment, pt reported the same to RN.  Pt was comforted and assured room was safe by MHT and RN.

## 2020-12-03 NOTE — Progress Notes (Signed)
Patient asked staff to come in his room because there were 3 people sitting on a chair in his room. Patient said the people are in his room. Staff reassured patient there were no one in his room. Staff reassured patient I have been sitting on the hall.No one went in his room. I also do 15 minutes checks and no one has been in his room. RN notify.

## 2020-12-03 NOTE — Progress Notes (Signed)
Reviewed patient's chart.  Vital signs within normal.  Per staff report, edema seems to be improving.  He seems to be tolerating HCTZ.  Continue low-dose HCTZ, compression stocking, leg elevation and low-sodium diet.  Repeat renal function on 12/05/20.

## 2020-12-04 LAB — GLUCOSE, CAPILLARY
Glucose-Capillary: 98 mg/dL (ref 70–99)
Glucose-Capillary: 146 mg/dL — ABNORMAL HIGH (ref 70–99)

## 2020-12-04 NOTE — Progress Notes (Addendum)
[facility] MD Progress Note  12/04/2020 4:25 PM Alan Rangel  MRN:  [id number] Subjective:   Alan Rangel is a 52 YOM with a PMHx of HIV and DM and a PPHx of MDD with psychotic features, schizoaffective disorder (bipolar vs depressed type) presenting voluntarily for command AH and SI with a plan.   24 hr events: no documented behavioral issues, no PRN medications given for agitation.   On interview this morning patient appears similar to previous days. He reports subjective improvement in his AVH, but says that he sent the blue figure through a portal with a secret passcode yesterday. He says that shaving his face was a significant event, allowing him to feel a sense of accomplishment. He again requests information regarding discharge. Patient informed of plan to talk with case manager to better determine patient's baseline and continued work of medication management. Patient denies SI but reports fleeting HI with thoughts of running people over in a car. Patient reports being distraught by this thought and crying about it. He denies AH. He denies ideas of reference or first rank symptoms. ROS as below.   Principal Problem: Schizoaffective disorder (HCC) Diagnosis: Principal Problem:   Schizoaffective disorder (HCC) Active Problems:   HIV INFECTION   PTSD (post-traumatic stress disorder)  Total Time spent in direct patient care: 30 minutes  Past Psychiatric History: as above  Past Medical History:  Past Medical History:  Diagnosis Date   Depression    Episodic mood disorder (HCC)    Generalized anxiety disorder    HIV (human immunodeficiency virus infection) (HCC)    HTN (hypertension)    Panic attack    Paranoid schizophrenia (HCC)    Personality disorder (HCC)    Schizoaffective disorder, bipolar type (HCC)     Past Surgical History:  Procedure Laterality Date   SKIN GRAFT Right    foot/leg   Family History:  Family History  Problem Relation Age of Onset   Sinusitis Mother     Mental illness Father    Hypertension Maternal Grandmother    Diabetes Maternal Grandmother    Family Psychiatric  History: negative Social History:  Social History   Substance and Sexual Activity  Alcohol Use No   Alcohol/week: 0.0 standard drinks   Comment: History of, but not currently     Social History   Substance and Sexual Activity  Drug Use No    Social History   Socioeconomic History   Marital status: Single    Spouse name: Not on file   Number of children: 0   Years of education: college   Highest education level: Not on file  Occupational History   Occupation: UNEMPLOYED    Employer: UNEMPLOYED   Occupation: Consulting civil engineer  Tobacco Use   Smoking status: Every Day    Packs/day: 0.50    Years: 10.00    Pack years: 5.00    Types: Cigarettes   Smokeless tobacco: Never   Tobacco comments:    would like patches  Vaping Use   Vaping Use: Never used  Substance and Sexual Activity   Alcohol use: No    Alcohol/week: 0.0 standard drinks    Comment: History of, but not currently   Drug use: No   Sexual activity: Never    Partners: Female, Male    Birth control/protection: Condom    Comment: pt given condoms 08/2020  Other Topics Concern   Not on file  Social History Narrative   Not on file   Social Determinants of Health  Financial Resource Strain: Not on file  Food Insecurity: Not on file  Transportation Needs: Not on file  Physical Activity: Not on file  Stress: Not on file  Social Connections: Not on file   Additional Social History:     Sleep: fair  Appetite:  Fair  Current Medications: Current Facility-Administered Medications  Medication Dose Route Frequency Provider Last Rate Last Admin   acetaminophen (TYLENOL) tablet 650 mg  650 mg Oral Q6H PRN Antonieta Pert, MD   650 mg at 11/28/20 2135   alum & mag hydroxide-simeth (MAALOX/MYLANTA) 200-200-20 MG/5ML suspension 30 mL  30 mL Oral Q4H PRN Antonieta Pert, MD   30 mL at 11/28/20 0302    atorvastatin (LIPITOR) tablet 10 mg  10 mg Oral Daily Antonieta Pert, MD   10 mg at 12/04/20 0817   benztropine (COGENTIN) tablet 0.5 mg  0.5 mg Oral BID Carlyn Reichert, MD   0.5 mg at 12/04/20 7893   clonazePAM (KLONOPIN) tablet 0.5 mg  0.5 mg Oral BID Antonieta Pert, MD   0.5 mg at 12/04/20 0818   dolutegravir (TIVICAY) tablet 50 mg  50 mg Oral Daily Antonieta Pert, MD   50 mg at 12/04/20 0817   emtricitabine-tenofovir AF (DESCOVY) 200-25 MG per tablet 1 tablet  1 tablet Oral Daily Antonieta Pert, MD   1 tablet at 12/04/20 0817   escitalopram (LEXAPRO) tablet 10 mg  10 mg Oral Daily Carlyn Reichert, MD   10 mg at 12/04/20 8101   gabapentin (NEURONTIN) capsule 200 mg  200 mg Oral TID Princess Bruins, DO   200 mg at 12/04/20 7510   hydrALAZINE (APRESOLINE) tablet 25 mg  25 mg Oral Q8H PRN Ronaldo Miyamoto, Tyrone A, DO       hydrochlorothiazide (MICROZIDE) capsule 12.5 mg  12.5 mg Oral Daily Alanda Slim, Taye T, MD   12.5 mg at 12/04/20 0818   hydrOXYzine (ATARAX/VISTARIL) tablet 25 mg  25 mg Oral QHS Carlyn Reichert, MD   25 mg at 12/03/20 2103   hydrOXYzine (ATARAX/VISTARIL) tablet 25 mg  25 mg Oral BID PRN Carlyn Reichert, MD       loperamide (IMODIUM) capsule 2 mg  2 mg Oral TID PRN Carlyn Reichert, MD   2 mg at 11/29/20 0530   magnesium hydroxide (MILK OF MAGNESIA) suspension 30 mL  30 mL Oral Daily PRN Antonieta Pert, MD       nicotine (NICODERM CQ - dosed in mg/24 hours) patch 14 mg  14 mg Transdermal Daily Antonieta Pert, MD   14 mg at 12/04/20 0819   oxyCODONE-acetaminophen (PERCOCET/ROXICET) 5-325 MG per tablet 1 tablet  1 tablet Oral Q8H PRN Comer Locket, MD       [START ON 12/07/2020] Semaglutide(0.25 or 0.5MG /DOS) Namon Cirri 0.5 mg  0.5 mg Injection Weekly Carlyn Reichert, MD       traZODone (DESYREL) tablet 50 mg  50 mg Oral QHS PRN Carlyn Reichert, MD       ziprasidone (GEODON) capsule 80 mg  80 mg Oral BID WC Carlyn Reichert, MD   80 mg at 12/04/20 2585    Lab Results:   Results for orders placed or performed during the hospital encounter of 11/24/20 (from the past 48 hour(s))  Glucose, capillary     Status: Abnormal   Collection Time: 12/02/20  8:43 PM  Result Value Ref Range   Glucose-Capillary 135 (H) 70 - 99 mg/dL    Comment: Glucose reference range applies only to samples  taken after fasting for at least 8 hours.  Glucose, capillary     Status: None   Collection Time: 12/03/20  6:19 AM  Result Value Ref Range   Glucose-Capillary 90 70 - 99 mg/dL    Comment: Glucose reference range applies only to samples taken after fasting for at least 8 hours.  Glucose, capillary     Status: None   Collection Time: 12/04/20  5:51 AM  Result Value Ref Range   Glucose-Capillary 98 70 - 99 mg/dL    Comment: Glucose reference range applies only to samples taken after fasting for at least 8 hours.   Comment 1 Notify RN     Blood Alcohol level:  Lab Results  Component Value Date   ETH <10 11/23/2020   ETH <10 05/08/2017    Metabolic Disorder Labs: Lab Results  Component Value Date   HGBA1C 5.9 (H) 11/25/2020   MPG 122.63 11/25/2020   MPG 117 (H) 06/17/2013   No results found for: PROLACTIN Lab Results  Component Value Date   CHOL 153 11/25/2020   TRIG 99 11/25/2020   HDL 30 (L) 11/25/2020   CHOLHDL 5.1 11/25/2020   VLDL 20 11/25/2020   LDLCALC 103 (H) 11/25/2020   LDLCALC 97 04/13/2020    Physical Findings: AIMS: 0 for jaw movements (improved from previous days), moderate restlessness  Musculoskeletal: Strength & Muscle Tone: within normal limits Gait & Station: normal Patient leans: N/A  Psychiatric Specialty Exam:  Presentation  General Appearance: Casual, obese, adequate hygiene  Eye Contact:Good  Speech:Clear and Coherent  Speech Volume:Normal  Handedness:Right   Mood and Affect  Mood: "better" - appears calm and polite  Affect: constricted  Thought Process  Thought Processes: Superficially goal directed but  concrete  Orientation:Full (Time, Place and Person)  Thought Content: Pre-ocupied with sending a blue figure through a portal; denies AH, ideas of reference or first rank symptoms, denies paranoia  History of Schizophrenia/Schizoaffective disorder:Yes  Duration of Psychotic Symptoms:Greater than six months  Hallucinations: VH as described above  Ideas of Reference:None  Suicidal Thoughts: denies  Homicidal Thoughts: endorses, with plan, without intent or means toward random, unknown persons  Sensorium  Memory:Immediate Fair; Recent Fair; Remote Fair  Judgment:Fair  Insight:Fair   Executive Functions  Concentration:Fair  Attention Span:Fair  Recall:Fair  Fund of Knowledge:Fair  Language:Fair   Psychomotor Activity  Psychomotor Activity: Normal - steady gait   Assets  Assets:Housing, resilience    Sleep  Sleep:Sleep: Fair Number of Hours of Sleep: 6.75   Physical Exam Constitutional:      Appearance: He is obese.  HENT:     Head: Normocephalic and atraumatic.  Eyes:     Extraocular Movements: Extraocular movements intact.  Cardiovascular:     Rate and Rhythm: Normal rate and regular rhythm.  Pulmonary:     Effort: Pulmonary effort is normal.  Musculoskeletal:        General: Normal range of motion.  Neurological:     General: No focal deficit present.     Mental Status: He is alert and oriented to person, place, and time.    Review of Systems  Constitutional:  Negative for fever.  HENT:  Negative for congestion, sinus pain and sore throat.   Eyes:  Negative for blurred vision.  Respiratory:  Negative for cough and shortness of breath.   Cardiovascular:  Negative for chest pain.  Gastrointestinal:  Negative for abdominal pain, constipation, diarrhea and heartburn.  Neurological:  Negative for headaches.  Psychiatric/Behavioral:  Positive for depression. The patient has insomnia.   Blood pressure 121/79, pulse 87, temperature 98.3 F (36.8 C),  temperature source Oral, resp. rate 20, height 6\' 1"  (1.854 m), weight (!) 149.7 kg, SpO2 96 %. Body mass index is 43.54 kg/m.   Safety and Monitoring -- VOLUNTARY admission to inpatient psychiatric unit for safety, stabilization and treatment -- Daily contact with patient to assess and evaluate symptoms and progress in treatment -- Patient's case to be discussed in multi-disciplinary team meeting -- Observation Level : q15 minute checks -- Vital signs:  q12 hours -- Precautions: suicide  Psychosis, PPHx of Schizoaffective d/o and MDD w psychotic features AP labs: A1C 5.9, lipids with LDL 103 HDL 30 (on atorvastatin), EKG w sinus brady, Qtc of 400 -Continue Geodon 80 mg bid (started 7/22) attempting to assess patient's functional baseline via contact with case manager for collateral  EKG (7/17): NSR, Qtc 431 ; Repeat: NSR with QTC 427 ; Repeat EKG ordered for 7/24 for monitoring of QTC on increased geodon dose -Continue cogentin 0.5 mg bid for jaw movements (Pt subjectively and objectively improved) - Start Vistaril 25mg  qhs scheduled for help with insomnia  Anxiety and depression -Klonopin 0.5 mg bid for anxiety and panic symptoms (patient takes PRN as OP) -Paxil 5 mg (started 7/14), increased to 10 mg 7/16 given good tolerability - D/C'd 7/18 due to GI side effects  -Imodium tid PRN for diarrhea -Continue Lexapro 10 mg (7/21) for MDD  Restless Leg Syndrome and neuropathic pain Patient complaining of frequent urge to move his legs at night, improved Now complaining of neuropathic pain -Continue gabapentin 200 mg TID   Tobacco use disorder -Nicoderm patch 14 mg  Continue PRN's: Tylenol, Maalox, Atarax, Milk of Magnesia, Trazodone (decrease to 25 mg given incontinence)   Medical management  HIV: continue home meds: Tivicay 50 mg PO daily, Descovy 200-25 mg PO daily -Last VL undetectable, last CD4 1,700 (08/2020) -Recheck on 7/16: VL (<20) and CD4 (1724) -Monitor for signs of  infection  R leg swelling DVT study negative, BNP wnl, symptoms improved Amlodipine D/C'd and replaced with 12.5 mg HCTZ, monitor K with regular BMPs (7/22 K of 4.1) - Recheck Mag and BMP 7/25 then 1 wk later Patient reports improvement in symptoms  Type 2 DM  continue semaglutide injection weekly (last dose 7/20), well controlled with A1C of 5.9 (technically does not meet criteria for DM) -Monitor with daily AM POC glucose  Admission labs:  BMP w Cr of 1.32 on admission, 1.24 (7/17), 1.27 (7/22)  CBC wnl  UDS with benzos (prescribed) and THC   Dispo: continue to contact case manager  Treatment Plan Summary: Daily contact with patient to assess and evaluate symptoms and progress in treatment and Medication management   06-19-1979 PGY-1, Psychiatry

## 2020-12-04 NOTE — Progress Notes (Signed)
Pt has been alert and oriented to person, place, time and situation. Pt is calm, cooperative, denies suicidal and homicidal ideation, denies hallucinations at this time. Pt's affect is brighter than was noted 2 days ago when this writer was assigned to pt as pt's nurse. Pt smiles on contact at times, at other times pt has a flat affect. Pt attends groups, appetite is good, reports he slept well. Pt is medication compliant, and is noted to be social with select peers. Pt reports that his feelings of depression are better, reports feelings of anxiety are improving as well. Will continue to monitor pt per Q15 minute face checks and monitor for safety and progress.

## 2020-12-04 NOTE — Progress Notes (Signed)
     12/03/20 2305  Psych Admission Type (Psych Patients Only)  Admission Status Voluntary  Psychosocial Assessment  Patient Complaints Anxiety;Depression  Eye Contact Fair  Facial Expression Anxious;Animated  Affect Appropriate to circumstance  Speech Logical/coherent  Interaction Assertive  Motor Activity Slow  Appearance/Hygiene Unremarkable  Behavior Characteristics Cooperative  Mood Pleasant;Anxious  Thought Process  Coherency Concrete thinking  Content WDL  Delusions None reported or observed  Perception Hallucinations  Hallucination Auditory;Visual  Judgment Impaired  Confusion None  Danger to Self  Current suicidal ideation? Denies  Danger to Others  Danger to Others None reported or observed

## 2020-12-04 NOTE — Progress Notes (Signed)
Chart reviewed.  Vital signs within normal.  Continue low-dose HCTZ, compression socks, leg elevation and low-sodium diet.  Check renal panel and magnesium in the morning.  Please reach out by secure chat if any question about his edema or hypertension.

## 2020-12-04 NOTE — Progress Notes (Signed)
EKG completed at 18:45pm, results: NSR with nonspecific T wave abnormality Abnormal ECG reported to and hand delivered to Carlyn Reichert, MD at 18:50pm. Hard copy placed on chart.

## 2020-12-04 NOTE — Plan of Care (Signed)
  Problem: Self-Concept: Goal: Level of anxiety will decrease Outcome: Progressing   Problem: Coping: Goal: Coping ability will improve Outcome: Progressing Goal: Will verbalize feelings Outcome: Progressing   

## 2020-12-04 NOTE — [facility] Group Notes (Signed)
[facility] LCSW Group Therapy Note  Date/Time:  12/04/2020 10:00-11:00AM  Type of Therapy and Topic:  Group Therapy:  Healthy and Unhealthy Supports  Participation Level:  Active   Description of Group:  Patients in this group were introduced to the idea of adding a variety of healthy supports to address the various needs in their lives.Patients discussed what additional healthy supports could be helpful in their recovery and wellness after discharge in order to prevent future hospitalizations.   An emphasis was placed on using counselor, doctor, therapy groups, 12-step groups, and problem-specific support groups to expand supports.  Several songs were played to emphasize points made throughout group.  Therapeutic Goals:   1)  discuss importance of adding supports to stay well once out of the hospital  2)  compare healthy versus unhealthy supports and identify some examples of each  3)  generate ideas and descriptions of healthy supports that can be added  4)  offer mutual support about how to address unhealthy supports  5)  encourage active participation in and adherence to discharge plan    Summary of Patient Progress:  The patient stated that current healthy supports in his life are his 5yo godson that he considers a son and an old friend with whom he has reconnected while current unhealthy supports include himself.  The patient expressed a willingness to add support(s) to help in his recovery journey.   Therapeutic Modalities:   Motivational Interviewing Brief Solution-Focused Therapy  Ambrose Mantle, LCSW

## 2020-12-05 LAB — RENAL FUNCTION PANEL
Albumin: 4.2 g/dL (ref 3.5–5.0)
Anion gap: 8 (ref 5–15)
BUN: 10 mg/dL (ref 6–20)
CO2: 28 mmol/L (ref 22–32)
Calcium: 9.2 mg/dL (ref 8.9–10.3)
Chloride: 105 mmol/L (ref 98–111)
Creatinine, Ser: 1.3 mg/dL — ABNORMAL HIGH (ref 0.61–1.24)
GFR, Estimated: >60 mL/min (ref >60)
Glucose, Bld: 96 mg/dL (ref 70–99)
Phosphorus: 3.4 mg/dL (ref 2.5–4.6)
Potassium: 4.1 mmol/L (ref 3.5–5.1)
Sodium: 141 mmol/L (ref 135–145)

## 2020-12-05 LAB — GLUCOSE, CAPILLARY: Glucose-Capillary: 76 mg/dL (ref 70–99)

## 2020-12-05 LAB — MAGNESIUM: Magnesium: 1.9 mg/dL (ref 1.7–2.4)

## 2020-12-05 NOTE — Tx Team (Signed)
Interdisciplinary Treatment and Diagnostic Plan Update  12/05/2020 Time of Session: 9:35am Alan Rangel MRN: [id number]  Principal Diagnosis: Schizoaffective disorder [facility])  Secondary Diagnoses: Principal Problem:   Schizoaffective disorder (HCC) Active Problems:   HIV INFECTION   PTSD (post-traumatic stress disorder)   Current Medications:  Current Facility-Administered Medications  Medication Dose Route Frequency Provider Last Rate Last Admin   acetaminophen (TYLENOL) tablet 650 mg  650 mg Oral Q6H PRN Antonieta Pert, MD   650 mg at 11/28/20 2135   alum & mag hydroxide-simeth (MAALOX/MYLANTA) 200-200-20 MG/5ML suspension 30 mL  30 mL Oral Q4H PRN Antonieta Pert, MD   30 mL at 11/28/20 0302   atorvastatin (LIPITOR) tablet 10 mg  10 mg Oral Daily Antonieta Pert, MD   10 mg at 12/05/20 9798   benztropine (COGENTIN) tablet 0.5 mg  0.5 mg Oral BID Carlyn Reichert, MD   0.5 mg at 12/05/20 0941   clonazePAM (KLONOPIN) tablet 0.5 mg  0.5 mg Oral BID Antonieta Pert, MD   0.5 mg at 12/05/20 0941   dolutegravir (TIVICAY) tablet 50 mg  50 mg Oral Daily Antonieta Pert, MD   50 mg at 12/05/20 9211   emtricitabine-tenofovir AF (DESCOVY) 200-25 MG per tablet 1 tablet  1 tablet Oral Daily Antonieta Pert, MD   1 tablet at 12/05/20 0942   escitalopram (LEXAPRO) tablet 10 mg  10 mg Oral Daily Carlyn Reichert, MD   10 mg at 12/05/20 9417   gabapentin (NEURONTIN) capsule 200 mg  200 mg Oral TID Princess Bruins, DO   200 mg at 12/05/20 0941   hydrALAZINE (APRESOLINE) tablet 25 mg  25 mg Oral Q8H PRN Ronaldo Miyamoto, Tyrone A, DO       hydrochlorothiazide (MICROZIDE) capsule 12.5 mg  12.5 mg Oral Daily Candelaria Stagers T, MD   12.5 mg at 12/05/20 4081   hydrOXYzine (ATARAX/VISTARIL) tablet 25 mg  25 mg Oral QHS Carlyn Reichert, MD   25 mg at 12/04/20 2133   hydrOXYzine (ATARAX/VISTARIL) tablet 25 mg  25 mg Oral BID PRN Carlyn Reichert, MD       loperamide (IMODIUM) capsule 2 mg  2 mg Oral TID PRN  Carlyn Reichert, MD   2 mg at 11/29/20 0530   magnesium hydroxide (MILK OF MAGNESIA) suspension 30 mL  30 mL Oral Daily PRN Antonieta Pert, MD       nicotine (NICODERM CQ - dosed in mg/24 hours) patch 14 mg  14 mg Transdermal Daily Antonieta Pert, MD   14 mg at 12/05/20 0944   oxyCODONE-acetaminophen (PERCOCET/ROXICET) 5-325 MG per tablet 1 tablet  1 tablet Oral Q8H PRN Comer Locket, MD       [START ON 12/07/2020] Semaglutide(0.25 or 0.5MG /DOS) Namon Cirri 0.5 mg  0.5 mg Injection Weekly Carlyn Reichert, MD       traZODone (DESYREL) tablet 50 mg  50 mg Oral QHS PRN Carlyn Reichert, MD       ziprasidone (GEODON) capsule 80 mg  80 mg Oral BID WC Carlyn Reichert, MD   80 mg at 12/05/20 4481   PTA Medications: Medications Prior to Admission  Medication Sig Dispense Refill Last Dose   clonazePAM (KLONOPIN) 0.5 MG tablet Take 1 tablet (0.5 mg total) by mouth 2 (two) times daily as needed (Anxiety). 10 tablet 0    dolutegravir (TIVICAY) 50 MG tablet Take 1 tablet by mouth daily. 30 tablet 5    emtricitabine-tenofovir AF (DESCOVY) 200-25 MG tablet Take 1 tablet by  mouth daily. 30 tablet 5    OZEMPIC, 0.25 OR 0.5 MG/DOSE, 2 MG/1.5ML SOPN Inject 0.5 mg as directed once a week.      sertraline (ZOLOFT) 100 MG tablet Take 100 mg by mouth daily.      ziprasidone (GEODON) 40 MG capsule Take 40 mg by mouth daily.       Patient Stressors: Financial difficulties Health problems Medication change or noncompliance  Patient Strengths: Ability for insight Average or above average intelligence Capable of independent living Motivation for treatment/growth Supportive family/friends Work skills  Treatment Modalities: Medication Management, Group therapy, Case management,  1 to 1 session with clinician, Psychoeducation, Recreational therapy.   Physician Treatment Plan for Primary Diagnosis: Schizoaffective disorder (HCC) Long Term Goal(s): Improvement in symptoms so as ready for discharge   Short Term  Goals: Ability to identify changes in lifestyle to reduce recurrence of condition will improve Ability to verbalize feelings will improve Ability to disclose and discuss suicidal ideas Ability to demonstrate self-control will improve Ability to identify and develop effective coping behaviors will improve Ability to maintain clinical measurements within normal limits will improve  Medication Management: Evaluate patient's response, side effects, and tolerance of medication regimen.  Therapeutic Interventions: 1 to 1 sessions, Unit Group sessions and Medication administration.  Evaluation of Outcomes: Progressing  Physician Treatment Plan for Secondary Diagnosis: Principal Problem:   Schizoaffective disorder (HCC) Active Problems:   HIV INFECTION   PTSD (post-traumatic stress disorder)  Long Term Goal(s): Improvement in symptoms so as ready for discharge   Short Term Goals: Ability to identify changes in lifestyle to reduce recurrence of condition will improve Ability to verbalize feelings will improve Ability to disclose and discuss suicidal ideas Ability to demonstrate self-control will improve Ability to identify and develop effective coping behaviors will improve Ability to maintain clinical measurements within normal limits will improve     Medication Management: Evaluate patient's response, side effects, and tolerance of medication regimen.  Therapeutic Interventions: 1 to 1 sessions, Unit Group sessions and Medication administration.  Evaluation of Outcomes: Progressing   RN Treatment Plan for Primary Diagnosis: Schizoaffective disorder (HCC) Long Term Goal(s): Knowledge of disease and therapeutic regimen to maintain health will improve  Short Term Goals: Ability to remain free from injury will improve, Ability to verbalize frustration and anger appropriately will improve, Ability to demonstrate self-control, Ability to identify and develop effective coping behaviors will  improve, and Compliance with prescribed medications will improve  Medication Management: RN will administer medications as ordered by provider, will assess and evaluate patient's response and provide education to patient for prescribed medication. RN will report any adverse and/or side effects to prescribing provider.  Therapeutic Interventions: 1 on 1 counseling sessions, Psychoeducation, Medication administration, Evaluate responses to treatment, Monitor vital signs and CBGs as ordered, Perform/monitor CIWA, COWS, AIMS and Fall Risk screenings as ordered, Perform wound care treatments as ordered.  Evaluation of Outcomes: Progressing   LCSW Treatment Plan for Primary Diagnosis: Schizoaffective disorder (HCC) Long Term Goal(s): Safe transition to appropriate next level of care at discharge, Engage patient in therapeutic group addressing interpersonal concerns.  Short Term Goals: Engage patient in aftercare planning with referrals and resources, Increase social support, Increase ability to appropriately verbalize feelings, Increase emotional regulation, and Facilitate acceptance of mental health diagnosis and concerns  Therapeutic Interventions: Assess for all discharge needs, 1 to 1 time with Social worker, Explore available resources and support systems, Assess for adequacy in community support network, Educate family and significant other(s) on  suicide prevention, Complete Psychosocial Assessment, Interpersonal group therapy.  Evaluation of Outcomes: Progressing   Progress in Treatment: Attending groups: Yes. Participating in groups: Yes. Taking medication as prescribed: Yes. Toleration medication: Yes. Family/Significant other contact made: Yes, individual(s) contacted:  Case Manager  Patient understands diagnosis: Yes. Discussing patient identified problems/goals with staff: Yes. Medical problems stabilized or resolved: Yes. Denies suicidal/homicidal ideation: Yes. Issues/concerns  per patient self-inventory: No.   New problem(s) identified: No, Describe:  none  New Short Term/Long Term Goal(s): medication stabilization, elimination of SI thoughts, development of comprehensive mental wellness plan.    Patient Goals:  Did not attend  Discharge Plan or Barriers: Patient recently admitted. CSW will continue to follow and assess for appropriate referrals and possible discharge planning.     Reason for Continuation of Hospitalization: Depression Hallucinations Medication stabilization Suicidal ideation  Estimated Length of Stay: 3-5 days  Attendees: Patient: Did not attend 12/05/2020   Physician: Rhea Belton, DO 12/05/2020   Nursing:  12/05/2020   RN Care Manager: 12/05/2020   Social Worker: Melba Coon, LCSWA 12/05/2020   Recreational Therapist:  12/05/2020   Other:  12/05/2020   Other:  12/05/2020   Other: 12/05/2020     Scribe for Treatment Team: Felizardo Hoffmann, LCSWA 12/05/2020 10:04 AM

## 2020-12-05 NOTE — Progress Notes (Signed)
Recreation Therapy Notes  Date:  7.25.22 Time: 0930 Location: [address] Dayroom  Group Topic: Stress Management  Goal Area(s) Addresses:  Patient will identify positive stress management techniques. Patient will identify benefits of using stress management post d/c.  Intervention: Stress Management  Activity : Meditation.  LRT played a meditation that focused on being indecisive.  Meditation talked about being okay with the decisions you make even if they turn out to be the wrong decision, there is still room to learn from them.   Education:  Stress Management, Discharge Planning.   Education Outcome: Acknowledges Education  Clinical Observations/Feedback: Pt did not attend group session.    Caroll Rancher, LRT/CTRS         Caroll Rancher A 12/05/2020 11:58 AM

## 2020-12-05 NOTE — Plan of Care (Signed)
  Problem: Coping: Goal: Ability to identify and develop effective coping behavior will improve Outcome: Progressing   Problem: Self-Concept: Goal: Level of anxiety will decrease Outcome: Progressing   Problem: Coping: Goal: Will verbalize feelings Outcome: Progressing

## 2020-12-05 NOTE — Progress Notes (Addendum)
[facility] MD Progress Note  12/05/2020 2:53 PM Alan Rangel  MRN:  [id number] Subjective:   Alan Rangel is a 53 YOM with a PMHx of HIV and DM and a PPHx of MDD with psychotic features, schizoaffective disorder (bipolar vs depressed type) presenting voluntarily for command AH and SI with a plan.   24 hr events: no documented behavioral issues, no PRN medications given for agitation.   On interview this morning patient appears similar to previous days. He reports another episode of VH. He says that he blinked then opened his eyes while in the dayroom and everybody's heads were on backwards. When asked if that seemed real at the time, patient answers yes. However, he appears to be able to reality test to some degree because he calmly left the room and went elsewhere.  We had a candid discussion with the patient about the fact that it is unlikely his AVH will be eliminated completely and that he will have to reality test going forward. Patient was amenable to this. With respect to disposition, patient reports that he has been living in a hotel for the past two years. When provided with a description of a group home, patient seems to desire this, however, he reports an income that is unlikely to allow it.   Total Time Spent in Direct Patient Care: I personally spent 30 minutes on the unit in direct patient care. The direct patient care time included face-to-face time with the patient, reviewing the patient's chart, communicating with other professionals, and coordinating care. Greater than 50% of this time was spent in counseling or coordinating care with the patient regarding goals of hospitalization, psycho-education, and discharge planning needs.  Principal Problem: Schizoaffective disorder (HCC) Diagnosis: Principal Problem:   Schizoaffective disorder (HCC) Active Problems:   HIV INFECTION   PTSD (post-traumatic stress disorder)    Past Psychiatric History: as above  Past Medical History:  Past  Medical History:  Diagnosis Date   Depression    Episodic mood disorder (HCC)    Generalized anxiety disorder    HIV (human immunodeficiency virus infection) (HCC)    HTN (hypertension)    Panic attack    Paranoid schizophrenia (HCC)    Personality disorder (HCC)    Schizoaffective disorder, bipolar type (HCC)     Past Surgical History:  Procedure Laterality Date   SKIN GRAFT Right    foot/leg   Family History:  Family History  Problem Relation Age of Onset   Sinusitis Mother    Mental illness Father    Hypertension Maternal Grandmother    Diabetes Maternal Grandmother    Family Psychiatric  History: negative Social History:  Social History   Substance and Sexual Activity  Alcohol Use No   Alcohol/week: 0.0 standard drinks   Comment: History of, but not currently     Social History   Substance and Sexual Activity  Drug Use No    Social History   Socioeconomic History   Marital status: Single    Spouse name: Not on file   Number of children: 0   Years of education: college   Highest education level: Not on file  Occupational History   Occupation: UNEMPLOYED    Employer: UNEMPLOYED   Occupation: Consulting civil engineer  Tobacco Use   Smoking status: Every Day    Packs/day: 0.50    Years: 10.00    Pack years: 5.00    Types: Cigarettes   Smokeless tobacco: Never   Tobacco comments:    would like patches  Vaping Use   Vaping Use: Never used  Substance and Sexual Activity   Alcohol use: No    Alcohol/week: 0.0 standard drinks    Comment: History of, but not currently   Drug use: No   Sexual activity: Never    Partners: Female, Male    Birth control/protection: Condom    Comment: pt given condoms 08/2020  Other Topics Concern   Not on file  Social History Narrative   Not on file   Social Determinants of Health   Financial Resource Strain: Not on file  Food Insecurity: Not on file  Transportation Needs: Not on file  Physical Activity: Not on file  Stress: Not  on file  Social Connections: Not on file   Additional Social History:   See H and P  Sleep: Good  Appetite:  Fair  Current Medications: Current Facility-Administered Medications  Medication Dose Route Frequency Provider Last Rate Last Admin   acetaminophen (TYLENOL) tablet 650 mg  650 mg Oral Q6H PRN Antonieta Pert, MD   650 mg at 11/28/20 2135   alum & mag hydroxide-simeth (MAALOX/MYLANTA) 200-200-20 MG/5ML suspension 30 mL  30 mL Oral Q4H PRN Antonieta Pert, MD   30 mL at 11/28/20 0302   atorvastatin (LIPITOR) tablet 10 mg  10 mg Oral Daily Antonieta Pert, MD   10 mg at 12/05/20 4235   benztropine (COGENTIN) tablet 0.5 mg  0.5 mg Oral BID Carlyn Reichert, MD   0.5 mg at 12/05/20 0941   clonazePAM (KLONOPIN) tablet 0.5 mg  0.5 mg Oral BID Antonieta Pert, MD   0.5 mg at 12/05/20 0941   dolutegravir (TIVICAY) tablet 50 mg  50 mg Oral Daily Antonieta Pert, MD   50 mg at 12/05/20 3614   emtricitabine-tenofovir AF (DESCOVY) 200-25 MG per tablet 1 tablet  1 tablet Oral Daily Antonieta Pert, MD   1 tablet at 12/05/20 0942   escitalopram (LEXAPRO) tablet 10 mg  10 mg Oral Daily Carlyn Reichert, MD   10 mg at 12/05/20 4315   gabapentin (NEURONTIN) capsule 200 mg  200 mg Oral TID Princess Bruins, DO   200 mg at 12/05/20 1135   hydrALAZINE (APRESOLINE) tablet 25 mg  25 mg Oral Q8H PRN Ronaldo Miyamoto, Tyrone A, DO       hydrochlorothiazide (MICROZIDE) capsule 12.5 mg  12.5 mg Oral Daily Candelaria Stagers T, MD   12.5 mg at 12/05/20 0942   hydrOXYzine (ATARAX/VISTARIL) tablet 25 mg  25 mg Oral QHS Carlyn Reichert, MD   25 mg at 12/04/20 2133   hydrOXYzine (ATARAX/VISTARIL) tablet 25 mg  25 mg Oral BID PRN Carlyn Reichert, MD       loperamide (IMODIUM) capsule 2 mg  2 mg Oral TID PRN Carlyn Reichert, MD   2 mg at 11/29/20 0530   magnesium hydroxide (MILK OF MAGNESIA) suspension 30 mL  30 mL Oral Daily PRN Antonieta Pert, MD       nicotine (NICODERM CQ - dosed in mg/24 hours) patch 14 mg  14  mg Transdermal Daily Antonieta Pert, MD   14 mg at 12/05/20 0944   oxyCODONE-acetaminophen (PERCOCET/ROXICET) 5-325 MG per tablet 1 tablet  1 tablet Oral Q8H PRN Comer Locket, MD       [START ON 12/07/2020] Semaglutide(0.25 or 0.5MG /DOS) Namon Cirri 0.5 mg  0.5 mg Injection Weekly Carlyn Reichert, MD       traZODone (DESYREL) tablet 50 mg  50 mg Oral QHS PRN Carlyn Reichert,  MD       ziprasidone (GEODON) capsule 80 mg  80 mg Oral BID WC Carlyn Reichert, MD   80 mg at 12/05/20 2130    Lab Results:  Results for orders placed or performed during the hospital encounter of 11/24/20 (from the past 48 hour(s))  Glucose, capillary     Status: None   Collection Time: 12/04/20  5:51 AM  Result Value Ref Range   Glucose-Capillary 98 70 - 99 mg/dL    Comment: Glucose reference range applies only to samples taken after fasting for at least 8 hours.   Comment 1 Notify RN   Glucose, capillary     Status: Abnormal   Collection Time: 12/04/20  8:52 PM  Result Value Ref Range   Glucose-Capillary 146 (H) 70 - 99 mg/dL    Comment: Glucose reference range applies only to samples taken after fasting for at least 8 hours.  Glucose, capillary     Status: None   Collection Time: 12/05/20  6:08 AM  Result Value Ref Range   Glucose-Capillary 76 70 - 99 mg/dL    Comment: Glucose reference range applies only to samples taken after fasting for at least 8 hours.   Comment 1 Notify RN    Comment 2 Document in Chart   Magnesium     Status: None   Collection Time: 12/05/20  6:32 AM  Result Value Ref Range   Magnesium 1.9 1.7 - 2.4 mg/dL    Comment: Performed at [facility], [address], [place], [place] [address]  Renal function panel     Status: Abnormal   Collection Time: 12/05/20  6:32 AM  Result Value Ref Range   Sodium 141 135 - 145 mmol/L   Potassium 4.1 3.5 - 5.1 mmol/L   Chloride 105 98 - 111 mmol/L   CO2 28 22 - 32 mmol/L   Glucose, Bld 96 70 - 99 mg/dL    Comment: Glucose  reference range applies only to samples taken after fasting for at least 8 hours.   BUN 10 6 - 20 mg/dL   Creatinine, Ser 4.69 (H) 0.61 - 1.24 mg/dL   Calcium 9.2 8.9 - 62.9 mg/dL   Phosphorus 3.4 2.5 - 4.6 mg/dL   Albumin 4.2 3.5 - 5.0 g/dL   GFR, Estimated >52 >84 mL/min    Comment: (NOTE) Calculated using the CKD-EPI Creatinine Equation (2021)    Anion gap 8 5 - 15    Comment: Performed at [facility], [address], [place], [place] [address]    Blood Alcohol level:  Lab Results  Component Value Date   ETH <10 11/23/2020   ETH <10 05/08/2017    Metabolic Disorder Labs: Lab Results  Component Value Date   HGBA1C 5.9 (H) 11/25/2020   MPG 122.63 11/25/2020   MPG 117 (H) 06/17/2013   No results found for: PROLACTIN Lab Results  Component Value Date   CHOL 153 11/25/2020   TRIG 99 11/25/2020   HDL 30 (L) 11/25/2020   CHOLHDL 5.1 11/25/2020   VLDL 20 11/25/2020   LDLCALC 103 (H) 11/25/2020   LDLCALC 97 04/13/2020    Physical Findings: AIMS: 0 for jaw movements (improved from previous days), moderate restlessness  Musculoskeletal: Strength & Muscle Tone: within normal limits Gait & Station: normal Patient leans: N/A  Psychiatric Specialty Exam:  Presentation  General Appearance: Casual , obese, adequate hygiene  Eye Contact:Good  Speech:Clear and Coherent  Speech Volume:Normal  Handedness:Right  Mood and Affect  Mood: "okay"  Affect: congruent  Thought Process  Thought Processes: Superficially goal directed but concrete  Orientation:Full (Time, Place and Person)  Thought Content: reports VH of people's heads being on backwards, denies AH, ideas of reference or first rank symptoms, denies paranoia  History of Schizophrenia/Schizoaffective disorder:Yes  Duration of Psychotic Symptoms:Greater than six months  Hallucinations: VH as described above  Ideas of Reference:None  Suicidal Thoughts: denies  Homicidal Thoughts:  denies  Sensorium  Memory:Immediate Fair; Recent Fair; Remote Fair  Judgment:Fair  Insight:Fair   Executive Functions  Concentration:Fair  Attention Span:Fair  Recall:Fair  Fund of Knowledge:Fair  Language:Fair   Psychomotor Activity  Psychomotor Activity: Normal - steady gait   Assets  Assets:Housing , resilience    Sleep  Sleep:Sleep: Fair Number of Hours of Sleep: 6.75   Physical Exam Vitals reviewed.  Constitutional:      Appearance: He is obese.  HENT:     Head: Normocephalic and atraumatic.  Pulmonary:     Effort: Pulmonary effort is normal.  Neurological:     General: No focal deficit present.     Mental Status: He is alert and oriented to person, place, and time.    Review of Systems  Respiratory:  Negative for shortness of breath.   Cardiovascular:  Negative for chest pain.  Gastrointestinal:  Negative for nausea and vomiting.  Neurological:  Negative for headaches.  Blood pressure (!) 143/91, pulse 66, temperature 98.2 F (36.8 C), temperature source Oral, resp. rate 20, height 6\' 1"  (1.854 m), weight (!) 149.7 kg, SpO2 96 %. Body mass index is 43.54 kg/m.   Safety and Monitoring -- VOLUNTARY admission to inpatient psychiatric unit for safety, stabilization and treatment -- Daily contact with patient to assess and evaluate symptoms and progress in treatment -- Patient's case to be discussed in multi-disciplinary team meeting -- Observation Level : q15 minute checks -- Vital signs:  q12 hours -- Precautions: suicide  Psychosis, PPHx of Schizoaffective d/o and MDD w psychotic features AP labs: A1C 5.9, lipids with LDL 103 HDL 30 (on atorvastatin), EKG w sinus brady, Qtc of 400 -Continue Geodon 80 mg bid (started 7/22) attempting to assess patient's functional baseline via contact with case manager for collateral  EKG (7/17): NSR, Qtc 431 ; Repeat: NSR with QTC 427 ; Repeat 7/24: NSR with Qtc 439  CBC ordered for 7/26 to check  ANC -Continue cogentin 0.5 mg bid for jaw movements (Pt subjectively and objectively improved) -Continue Vistaril 25mg  qhs scheduled for help with insomnia  Anxiety and depression -Klonopin 0.5 mg bid for anxiety and panic symptoms (patient takes PRN as OP) -Paxil 5 mg (started 7/14), D/C'd 7/18 due to GI side effects  -Imodium tid PRN for diarrhea -Continue Lexapro 10 mg (7/21) for MDD  Restless Leg Syndrome and neuropathic pain Patient complaining of frequent urge to move his legs at night, improved Now complaining of neuropathic pain -Continue gabapentin 200 mg TID   Tobacco use disorder -Nicoderm patch 14 mg  Continue PRN's: Tylenol, Maalox, Atarax, Milk of Magnesia, Trazodone (decrease to 25 mg given incontinence)  Medical management  HIV: continue home meds: Tivicay 50 mg PO daily, Descovy 200-25 mg PO daily -Last VL undetectable, last CD4 1,700 (08/2020) -Recheck on 7/16: VL (<20) and CD4 (1724) -Monitor for signs of infection  R leg swelling DVT study negative, BNP wnl, symptoms improved Amlodipine D/C'd and replaced with 12.5 mg HCTZ, monitor K with regular BMPs (7/22 K of 4.1) (7/25 K  of 4.2), per medicine check again in 1 wk Patient reports improvement in symptoms  Type 2 DM  continue semaglutide injection weekly (last dose 7/20), well controlled with A1C of 5.9 (technically does not meet criteria for DM) -Monitor with daily AM POC glucose  Admission labs:  BMP w Cr of 1.32 on admission, 1.24 (7/17), 1.27 (7/22), 1.30 (7/25)  CBC wnl  UDS with benzos (prescribed) and THC   Dispo: TBD  Treatment Plan Summary: Daily contact with patient to assess and evaluate symptoms and progress in treatment and Medication management   Carlyn ReichertNick Gabrielle PGY-1, Psychiatry

## 2020-12-05 NOTE — BHH Group Notes (Signed)
Type of Therapy and Topic: Group Therapy: Anger Management   Participation Level: Active   Description of Group: In this group, patients will learn helpful strategies and techniques to manage anger, express anger in alternative ways, change hostile attitudes, and prevent aggressive acts, such as verbal abuse and violence.This group will be process-oriented and eductional, with patients participating in exploration of their own experiences as well as giving and receiving support and challenge from other group members.  Therapeutic Goals: Patient will learn to manage anger. Patient will learn to stop violence or the threat of violence. Patient will learn to develop self control over thoughts and actions. Patient will receive support and feedback from others  Summary of Patient Progress: Alan Rangel shared that he uses writing in a journal to avoid becoming angry.  He accepted the worksheets and participated in the group discussion with his peers.    Therapeutic Modalities: Cognitive Behavioral Therapy Solution Focused Therapy Motivational Interviewing

## 2020-12-05 NOTE — Progress Notes (Signed)
Chart reviewed.  BP slightly elevated this morning but one isolated value.  Has been normotensive over the last few days.  Renal panel and magnesium stable.  Discussed with primary team.  Recommend repeat renal panel in 1 week. TRH signed off but available if needed.

## 2020-12-05 NOTE — Progress Notes (Signed)
      12/04/20 2133  Psych Admission Type (Psych Patients Only)  Admission Status Voluntary  Psychosocial Assessment  Patient Complaints Depression  Eye Contact Fair  Facial Expression Anxious;Animated  Affect Appropriate to circumstance  Speech Logical/coherent  Interaction Assertive  Motor Activity Slow  Appearance/Hygiene Unremarkable  Behavior Characteristics Cooperative;Calm  Mood Pleasant  Thought Process  Coherency Concrete thinking  Content WDL  Delusions None reported or observed  Perception Hallucinations  Hallucination Auditory;Command (saing just die Fayrene Fearing)  Judgment Impaired  Confusion None  Danger to Self  Current suicidal ideation? Denies  Danger to Others  Danger to Others None reported or observed

## 2020-12-06 DIAGNOSIS — F259 Schizoaffective disorder, unspecified: Principal | ICD-10-CM

## 2020-12-06 LAB — GLUCOSE, CAPILLARY: Glucose-Capillary: 90 mg/dL (ref 70–99)

## 2020-12-06 LAB — CBC WITH DIFFERENTIAL/PLATELET
Abs Immature Granulocytes: 0.02 10*3/uL (ref 0.00–0.07)
Basophils Absolute: 0 10*3/uL (ref 0.0–0.1)
Basophils Relative: 0 %
Eosinophils Absolute: 0.1 10*3/uL (ref 0.0–0.5)
Eosinophils Relative: 1 %
HCT: 48.1 % (ref 39.0–52.0)
Hemoglobin: 16 g/dL (ref 13.0–17.0)
Immature Granulocytes: 0 %
Lymphocytes Relative: 50 %
Lymphs Abs: 4 10*3/uL (ref 0.7–4.0)
MCH: 31.9 pg (ref 26.0–34.0)
MCHC: 33.3 g/dL (ref 30.0–36.0)
MCV: 96 fL (ref 80.0–100.0)
Monocytes Absolute: 0.5 10*3/uL (ref 0.1–1.0)
Monocytes Relative: 6 %
Neutro Abs: 3.5 10*3/uL (ref 1.7–7.7)
Neutrophils Relative %: 43 %
Platelets: 263 10*3/uL (ref 150–400)
RBC: 5.01 MIL/uL (ref 4.22–5.81)
RDW: 12.9 % (ref 11.5–15.5)
WBC: 8.2 10*3/uL (ref 4.0–10.5)
nRBC: 0 % (ref 0.0–0.2)

## 2020-12-06 MED ORDER — ESCITALOPRAM OXALATE 10 MG PO TABS: 10.0000 mg | ORAL_TABLET | Freq: Every day | ORAL | 0 refills | Status: AC

## 2020-12-06 MED ORDER — NICOTINE 14 MG/24HR TD PT24: 14.0000 mg | MEDICATED_PATCH | Freq: Every day | TRANSDERMAL | 0 refills | Status: DC

## 2020-12-06 MED ORDER — HYDROXYZINE HCL 25 MG PO TABS: 25.0000 mg | ORAL_TABLET | Freq: Every day | ORAL | 0 refills | Status: DC

## 2020-12-06 MED ORDER — HYDROCHLOROTHIAZIDE 12.5 MG PO CAPS: 12.5000 mg | ORAL_CAPSULE | Freq: Every day | ORAL | 0 refills | Status: DC

## 2020-12-06 MED ORDER — ZIPRASIDONE HCL 80 MG PO CAPS: 80.0000 mg | ORAL_CAPSULE | Freq: Two times a day (BID) | ORAL | 0 refills | Status: DC

## 2020-12-06 MED ORDER — GABAPENTIN 100 MG PO CAPS: 200.0000 mg | ORAL_CAPSULE | Freq: Three times a day (TID) | ORAL | 0 refills | Status: DC

## 2020-12-06 MED ORDER — NICOTINE 14 MG/24HR TD PT24: 14.0000 mg | MEDICATED_PATCH | Freq: Every day | TRANSDERMAL | 0 refills | Status: AC

## 2020-12-06 MED ORDER — BENZTROPINE MESYLATE 0.5 MG PO TABS: 0.5000 mg | ORAL_TABLET | Freq: Two times a day (BID) | ORAL | 0 refills | Status: DC

## 2020-12-06 MED ORDER — ATORVASTATIN CALCIUM 10 MG PO TABS: 10.0000 mg | ORAL_TABLET | Freq: Every day | ORAL | 0 refills | Status: DC

## 2020-12-06 MED ORDER — ZIPRASIDONE HCL 80 MG PO CAPS: 80.0000 mg | ORAL_CAPSULE | Freq: Two times a day (BID) | ORAL | 0 refills | Status: AC

## 2020-12-06 MED ORDER — ESCITALOPRAM OXALATE 10 MG PO TABS: 10.0000 mg | ORAL_TABLET | Freq: Every day | ORAL | 0 refills | Status: DC

## 2020-12-06 MED ORDER — CLONAZEPAM 0.5 MG PO TABS: 0.5000 mg | ORAL_TABLET | Freq: Two times a day (BID) | ORAL | 0 refills | Status: AC | PRN

## 2020-12-06 NOTE — Progress Notes (Signed)
Discharge Note:  Patient discharged via Safety Transport to shelter.  Suicide prevention information given and discussed with patient who stated he understood and had no questions.  Patient denied SI and HI.  Denied A/V hallucinations.  Patient stated he received all his belongings, clothing, toiletries, misc items, etc.  Patient stated he appreciated all assistance received from [facility] staff.

## 2020-12-06 NOTE — Care Management Important Message (Signed)
Medicare IM given to social work to give to the patient 

## 2020-12-06 NOTE — BHH Suicide Risk Assessment (Signed)
[facility] Discharge Suicide Risk Assessment   Principal Problem: Schizoaffective disorder [facility]) Discharge Diagnoses: Principal Problem:   Schizoaffective disorder (HCC) Active Problems:   HIV INFECTION   PTSD (post-traumatic stress disorder)   Total Time spent with patient: 20 minutes  Musculoskeletal: Strength & Muscle Tone: within normal limits Gait & Station: normal Patient leans: N/A  Psychiatric Specialty Exam  Presentation  General Appearance: Casual  Eye Contact:Good  Speech:Clear and Coherent  Speech Volume:Normal  Handedness:Right   Mood and Affect  Mood:Euthymic  Duration of Depression Symptoms: Less than two weeks  Affect:Depressed   Thought Process  Thought Processes:Coherent  Descriptions of Associations:Intact  Orientation:Full (Time, Place and Person)  Thought Content:Logical  History of Schizophrenia/Schizoaffective disorder:Yes  Duration of Psychotic Symptoms:Greater than six months  Hallucinations:No data recorded Ideas of Reference:None  Suicidal Thoughts:No data recorded Homicidal Thoughts:No data recorded  Sensorium  Memory:Immediate Fair; Recent Fair; Remote Fair  Judgment:Poor  Insight:Poor   Executive Functions  Concentration:Fair  Attention Span:Fair  Recall:Fair  Fund of Knowledge:Fair  Language:Fair   Psychomotor Activity  Psychomotor Activity: No data recorded  Assets  Assets:Housing   Sleep  Sleep: No data recorded  Physical Exam: Physical Exam Vitals and nursing note reviewed.  Constitutional:      Appearance: Normal appearance.  HENT:     Head: Normocephalic and atraumatic.  Pulmonary:     Effort: Pulmonary effort is normal.  Neurological:     General: No focal deficit present.     Mental Status: He is alert and oriented to person, place, and time.   Review of Systems  Psychiatric/Behavioral:  Positive for hallucinations.   All other systems reviewed and are negative. Blood pressure  139/86, pulse 81, temperature 97.9 F (36.6 C), temperature source Oral, resp. rate 18, height 6\' 1"  (1.854 m), weight (!) 149.7 kg, SpO2 99 %. Body mass index is 43.54 kg/m.  Mental Status Per Nursing Assessment::   On Admission:  Suicidal ideation indicated by patient, Plan includes specific time, place, or method, Intention to act on suicide plan, Suicidal ideation indicated by others, Belief that plan would result in death, Suicide plan  Demographic Factors:  Male, [id number], lesbian, or bisexual orientation, Low socioeconomic status, Living alone, and Unemployed  Loss Factors: Financial problems/change in socioeconomic status  Historical Factors: Impulsivity  Risk Reduction Factors:   NA  Continued Clinical Symptoms:  Depression:   Impulsivity Schizophrenia:   Depressive state Previous Psychiatric Diagnoses and Treatments Medical Diagnoses and Treatments/Surgeries  Cognitive Features That Contribute To Risk:  None    Suicide Risk:  Minimal: No identifiable suicidal ideation.  Patients presenting with no risk factors but with morbid ruminations; may be classified as minimal risk based on the severity of the depressive symptoms   Follow-up Information     [facility] Follow up.   Why: Please go online to [email or web address] to apply for services from this provider for therapy. Contact information: [address], [place], [place]  Phone: [phone]        Center, [facility]. Go on 12/27/2020.   Why: You have an appointment with 12/29/2020 for medication management services on 12/27/20 at 11:30 am.  This appointment will be held in person. Contact information: [address] [place] [phone]         [facility] Follow up on 01/10/2021.   Specialty: Behavioral Health Why: You have an appointment on  01/10/21 at  11:00 am with [facility]  [facility].  This appointment will be Virtual via  Mychart. Contact information: [address] [place] [address] [phone]                Plan Of Care/Follow-up recommendations:  Activity:  ad lib  Antonieta Pert, MD 12/06/2020, 9:45 AM

## 2020-12-06 NOTE — BHH Group Notes (Signed)
Adult Psychoeducational Group Note  Date:  12/06/2020 Time:  5:15 AM  Group Topic/Focus:  Personal Choices and Values:   The focus of this group is to help patients assess and explore the importance of values in their lives, how their values affect their decisions, how they express their values and what opposes their expression.  Participation Level:  Active  Participation Quality:  Attentive  Affect:  Appropriate  Cognitive:  Alert  Insight: Appropriate  Engagement in Group:  Engaged  Modes of Intervention:  Discussion  Additional Comments Jacalyn Lefevre 12/06/2020, 5:15 AM

## 2020-12-06 NOTE — Progress Notes (Addendum)
   D:  Pt reports moderate anxiety and high depression.  Pt reports having time to reflect today. Pt currently denies SI/HI/AVH.  A:  Labs/Vitals monitored; Education was provided.  Pt encouraged to communicate concerns.  R:  Pt remains safe on unit with Q15 minute safety checks.  Will continue POC    12/05/20 2117  Psych Admission Type (Psych Patients Only)  Admission Status Voluntary  Psychosocial Assessment  Patient Complaints Anxiety;Depression  Eye Contact Fair  Facial Expression Anxious;Animated  Affect Appropriate to circumstance  Speech Logical/coherent  Interaction Assertive  Motor Activity Slow  Appearance/Hygiene Unremarkable  Behavior Characteristics Cooperative;Calm  Mood Pleasant  Thought Process  Coherency Concrete thinking  Content WDL  Delusions None reported or observed  Perception Hallucinations  Hallucination None reported or observed  Judgment Impaired  Confusion None  Danger to Self  Current suicidal ideation? Denies  Danger to Others  Danger to Others None reported or observed

## 2020-12-06 NOTE — Plan of Care (Signed)
Care Plan completed. Patient to be discharged Per Treatment  team and Provider.

## 2020-12-06 NOTE — Plan of Care (Signed)
  Problem: Self-Concept: Goal: Level of anxiety will decrease Outcome: Progressing   Problem: Coping: Goal: Coping ability will improve Outcome: Progressing Goal: Will verbalize feelings Outcome: Progressing   

## 2020-12-06 NOTE — Progress Notes (Signed)
Adult Psychoeducational Group Note  Date:  12/06/2020 Time:  9:41 AM  Group Topic/Focus:  Goals Group:   The focus of this group is to help patients establish daily goals to achieve during treatment and discuss how the patient can incorporate goal setting into their daily lives to aide in recovery.  Participation Level:  Active  Participation Quality:  Appropriate  Affect:  Appropriate  Cognitive:  Appropriate  Insight: Appropriate  Engagement in Group:  Engaged  Modes of Intervention:  Discussion  Additional Comments: The patient expressed that his goal is to prepare for discharge.  Octavio Manns 12/06/2020, 9:41 AM

## 2020-12-06 NOTE — Progress Notes (Signed)
  [facility] Adult Case Management Discharge Plan :  Will you be returning to the same living situation after discharge:  No. Will be discharged to shelter At discharge, do you have transportation home?: No. Safe Transport will be arranged Do you have the ability to pay for your medications: Yes,  has insurance  Release of information consent forms completed and in the chart;  Patient's signature needed at discharge.  Patient to Follow up at:  Follow-up Information     [facility] Follow up.   Why: Please go online to [email or web address] to apply for services from this provider for therapy. Contact information: [address] [place], [place], [place] [address]  Phone: [phone]        Center, [facility]. Go on 12/27/2020.   Why: You have an appointment with Gerrit Heck for medication management services on 12/27/20 at 11:30 am.  This appointment will be held in person. Contact information: [address] [place] [address] [phone]         [facility] Follow up on 01/10/2021.   Specialty: Behavioral Health Why: You have an appointment on  01/10/21 at  11:00 am with [facility]idge Breeze.  This appointment will be Virtual via Mychart. Contact information: [address] [place] [address] [phone]                Next level of care provider has access to [facility] Link:no  Safety Planning and Suicide Prevention discussed: Yes,  with case manager  Have you used any form of tobacco in the last 30 days? (Cigarettes, Smokeless Tobacco, Cigars, and/or Pipes): Yes  Has patient been referred to the Quitline?: Patient refused referral  Patient has been referred for addiction treatment: Pt. refused referral  Otelia Santee, LCSW 12/06/2020, 10:00 AM
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
## 2020-12-27 ENCOUNTER — Telehealth: Payer: Self-pay

## 2020-12-27 ENCOUNTER — Ambulatory Visit: Payer: Medicare HMO | Admitting: Family

## 2020-12-27 NOTE — Telephone Encounter (Signed)
Attempted to call patient in regards No Show to appointment today. Unable to leave a VM due to VM box is full.    Ellwood Steidle Lesli Albee, CMA

## 2021-01-10 ENCOUNTER — Ambulatory Visit (HOSPITAL_COMMUNITY): Payer: Medicare HMO | Admitting: Licensed Clinical Social Worker

## 2021-01-10 NOTE — Progress Notes (Signed)
Therapist contacted patient through My Chart and he did not respond. Session is a no show 

## 2021-01-13 ENCOUNTER — Telehealth: Payer: Self-pay

## 2021-01-13 NOTE — Telephone Encounter (Signed)
Left patient a Voice mail to call back to schedule Lab and office visit after a request was made

## 2021-02-08 ENCOUNTER — Encounter (HOSPITAL_COMMUNITY): Payer: Self-pay | Admitting: *Deleted

## 2021-02-08 ENCOUNTER — Emergency Department (HOSPITAL_COMMUNITY)
Admission: EM | Admit: 2021-02-08 | Discharge: 2021-02-09 | Disposition: A | Discharge status: Home / Self Care | Payer: Medicare HMO | Attending: Emergency Medicine | Admitting: Emergency Medicine

## 2021-02-08 ENCOUNTER — Emergency Department (HOSPITAL_COMMUNITY): Payer: Medicare HMO

## 2021-02-08 ENCOUNTER — Other Ambulatory Visit: Payer: Self-pay

## 2021-02-08 DIAGNOSIS — Z79899 Other long term (current) drug therapy: Secondary | ICD-10-CM | POA: Diagnosis not present

## 2021-02-08 DIAGNOSIS — M5442 Lumbago with sciatica, left side: Secondary | ICD-10-CM | POA: Insufficient documentation

## 2021-02-08 DIAGNOSIS — Z20822 Contact with and (suspected) exposure to covid-19: Secondary | ICD-10-CM | POA: Insufficient documentation

## 2021-02-08 DIAGNOSIS — M545 Low back pain, unspecified: Secondary | ICD-10-CM | POA: Diagnosis present

## 2021-02-08 DIAGNOSIS — Z794 Long term (current) use of insulin: Secondary | ICD-10-CM | POA: Insufficient documentation

## 2021-02-08 DIAGNOSIS — M62838 Other muscle spasm: Secondary | ICD-10-CM

## 2021-02-08 DIAGNOSIS — I10 Essential (primary) hypertension: Secondary | ICD-10-CM | POA: Diagnosis not present

## 2021-02-08 DIAGNOSIS — R109 Unspecified abdominal pain: Secondary | ICD-10-CM | POA: Diagnosis not present

## 2021-02-08 DIAGNOSIS — F17210 Nicotine dependence, cigarettes, uncomplicated: Secondary | ICD-10-CM | POA: Insufficient documentation

## 2021-02-08 DIAGNOSIS — R3981 Functional urinary incontinence: Secondary | ICD-10-CM | POA: Diagnosis not present

## 2021-02-08 DIAGNOSIS — K5792 Diverticulitis of intestine, part unspecified, without perforation or abscess without bleeding: Secondary | ICD-10-CM

## 2021-02-08 DIAGNOSIS — R0789 Other chest pain: Secondary | ICD-10-CM | POA: Diagnosis not present

## 2021-02-08 DIAGNOSIS — E119 Type 2 diabetes mellitus without complications: Secondary | ICD-10-CM | POA: Diagnosis not present

## 2021-02-08 DIAGNOSIS — Z21 Asymptomatic human immunodeficiency virus [HIV] infection status: Secondary | ICD-10-CM | POA: Diagnosis not present

## 2021-02-08 HISTORY — DX: Type 2 diabetes mellitus without complications: E11.9

## 2021-02-08 LAB — CBC WITH DIFFERENTIAL/PLATELET
Abs Immature Granulocytes: 0.01 10*3/uL (ref 0.00–0.07)
Basophils Absolute: 0 10*3/uL (ref 0.0–0.1)
Basophils Relative: 0 %
Eosinophils Absolute: 0.2 10*3/uL (ref 0.0–0.5)
Eosinophils Relative: 1 %
HCT: 46.5 % (ref 39.0–52.0)
Hemoglobin: 15.6 g/dL (ref 13.0–17.0)
Immature Granulocytes: 0 %
Lymphocytes Relative: 39 %
Lymphs Abs: 4.4 10*3/uL — ABNORMAL HIGH (ref 0.7–4.0)
MCH: 31.3 pg (ref 26.0–34.0)
MCHC: 33.5 g/dL (ref 30.0–36.0)
MCV: 93.4 fL (ref 80.0–100.0)
Monocytes Absolute: 0.7 10*3/uL (ref 0.1–1.0)
Monocytes Relative: 6 %
Neutro Abs: 6 10*3/uL (ref 1.7–7.7)
Neutrophils Relative %: 54 %
Platelets: 221 10*3/uL (ref 150–400)
RBC: 4.98 MIL/uL (ref 4.22–5.81)
RDW: 13.3 % (ref 11.5–15.5)
WBC: 11.3 10*3/uL — ABNORMAL HIGH (ref 4.0–10.5)
nRBC: 0 % (ref 0.0–0.2)

## 2021-02-08 LAB — URINALYSIS, ROUTINE W REFLEX MICROSCOPIC
Bacteria, UA: NONE SEEN
Bilirubin Urine: NEGATIVE
Glucose, UA: NEGATIVE mg/dL
Hgb urine dipstick: NEGATIVE
Ketones, ur: NEGATIVE mg/dL
Leukocytes,Ua: NEGATIVE
Nitrite: NEGATIVE
Protein, ur: NEGATIVE mg/dL
Specific Gravity, Urine: <1.005 — ABNORMAL LOW (ref 1.005–1.030)
pH: 6 (ref 5.0–8.0)

## 2021-02-08 LAB — RESP PANEL BY RT-PCR (FLU A&B, COVID) ARPGX2
Influenza A by PCR: NEGATIVE
Influenza B by PCR: NEGATIVE
SARS Coronavirus 2 by RT PCR: NEGATIVE

## 2021-02-08 IMAGING — MR MR LUMBAR SPINE WO/W CM
6 of 7 series · 31 of 48 positions shown · IV contrast (gadavist)
Comparison: None.

CLINICAL DATA: Low back pain and incontinence

EXAM:
MRI LUMBAR SPINE WITHOUT AND WITH CONTRAST
TECHNIQUE: Multiplanar and multiecho pulse sequences of the lumbar spine were
obtained without and with intravenous contrast.
CONTRAST:  10mL GADAVIST GADOBUTROL 1 MMOL/ML IV SOLN

[Series 9: T1 · sagittal · 4.0mm · 0.81mm/px · 5 of 17 slices shown (1 of 2)]
[im 1/17]
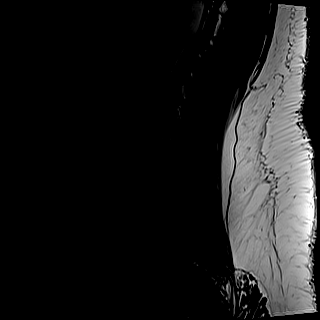
[im 5/17]
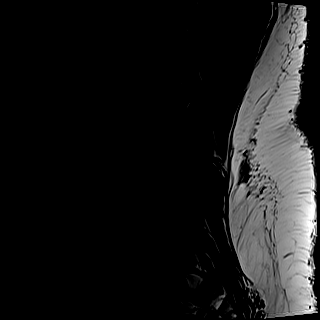
[im 9/17]
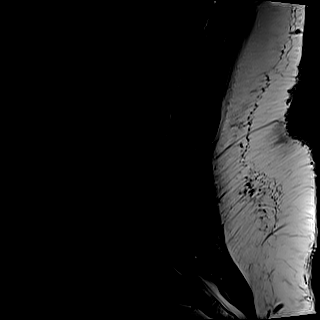
[im 13/17]
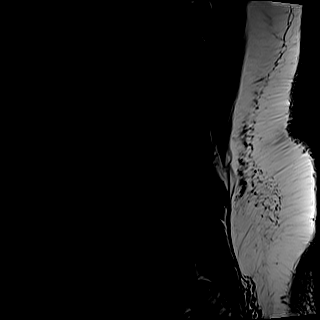
[im 17/17]
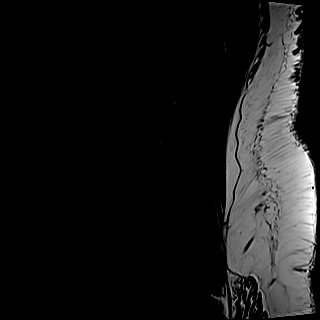

[Series 10: STIR · sagittal · 4.0mm · 0.51mm/px · 2 of 17 slices shown]
[im 1/17]
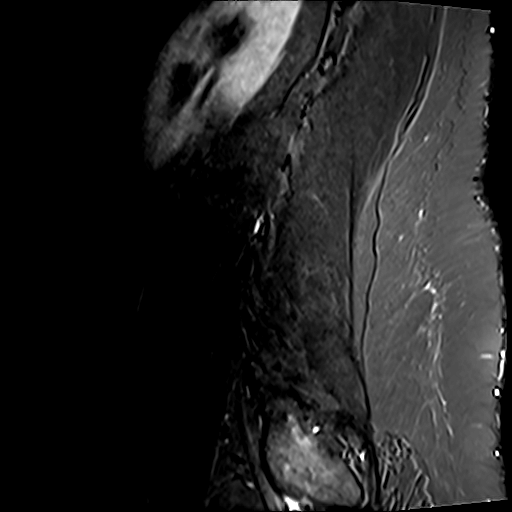
[im 5/17]
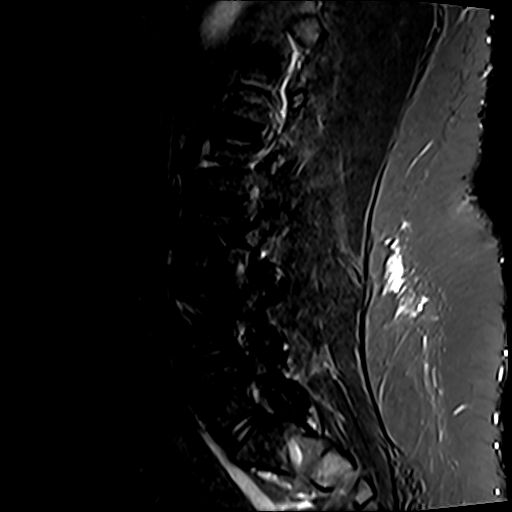

[Series 11: T2 · sagittal · 4.0mm · 0.81mm/px · 4 of 17 slices shown (1 of 2)]
[im 1/17]
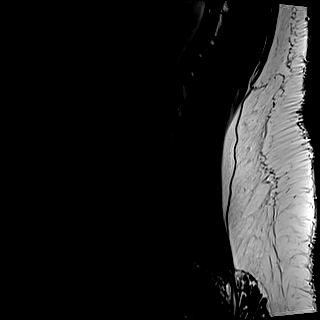
[im 6/17]
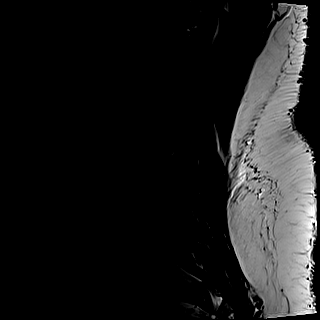
[im 11/17]
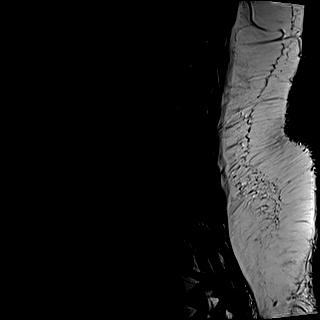
[im 17/17]
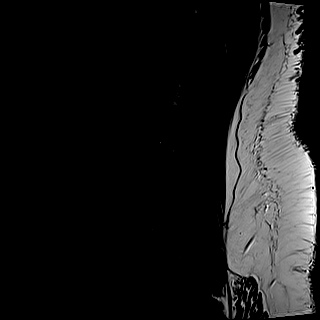

[Series 12: T2 · axial · 4.0mm · 0.62mm/px · z∈[-136,+98]mm · 8 of 43 slices shown (2 of 2)]
[im 1/43]
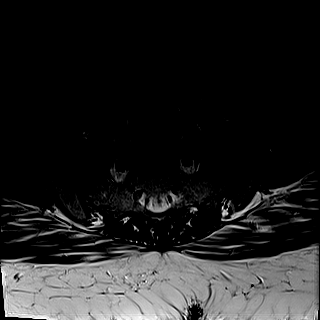
[im 5/43]
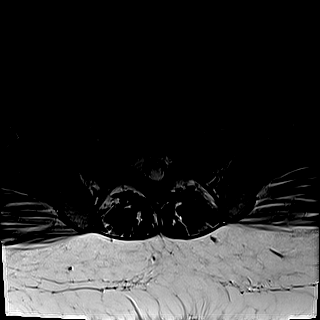
[im 15/43]
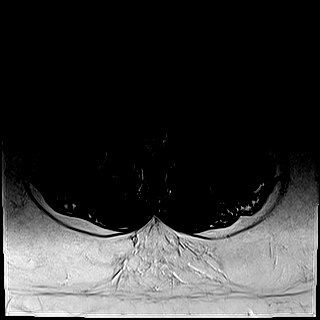
[im 19/43]
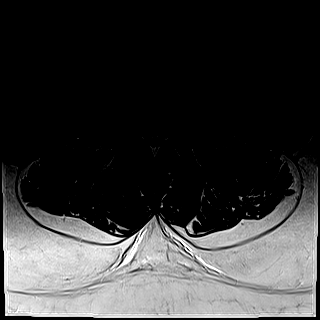
[im 24/43]
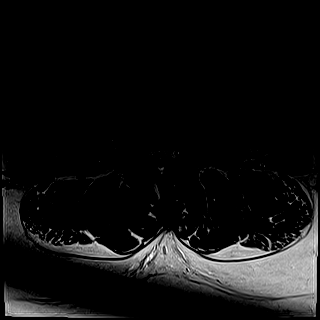
[im 29/43]
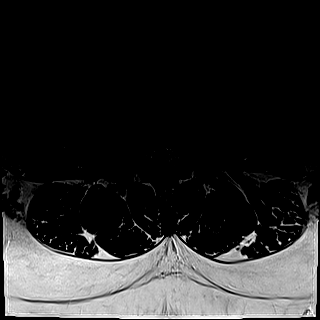
[im 38/43]
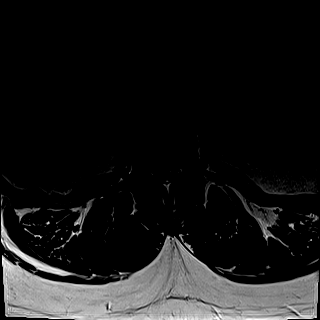
[im 43/43]
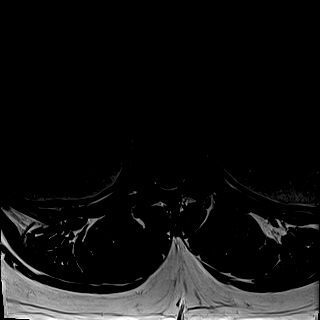

[Series 13: T1 · axial · 4.0mm · 0.39mm/px · z∈[-136,+98]mm · 8 of 43 slices shown (2 of 2)]
[im 1/43]
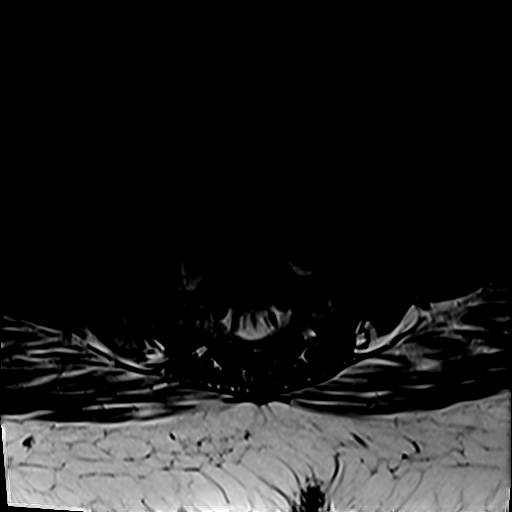
[im 5/43]
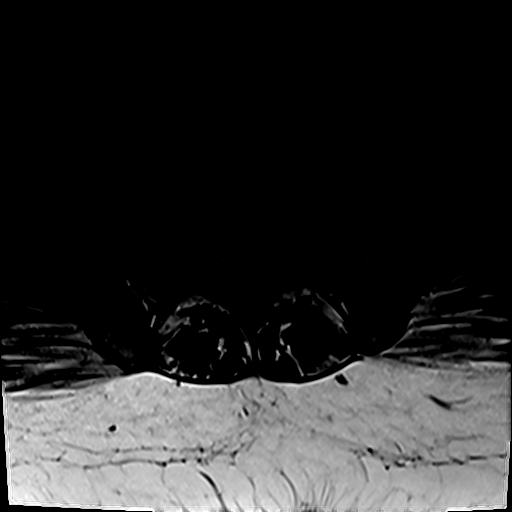
[im 15/43]
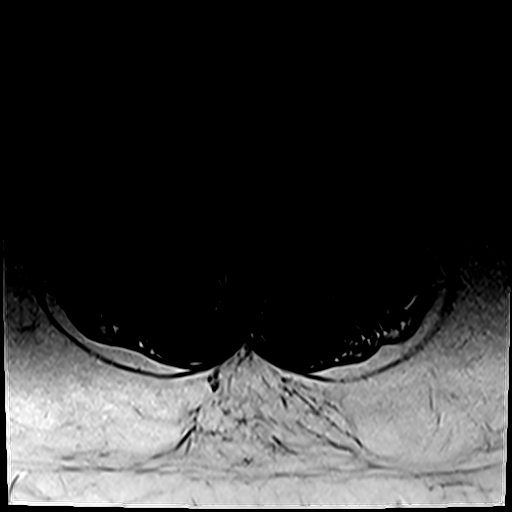
[im 19/43]
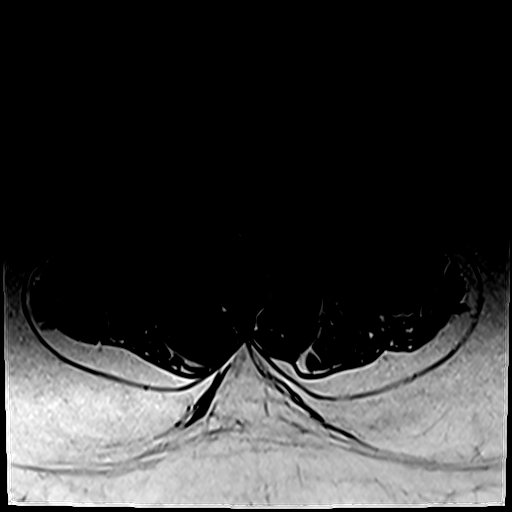
[im 24/43]
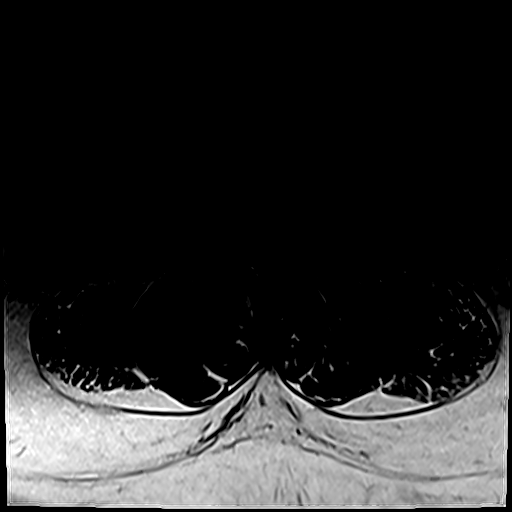
[im 29/43]
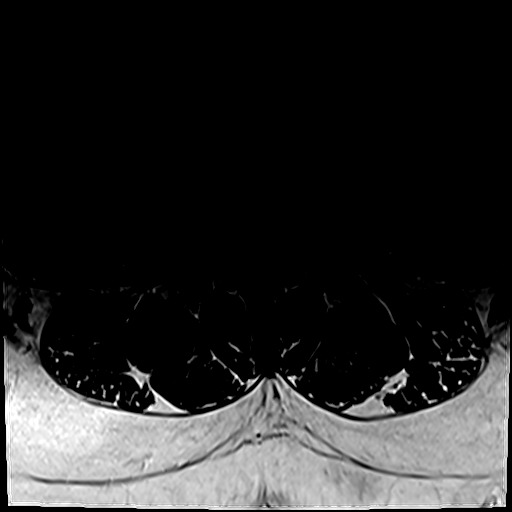
[im 38/43]
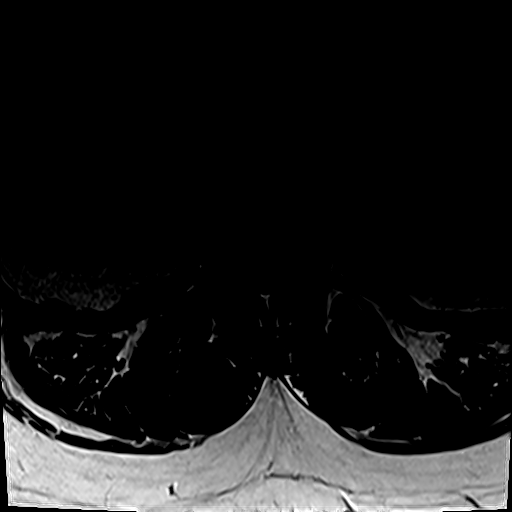
[im 43/43]
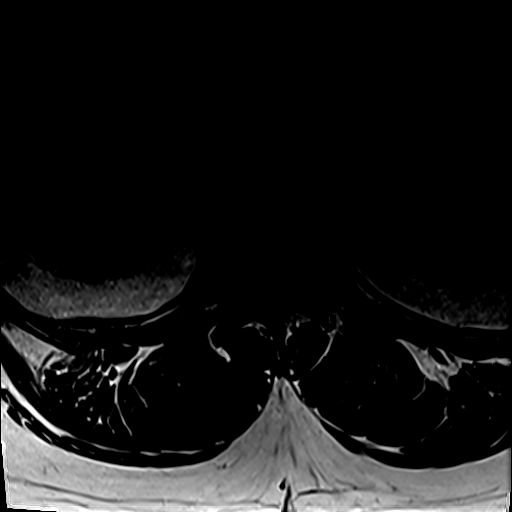

[Series 14: T1 fat-sat post-contrast · sagittal · 4.0mm · 0.81mm/px · 4 of 17 slices shown]
[im 1/17]
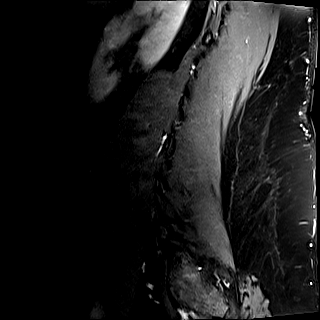
[im 6/17]
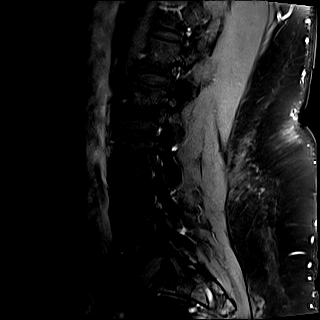
[im 11/17]
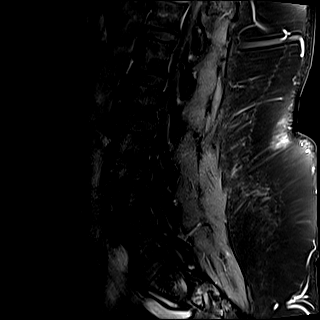
[im 17/17]
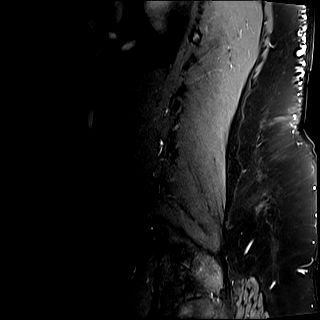

[31 of 48 positions shown; findings below may reference images not displayed]

FINDINGS: Segmentation:  Standard.

Alignment:  Physiologic.

Vertebrae:  No fracture, evidence of discitis, or bone lesion.

Conus medullaris and cauda equina: Conus extends to the L1 level.
Conus and cauda equina appear normal.

Paraspinal and other soft tissues: Negative.

Disc levels:

L1-L2: Normal disc space and facet joints. No spinal canal stenosis.
No neural foraminal stenosis.

L2-L3: Normal disc space and facet joints. No spinal canal stenosis.
No neural foraminal stenosis.

L3-L4: Normal disc space and facet joints. No spinal canal stenosis.
No neural foraminal stenosis.

L4-L5: Small disc bulge with moderate facet hypertrophy. No spinal
canal stenosis. No neural foraminal stenosis.

L5-S1: Normal disc space and facet joints. No spinal canal stenosis.
No neural foraminal stenosis.

Visualized sacrum: Normal.
IMPRESSION: Mild lower lumbar degenerative disc disease without spinal canal or
neural foraminal stenosis.

## 2021-02-08 MED ORDER — HYDROMORPHONE HCL 1 MG/ML IJ SOLN
1.0000 mg | Freq: Once | INTRAMUSCULAR | Status: AC
  Administered 2021-02-08: 1 mg via INTRAVENOUS
  Filled 2021-02-08: qty 1

## 2021-02-08 MED ORDER — GADOBUTROL 1 MMOL/ML IV SOLN
10.0000 mL | Freq: Once | INTRAVENOUS | Status: AC | PRN
  Administered 2021-02-08: 10 mL via INTRAVENOUS

## 2021-02-08 NOTE — ED Notes (Signed)
Patient transported to MRI 

## 2021-02-08 NOTE — ED Triage Notes (Signed)
Pt states that he woke up during the night with a "burning, nerve pain" that starts in the mid lower portion of his back. The "pain travels up my back and hurts in my chest, both shoulders and my jaw". Pt states that the pain comes and goes and describes pain as spasms. Pt states that he has been incont of bowel and bladder with this pain and it causes him to "clench fists" involuntarily.

## 2021-02-08 NOTE — ED Provider Notes (Signed)
11:45 PM Assumed care from Dr. Rubin Payor, please see their note for full history, physical and decision making until this point. In brief this is a 45 y.o. year old male who presented to the ED tonight with Back Pain (Radiates to stomach, chest, and jaw. )     Back pain. Pending MRI for discharge.   MRI without any acute changes.  Dissection study done and negative.  This was performed secondary to the pain in multiple areas up and down next to his spine.  But did find to have diverticulitis which I will treat although he does not seem to be symptomatic.  Symptoms did seem to have significant provement with Flexeril.  I will see any other indication for further work-up or management in the emergency room at this time.  Follow-up in 1 day.  Will return here if any changes before then.  Discharge instructions, including strict return precautions for new or worsening symptoms, given. Patient and/or family verbalized understanding and agreement with the plan as described.   Labs, studies and imaging reviewed by myself and considered in medical decision making if ordered. Imaging interpreted by radiology.  Labs Reviewed  URINALYSIS, ROUTINE W REFLEX MICROSCOPIC - Abnormal; Notable for the following components:      Result Value   Color, Urine YELLOW (*)    APPearance CLEAR (*)    Specific Gravity, Urine <1.005 (*)    All other components within normal limits  RESP PANEL BY RT-PCR (FLU A&B, COVID) [id number]  COMPREHENSIVE METABOLIC PANEL  CBC WITH DIFFERENTIAL/PLATELET    MR Lumbar Spine W Wo Contrast    (Results Pending)  DG Chest Portable 1 View    (Results Pending)    No follow-ups on file.    Tamari Busic, Barbara Cower, MD 02/09/21 [phone]
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
## 2021-02-08 NOTE — ED Triage Notes (Signed)
Per EMS, pt with RUE/RLE stiffness. Back pain mid lower that radiates to stomach, chest and jaw. Had episode of n/v/d x1 day. All new onset.

## 2021-02-08 NOTE — ED Notes (Signed)
Unable to obtain blood specimen before pt transported to MRI

## 2021-02-09 ENCOUNTER — Emergency Department (HOSPITAL_COMMUNITY): Payer: Medicare HMO

## 2021-02-09 ENCOUNTER — Encounter (HOSPITAL_COMMUNITY): Payer: Self-pay

## 2021-02-09 LAB — COMPREHENSIVE METABOLIC PANEL
ALT: 18 U/L (ref 0–44)
AST: 15 U/L (ref 15–41)
Albumin: 4.3 g/dL (ref 3.5–5.0)
Alkaline Phosphatase: 125 U/L (ref 38–126)
Anion gap: 10 (ref 5–15)
BUN: 8 mg/dL (ref 6–20)
CO2: 26 mmol/L (ref 22–32)
Calcium: 9 mg/dL (ref 8.9–10.3)
Chloride: 103 mmol/L (ref 98–111)
Creatinine, Ser: 1.03 mg/dL (ref 0.61–1.24)
GFR, Estimated: >60 mL/min (ref >60)
Glucose, Bld: 102 mg/dL — ABNORMAL HIGH (ref 70–99)
Potassium: 3.6 mmol/L (ref 3.5–5.1)
Sodium: 139 mmol/L (ref 135–145)
Total Bilirubin: 0.5 mg/dL (ref 0.3–1.2)
Total Protein: 7.2 g/dL (ref 6.5–8.1)

## 2021-02-09 LAB — LIPASE, BLOOD: Lipase: 28 U/L (ref 11–51)

## 2021-02-09 IMAGING — DX DG CHEST 1V PORT
1 series · 1 of 1 positions shown · non-contrast
Comparison: None.

CLINICAL DATA: Chest pain

EXAM:
PORTABLE CHEST 1 VIEW

[chest ap]
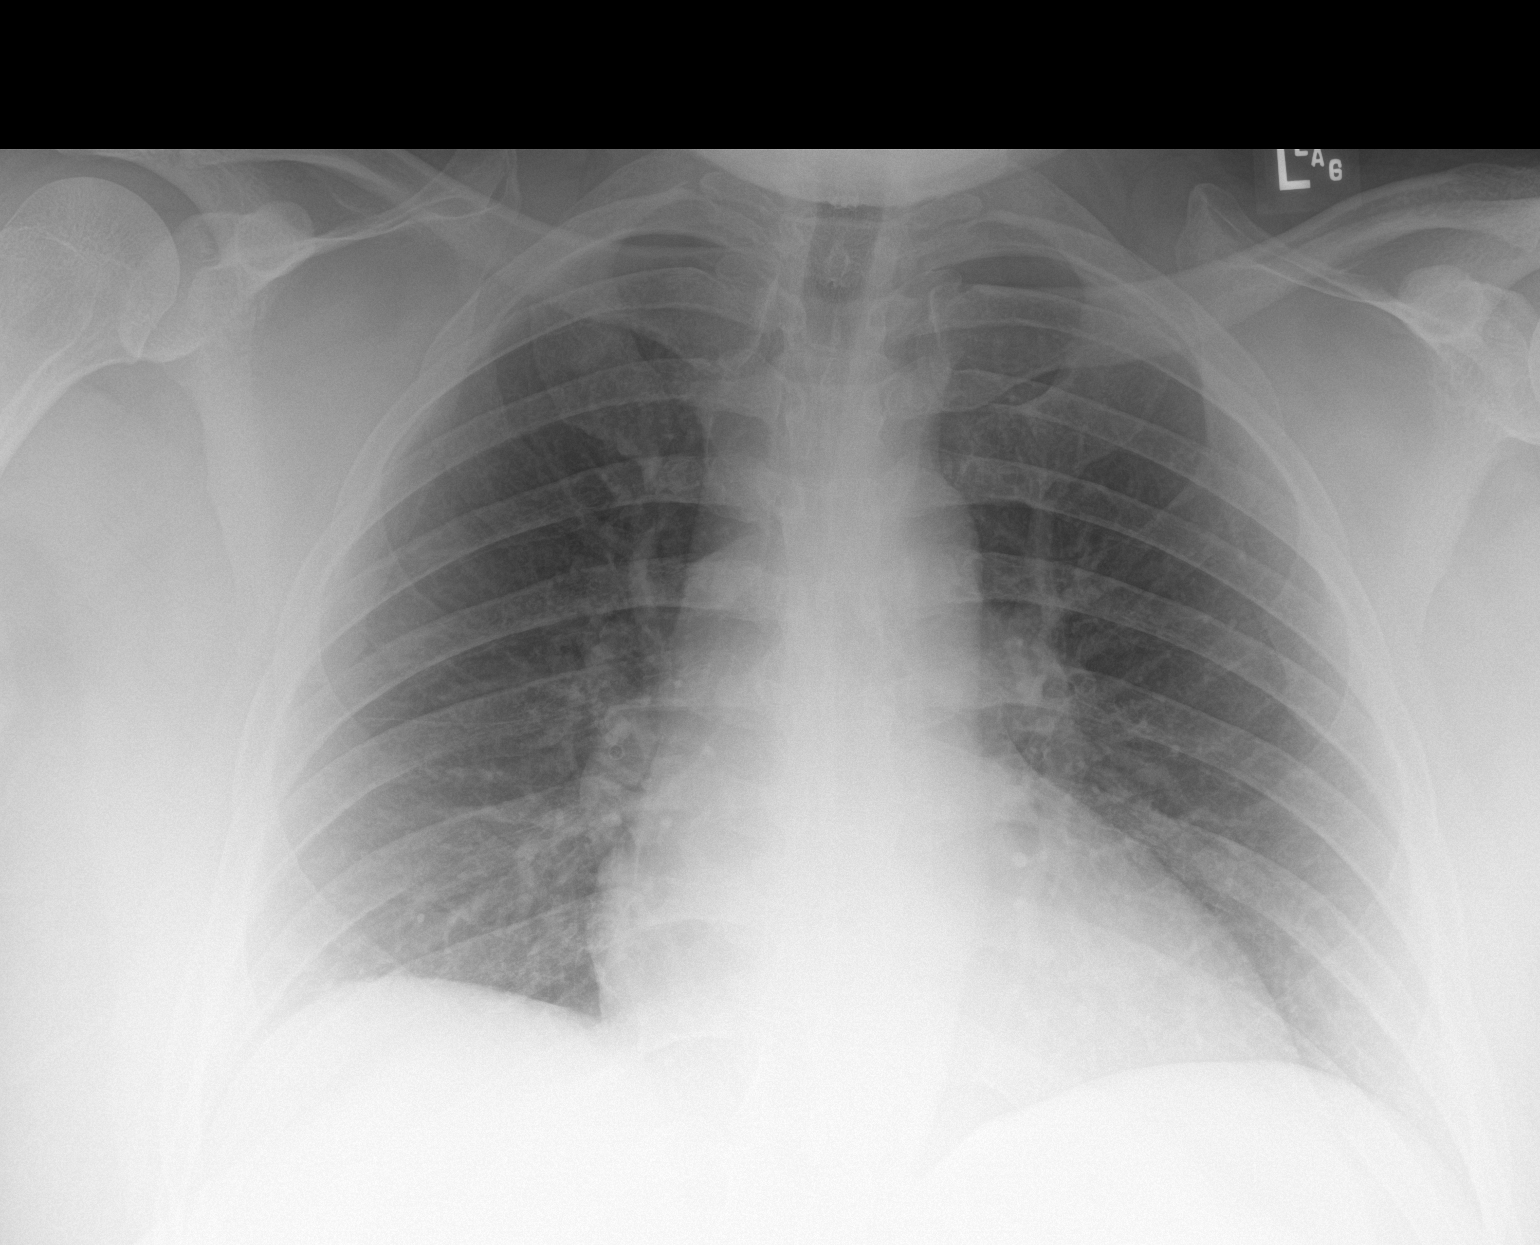

[1 of 1 positions shown; findings below may reference images not displayed]

FINDINGS: The heart size and mediastinal contours are within normal limits.
Both lungs are clear. The visualized skeletal structures are
unremarkable.
IMPRESSION: No active disease.

## 2021-02-09 IMAGING — CT CT ANGIO CHEST-ABD-PELV FOR DISSECTION W/ AND WO/W CM
2 of 8 series · 14 of 46 positions shown, 16 images · IV contrast (OMNIPAQUE 350)
Comparison: CT chest [DATE]

CLINICAL DATA: Abdominal pain, aortic dissection suspected.
Unspecified abdominal pain, back pain.

EXAM:
CT ANGIOGRAPHY CHEST, ABDOMEN AND PELVIS
TECHNIQUE: Non-contrast CT of the chest was initially obtained.

[Series 6: axial arterial · axial · arterial · 0.69mm/px · z∈[+1174,+1738]mm · 11 of 224 slices shown, 13 images]
[im 18/224  soft-tissue]
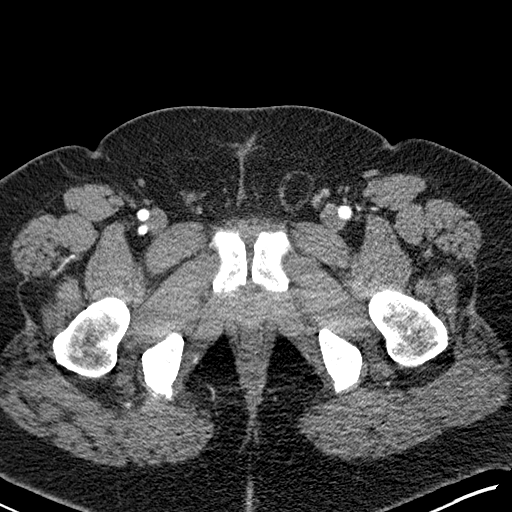
[im 18/224  bone]
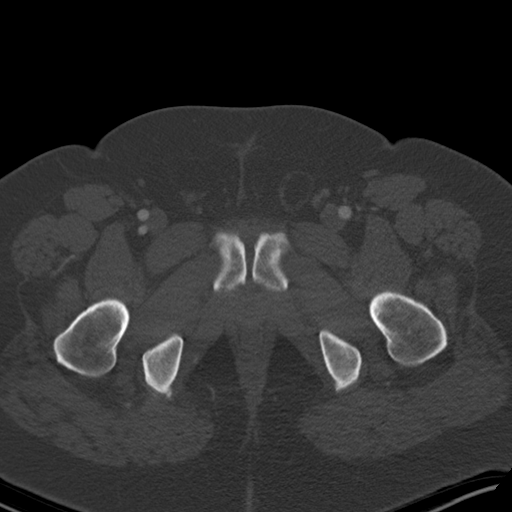
[im 35/224  soft-tissue]
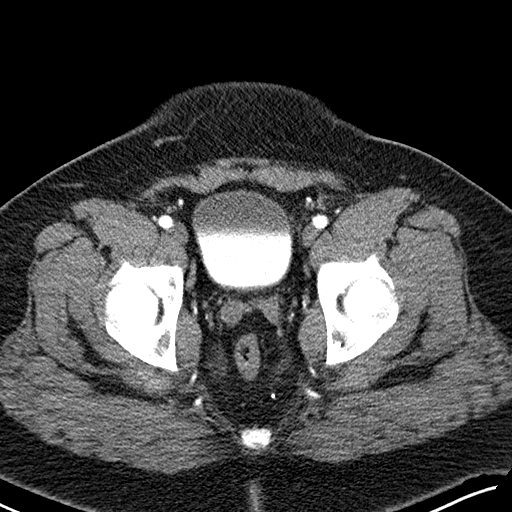
[im 52/224  soft-tissue]
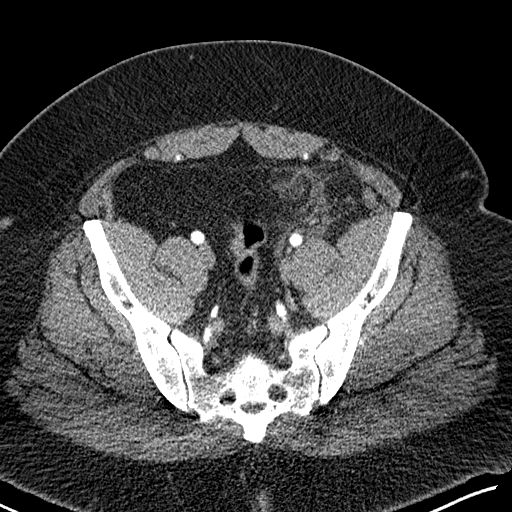
[im 69/224  soft-tissue]
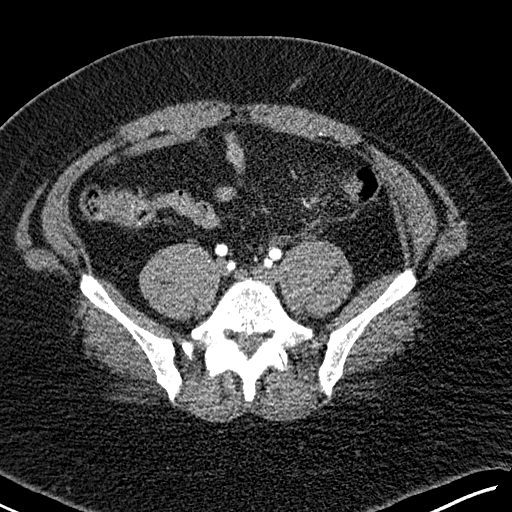
[im 86/224  soft-tissue]
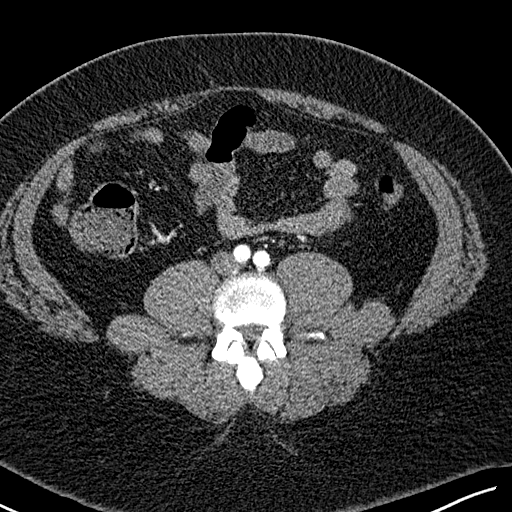
[im 121/224  soft-tissue]
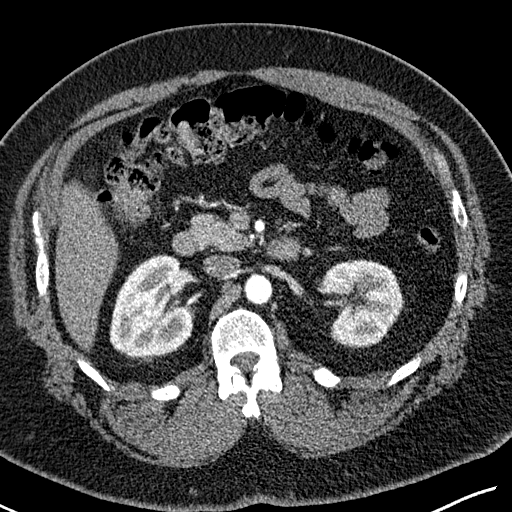
[im 138/224  soft-tissue]
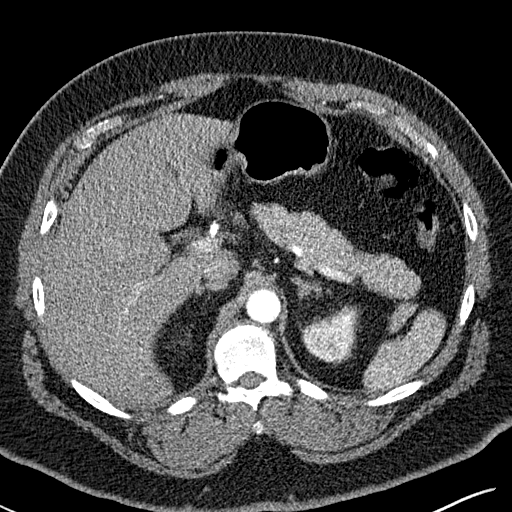
[im 155/224  soft-tissue]
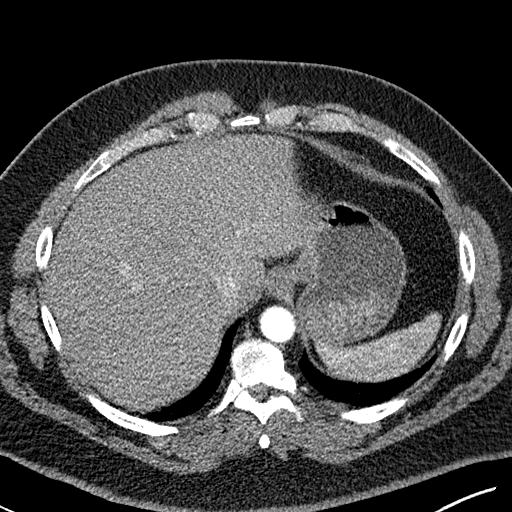
[im 172/224  soft-tissue]
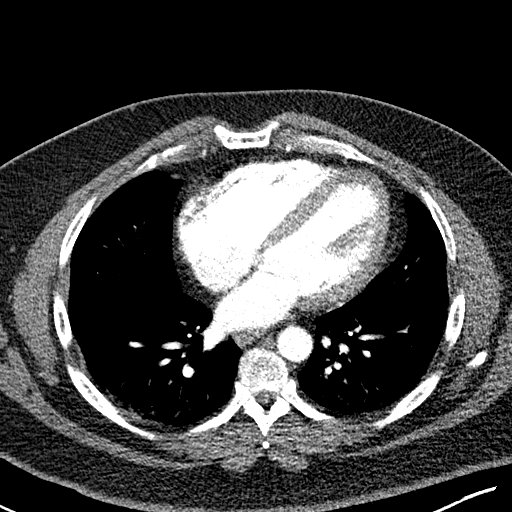
[im 172/224  bone]
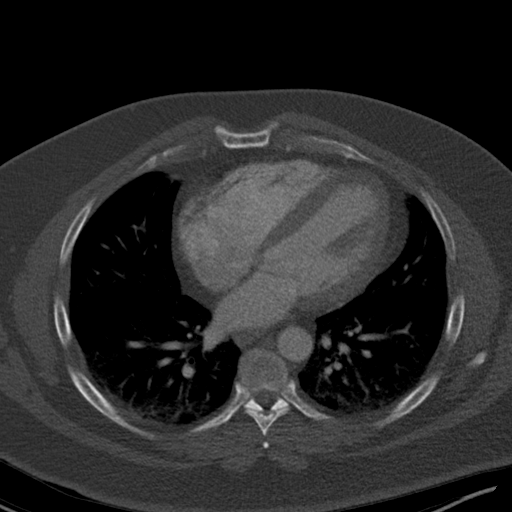
[im 189/224  soft-tissue]
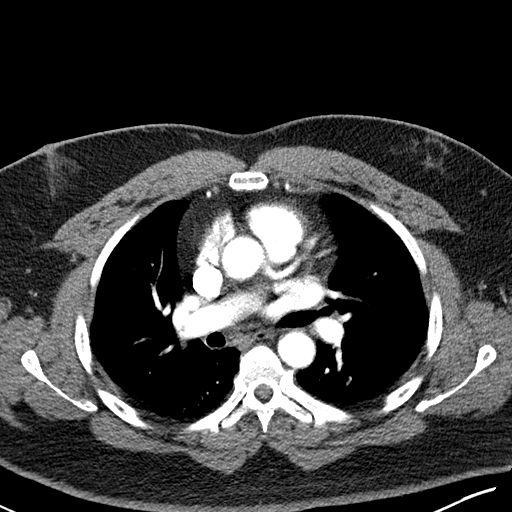
[im 206/224  soft-tissue]
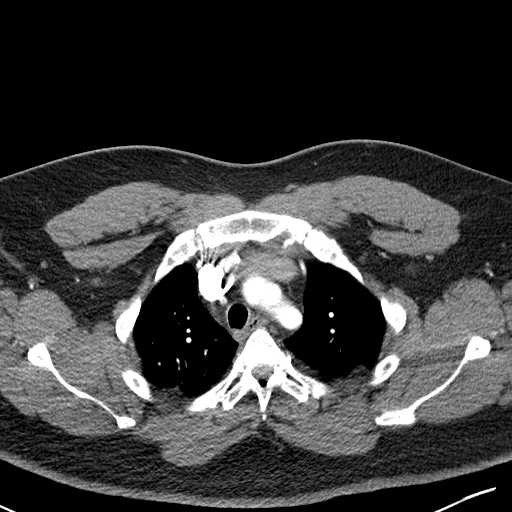

[Series 9: coronals · coronal · 0.81mm/px · 3 of 166 slices shown]
[im 42/166  soft-tissue]
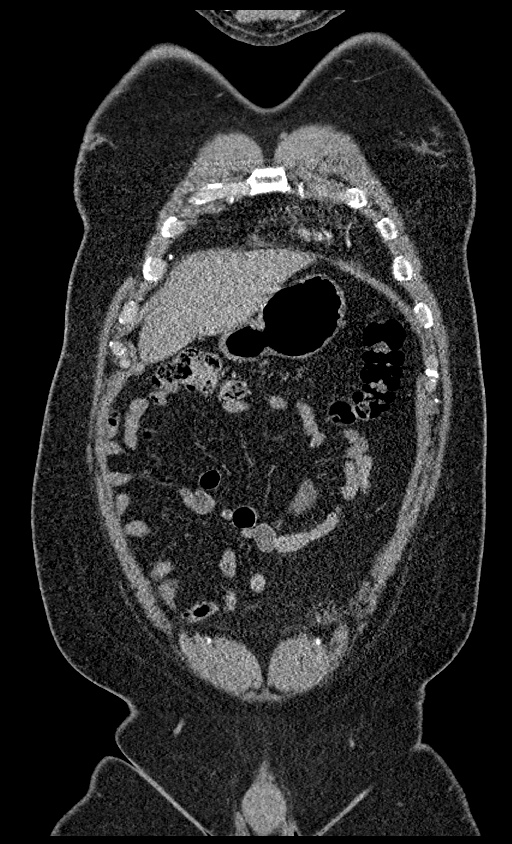
[im 83/166  soft-tissue]
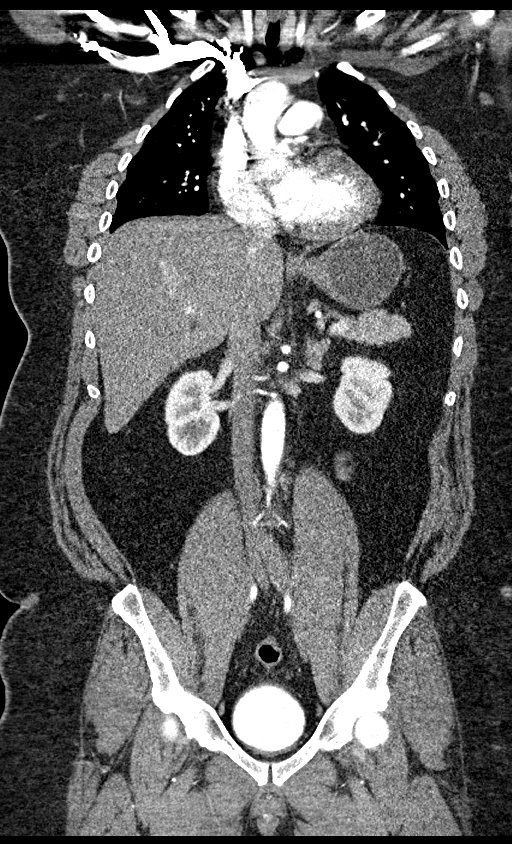
[im 124/166  soft-tissue]
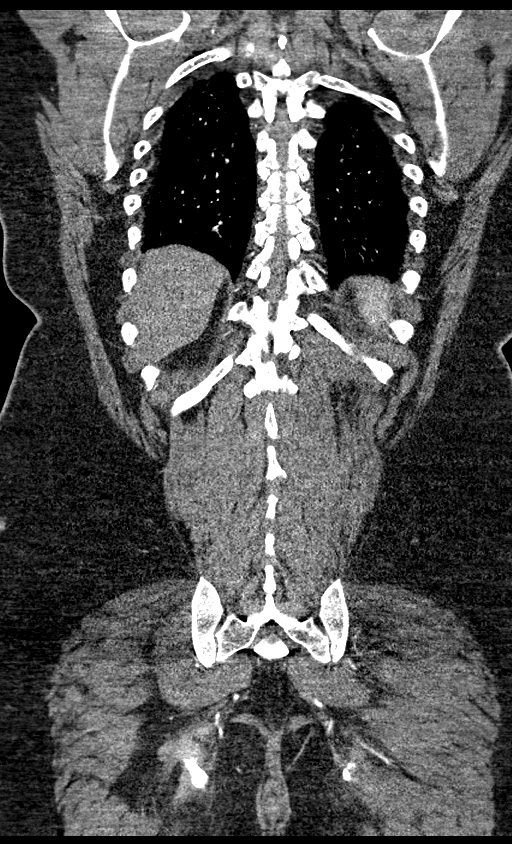

[14 of 46 positions shown; findings below may reference images not displayed]

Multidetector CT imaging through the chest, abdomen and pelvis was
performed using the standard protocol during bolus administration of
intravenous contrast. Multiplanar reconstructed images and MIPs were
obtained and reviewed to evaluate the vascular anatomy.

CONTRAST:  100mL OMNIPAQUE IOHEXOL 350 MG/ML SOLN
FINDINGS: CTA CHEST FINDINGS

Cardiovascular: Preferential opacification of the thoracic aorta. No
evidence of thoracic aortic aneurysm or dissection. Normal heart
size. No pericardial effusion.

Mediastinum/Nodes: No enlarged mediastinal, hilar, or axillary lymph
nodes. Thyroid gland, trachea, and esophagus demonstrate no
significant findings.

Lungs/Pleura: Lungs are clear. No pleural effusion or pneumothorax.

Musculoskeletal: No chest wall abnormality. No acute or significant
osseous findings.

Review of the MIP images confirms the above findings.

CTA ABDOMEN AND PELVIS FINDINGS

VASCULAR

Aorta: Normal caliber aorta without aneurysm, dissection, vasculitis
or significant stenosis.

Celiac: Unremarkable

SMA: Unremarkable

Renals: Dual right and single left renal arteries are present and
are widely patent. Normal vascular morphology. No aneurysm.

IMA: Unremarkable

Inflow: Unremarkable

Veins: No obvious venous abnormality within the limitations of this
arterial phase study.

Review of the MIP images confirms the above findings.

NON-VASCULAR

Hepatobiliary: At least mild hepatic steatosis. Gallbladder
unremarkable. No intra or extrahepatic biliary ductal dilation.

Pancreas: Unremarkable

Spleen: Unremarkable

Adrenals/Urinary Tract: Adrenal glands are unremarkable. Kidneys are
normal, without renal calculi, focal lesion, or hydronephrosis.
Bladder is unremarkable.

Stomach/Bowel: There is circumferential bowel wall thickening and
pericolonic inflammatory stranding involving the mid sigmoid colon
in keeping with changes of acute, mild, uncomplicated sigmoid
diverticulitis. There is no evidence of obstruction. No free
intraperitoneal gas. No free intraperitoneal fluid or loculated
intra-abdominal fluid collections. The stomach, small bowel, and
large bowel are otherwise unremarkable. Appendix normal.

Lymphatic: No acute bone abnormality.

Reproductive: Prostate is unremarkable.

Other: Small fat containing left inguinal hernia. Tiny broad-based
fat containing umbilical hernia.

Musculoskeletal: No acute bone abnormality. No lytic or blastic bone
lesion.

Review of the MIP images confirms the above findings.
IMPRESSION: No evidence of thoracoabdominal aortic aneurysm or dissection.

Mild uncomplicated sigmoid diverticulitis.

Mild hepatic steatosis.

Small fat containing umbilical and left inguinal hernias.

## 2021-02-09 MED ORDER — HYDROMORPHONE HCL 1 MG/ML IJ SOLN
1.0000 mg | Freq: Once | INTRAMUSCULAR | Status: AC
  Administered 2021-02-09: 1 mg via INTRAVENOUS
  Filled 2021-02-09: qty 1

## 2021-02-09 MED ORDER — IOHEXOL 350 MG/ML SOLN
100.0000 mL | Freq: Once | INTRAVENOUS | Status: AC | PRN
  Administered 2021-02-09: 100 mL via INTRAVENOUS

## 2021-02-09 MED ORDER — SODIUM CHLORIDE (PF) 0.9 % IJ SOLN
INTRAMUSCULAR | Status: AC
  Filled 2021-02-09: qty 50

## 2021-02-09 MED ORDER — CYCLOBENZAPRINE HCL 10 MG PO TABS
5.0000 mg | ORAL_TABLET | Freq: Once | ORAL | Status: AC
  Administered 2021-02-09: 5 mg via ORAL
  Filled 2021-02-09: qty 1

## 2021-02-09 MED ORDER — CYCLOBENZAPRINE HCL 10 MG PO TABS: 10.0000 mg | ORAL_TABLET | Freq: Two times a day (BID) | ORAL | 0 refills | Status: DC | PRN

## 2021-02-09 MED ORDER — AMOXICILLIN-POT CLAVULANATE 875-125 MG PO TABS: 1.0000 | ORAL_TABLET | Freq: Two times a day (BID) | ORAL | 0 refills | Status: DC

## 2021-02-09 MED ORDER — OXYCODONE-ACETAMINOPHEN 5-325 MG PO TABS
2.0000 | ORAL_TABLET | Freq: Once | ORAL | Status: AC
  Administered 2021-02-09: 2 via ORAL
  Filled 2021-02-09: qty 2

## 2021-02-09 MED ORDER — AMOXICILLIN-POT CLAVULANATE 875-125 MG PO TABS
1.0000 | ORAL_TABLET | Freq: Once | ORAL | Status: AC
  Administered 2021-02-09: 1 via ORAL
  Filled 2021-02-09: qty 1

## 2021-02-09 NOTE — ED Notes (Signed)
Patient transported to CT 

## 2021-02-14 ENCOUNTER — Ambulatory Visit (INDEPENDENT_AMBULATORY_CARE_PROVIDER_SITE_OTHER): Payer: Medicare HMO | Admitting: Family

## 2021-02-14 ENCOUNTER — Encounter: Payer: Self-pay | Admitting: Family

## 2021-02-14 ENCOUNTER — Ambulatory Visit (INDEPENDENT_AMBULATORY_CARE_PROVIDER_SITE_OTHER): Payer: Medicare HMO

## 2021-02-14 ENCOUNTER — Other Ambulatory Visit: Payer: Self-pay

## 2021-02-14 VITALS — BP 139/93 | HR 79 | Temp 97.6°F | Wt 321.0 lb

## 2021-02-14 DIAGNOSIS — Z0000 Encounter for general adult medical examination without abnormal findings: Secondary | ICD-10-CM | POA: Diagnosis not present

## 2021-02-14 DIAGNOSIS — B20 Human immunodeficiency virus [HIV] disease: Secondary | ICD-10-CM

## 2021-02-14 DIAGNOSIS — Z23 Encounter for immunization: Secondary | ICD-10-CM

## 2021-02-14 MED ORDER — BIKTARVY 50-200-25 MG PO TABS: 1.0000 | ORAL_TABLET | Freq: Every day | ORAL | 5 refills | Status: DC

## 2021-02-14 NOTE — Assessment & Plan Note (Signed)
   Discussed importance of safe sexual practice and condom usage.  Condoms offered and declined.  COVID and influenza updated today.

## 2021-02-14 NOTE — Progress Notes (Signed)
   Covid-19 Vaccination Clinic  Name:  Alan Rangel    MRN: [id number] DOB: May 28, 1975  02/14/2021  Alan Rangel was observed post Covid-19 immunization for 15 minutes without incident. He was provided with Vaccine Information Sheet and instruction to access the V-Safe system.   Alan Rangel was instructed to call [phone] with any severe reactions post vaccine: Difficulty breathing  Swelling of face and throat  A fast heartbeat  A bad rash all over body  Dizziness and weakness     Smayan Hackbart T Pricilla Loveless

## 2021-02-14 NOTE — Assessment & Plan Note (Signed)
Mr. Tinnel continues to have well controlled virus with good adherence and tolerance to Descovy and Tivicay. No signs/symptoms of opportunistic infection. Reviewed previous lab work. Discussed plan of care and he is not eligible for Cabenuva secondary to resistance on multiple genotypes. Will change medication to Biktarvy. Check lab work in 1 month with pharmacy staff. Follow up in 3 months or sooner if needed with lab work on the same day.
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
## 2021-02-14 NOTE — Patient Instructions (Signed)
Nice to see you.  We will plan to check your blood work in 1 month with the Pharmacy Staff.   New prescription has been sent to the pharmacy.   Plan for follow up in 3 months or sooner if needed.   Have a great day and stay safe!

## 2021-03-09 ENCOUNTER — Telehealth: Payer: Self-pay

## 2021-03-09 NOTE — Telephone Encounter (Signed)
   Alan Rangel DOB: January 18, 1976 MRN: [id number]   RIDER WAIVER AND RELEASE OF LIABILITY  For purposes of improving physical access to our facilities, [facility] is pleased to partner with third parties to provide [place] patients or other authorized individuals the option of convenient, on-demand ground transportation services (the Chiropractor") through use of the technology service that enables users to request on-demand ground transportation from independent third-party providers.  By opting to use and accept these [employer], I, the undersigned, hereby agree on behalf of myself, and on behalf of any minor child using the Science writer for whom I am the parent or legal guardian, as follows:  Science writer provided to me are provided by independent third-party transportation providers who are not [employer] or employees and who are unaffiliated with [employer]. [facility] is neither a transportation carrier nor a common or public carrier. [facility] has no control over the quality or safety of the transportation that occurs as a result of the [employer]. [facility] cannot guarantee that any third-party transportation provider will complete any arranged transportation service. [facility] makes no representation, warranty, or guarantee regarding the reliability, timeliness, quality, safety, suitability, or availability of any of the Transport Services or that they will be error free. I fully understand that traveling by vehicle involves risks and dangers of serious bodily injury, including permanent disability, paralysis, and death. I agree, on behalf of myself and on behalf of any minor child using the Transport Services for whom I am the parent or legal guardian, that the entire risk arising out of my use of the [employer] remains solely with me, to the maximum extent permitted under applicable law. The [employer] are provided "as is"  and "as available." [facility] disclaims all representations and warranties, express, implied or statutory, not expressly set out in these terms, including the implied warranties of merchantability and fitness for a particular purpose. I hereby waive and release [facility], its agents, employees, officers, directors, representatives, insurers, attorneys, assigns, successors, subsidiaries, and affiliates from any and all past, present, or future claims, demands, liabilities, actions, causes of action, or suits of any kind directly or indirectly arising from acceptance and use of the [employer]. I further waive and release [facility] and its affiliates from all present and future liability and responsibility for any injury or death to persons or damages to property caused by or related to the use of the [employer]. I have read this Waiver and Release of Liability, and I understand the terms used in it and their legal significance. This Waiver is freely and voluntarily given with the understanding that my right (as well as the right of any minor child for whom I am the parent or legal guardian using the [employer]) to legal recourse against [facility] in connection with the [employer] is knowingly surrendered in return for use of these services.   I attest that I read the consent document to Alan Rangel, gave Alan Rangel the opportunity to ask questions and answered the questions asked (if any). I affirm that Alan Rangel then provided consent for he's participation in this program.     Alan Rangel

## 2021-03-15 ENCOUNTER — Telehealth: Payer: Self-pay | Admitting: Pharmacist

## 2021-03-15 NOTE — Telephone Encounter (Signed)
Cumulative HIV Genotype Data  Genotype Dates: 09/27/09, 01/30/16, 06/28/16  RT Mutations  E138A  PI Mutations  M36L, I62V, L63A  Integrase Mutations  None    Interpretation of Genotype Data per [employer] HIV Drug Resistance Database:  Nucleoside RTIs  Abacavir - susceptible Zidovudine - susceptible Emtricitabine - susceptible Lamivudine - susceptible Tenofovir - susceptible   Non-Nucleoside RTIs  Doravirine - susceptible Efavirenz - susceptible Etravirine - potential low-level resistance Nevirapine - susceptible Rilpivirine - low-level resistance   Protease Inhibitors  Atazanavir - susceptible Darunavir - susceptible Lopinavir - susceptible   Integrase Inhibitors  Bictegravir - susceptible Cabotegravir - susceptible Dolutegravir - susceptible Elvitegravir - susceptible Raltegravir - susceptible   Margarite Gouge, PharmD, CPP Clinical Pharmacist Practitioner Infectious Diseases Clinical Pharmacist [facility]

## 2021-03-17 ENCOUNTER — Ambulatory Visit: Payer: Medicare HMO | Admitting: Pharmacist

## 2021-04-13 ENCOUNTER — Encounter (HOSPITAL_COMMUNITY): Payer: Self-pay | Admitting: Emergency Medicine

## 2021-04-13 ENCOUNTER — Emergency Department (HOSPITAL_COMMUNITY)
Admission: EM | Admit: 2021-04-13 | Discharge: 2021-04-13 | Disposition: A | Discharge status: Home / Self Care | Payer: Medicare HMO | Attending: Emergency Medicine | Admitting: Emergency Medicine

## 2021-04-13 DIAGNOSIS — F17210 Nicotine dependence, cigarettes, uncomplicated: Secondary | ICD-10-CM | POA: Diagnosis not present

## 2021-04-13 DIAGNOSIS — J069 Acute upper respiratory infection, unspecified: Secondary | ICD-10-CM | POA: Insufficient documentation

## 2021-04-13 DIAGNOSIS — Z79899 Other long term (current) drug therapy: Secondary | ICD-10-CM | POA: Insufficient documentation

## 2021-04-13 DIAGNOSIS — R059 Cough, unspecified: Secondary | ICD-10-CM | POA: Diagnosis present

## 2021-04-13 DIAGNOSIS — Z21 Asymptomatic human immunodeficiency virus [HIV] infection status: Secondary | ICD-10-CM | POA: Diagnosis not present

## 2021-04-13 DIAGNOSIS — E119 Type 2 diabetes mellitus without complications: Secondary | ICD-10-CM | POA: Diagnosis not present

## 2021-04-13 DIAGNOSIS — Z20822 Contact with and (suspected) exposure to covid-19: Secondary | ICD-10-CM | POA: Diagnosis not present

## 2021-04-13 DIAGNOSIS — R112 Nausea with vomiting, unspecified: Secondary | ICD-10-CM | POA: Insufficient documentation

## 2021-04-13 DIAGNOSIS — I10 Essential (primary) hypertension: Secondary | ICD-10-CM | POA: Insufficient documentation

## 2021-04-13 LAB — RESP PANEL BY RT-PCR (FLU A&B, COVID) ARPGX2
Influenza A by PCR: NEGATIVE
Influenza B by PCR: NEGATIVE
SARS Coronavirus 2 by RT PCR: NEGATIVE

## 2021-04-13 NOTE — Discharge Instructions (Addendum)
Suspect your symptoms are due to an upper respiratory infection. These are almost always viral in nature, therefore antibiotics are not warranted. Manage your symptoms with supportive care including plenty of oral hydration, tylenol/ibuprofen as needed for fevers, with over the counter cold medication including Mucinex D (orange box) which you can get at your local pharmacy. Follow-up with your PCP in the next few days for continued management  Return if development of any new or worsening symptoms
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
## 2021-04-13 NOTE — ED Triage Notes (Signed)
Patient BIB [facility] from home with complaint of headaches, cough, generalized body aches for the last several days. 1000mg  acetaminophen by EMS. Patient alert, oriented, and in no apparent distress at this time.  EMS vitals BP 152/92 immediately after taking antihypertensives HR 85 95% on room air 97.43F

## 2021-04-13 NOTE — ED Notes (Addendum)
Pt left without receiving d/c paperwork and vitals.

## 2021-04-19 ENCOUNTER — Encounter: Payer: Self-pay | Admitting: Family

## 2021-05-17 ENCOUNTER — Encounter: Payer: Self-pay | Admitting: Family

## 2021-05-17 ENCOUNTER — Other Ambulatory Visit: Payer: Self-pay

## 2021-05-17 ENCOUNTER — Other Ambulatory Visit (HOSPITAL_COMMUNITY)
Admission: RE | Admit: 2021-05-17 | Discharge: 2021-05-17 | Disposition: A | Discharge status: Home / Self Care | Payer: Medicare HMO | Source: Ambulatory Visit | Attending: Family | Admitting: Family

## 2021-05-17 ENCOUNTER — Ambulatory Visit (INDEPENDENT_AMBULATORY_CARE_PROVIDER_SITE_OTHER): Payer: Medicare HMO | Admitting: Family

## 2021-05-17 VITALS — BP 127/84 | HR 82 | Temp 98.0°F | Resp 16 | Ht 73.0 in | Wt 313.6 lb

## 2021-05-17 DIAGNOSIS — B20 Human immunodeficiency virus [HIV] disease: Secondary | ICD-10-CM | POA: Diagnosis not present

## 2021-05-17 DIAGNOSIS — Z23 Encounter for immunization: Secondary | ICD-10-CM | POA: Diagnosis not present

## 2021-05-17 DIAGNOSIS — R0989 Other specified symptoms and signs involving the circulatory and respiratory systems: Secondary | ICD-10-CM

## 2021-05-17 DIAGNOSIS — Z113 Encounter for screening for infections with a predominantly sexual mode of transmission: Secondary | ICD-10-CM

## 2021-05-17 DIAGNOSIS — Z0000 Encounter for general adult medical examination without abnormal findings: Secondary | ICD-10-CM

## 2021-05-17 MED ORDER — BIKTARVY 50-200-25 MG PO TABS: 1.0000 | ORAL_TABLET | Freq: Every day | ORAL | 5 refills | Status: DC

## 2021-05-17 NOTE — Patient Instructions (Addendum)
Nice to see you.  We will check your lab work today.  Continue to take your medication daily as prescribed.  Continue with over the counter medications as needed at this point.   Refills have been sent to the pharmacy.  Plan for follow up in 4 month or sooner if needed with lab work on the same day.  Have a great day and stay safe!
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
## 2021-05-17 NOTE — Assessment & Plan Note (Signed)
Alan Rangel continues to have well-controlled virus with good adherence and tolerance to his ART regimen of Biktarvy.  No signs/symptoms of opportunistic infection.  Reviewed previous lab work and discussed plan of care.  Check blood work today.  Continue current dose of Biktarvy.  Plan for follow-up in 4 months or sooner if needed with lab work on the same day.

## 2021-05-17 NOTE — Assessment & Plan Note (Signed)
Alan Rangel was recently seen for likely viral process with negative COVID and influenza testing.  Suspect rhinovirus/common cold with some residual symptoms.  He continues to improve which is consistent with a viral process.  No clear evidence of infection at present.  Continue over-the-counter medications as needed for symptom relief and supportive care.  Follow-up if symptoms worsen or do not improve.

## 2021-05-17 NOTE — Assessment & Plan Note (Signed)
·   Discussed importance of safe sexual practices and condom use.  Condoms offered. °· Menveo updated. °

## 2021-05-18 LAB — HIV-1 RNA QUANT-NO REFLEX-BLD
HIV 1 RNA Quant: 22 Copies/mL — ABNORMAL HIGH
HIV-1 RNA Quant, Log: 1.34 Log cps/mL — ABNORMAL HIGH

## 2021-05-18 LAB — URINE CYTOLOGY ANCILLARY ONLY
Chlamydia: NEGATIVE
Comment: NEGATIVE
Comment: NORMAL
Neisseria Gonorrhea: NEGATIVE

## 2021-05-18 LAB — T-HELPER CELL (CD4) - (RCID CLINIC ONLY)
CD4 % Helper T Cell: 50 % (ref 33–65)
CD4 T Cell Abs: 1532 /uL (ref 400–1790)

## 2021-05-18 MED ORDER — STERILE WATER FOR INJECTION IJ SOLN
INTRAMUSCULAR | Status: AC
  Filled 2021-05-18: qty 10

## 2021-05-18 MED ORDER — ZIPRASIDONE MESYLATE 20 MG IM SOLR
INTRAMUSCULAR | Status: AC
  Filled 2021-05-18: qty 20

## 2021-06-09 ENCOUNTER — Emergency Department (HOSPITAL_COMMUNITY)
Admission: EM | Admit: 2021-06-09 | Discharge: 2021-06-10 | Disposition: A | Discharge status: Other Acute Inpatient Hospital | Payer: Medicare HMO | Attending: Emergency Medicine | Admitting: Emergency Medicine

## 2021-06-09 DIAGNOSIS — M545 Low back pain, unspecified: Secondary | ICD-10-CM | POA: Diagnosis not present

## 2021-06-09 DIAGNOSIS — R45851 Suicidal ideations: Secondary | ICD-10-CM | POA: Insufficient documentation

## 2021-06-09 DIAGNOSIS — Z21 Asymptomatic human immunodeficiency virus [HIV] infection status: Secondary | ICD-10-CM | POA: Diagnosis not present

## 2021-06-09 DIAGNOSIS — G8929 Other chronic pain: Secondary | ICD-10-CM | POA: Diagnosis not present

## 2021-06-09 DIAGNOSIS — Z20822 Contact with and (suspected) exposure to covid-19: Secondary | ICD-10-CM | POA: Insufficient documentation

## 2021-06-09 DIAGNOSIS — F4310 Post-traumatic stress disorder, unspecified: Secondary | ICD-10-CM | POA: Insufficient documentation

## 2021-06-09 DIAGNOSIS — Z046 Encounter for general psychiatric examination, requested by authority: Secondary | ICD-10-CM | POA: Diagnosis present

## 2021-06-09 DIAGNOSIS — D72829 Elevated white blood cell count, unspecified: Secondary | ICD-10-CM | POA: Diagnosis not present

## 2021-06-09 DIAGNOSIS — F251 Schizoaffective disorder, depressive type: Secondary | ICD-10-CM | POA: Diagnosis not present

## 2021-06-09 NOTE — ED Triage Notes (Signed)
BIB EMS from home for lower chronic back pain, patient states he takes Tylenol at home with no relief, pain 10/10 radiates down left leg

## 2021-06-10 ENCOUNTER — Emergency Department (HOSPITAL_COMMUNITY): Payer: Medicare HMO

## 2021-06-10 ENCOUNTER — Encounter (HOSPITAL_COMMUNITY): Payer: Self-pay | Admitting: Psychiatry

## 2021-06-10 ENCOUNTER — Other Ambulatory Visit: Payer: Self-pay

## 2021-06-10 ENCOUNTER — Other Ambulatory Visit: Payer: Self-pay | Admitting: Psychiatry

## 2021-06-10 ENCOUNTER — Encounter (HOSPITAL_COMMUNITY): Payer: Self-pay

## 2021-06-10 ENCOUNTER — Inpatient Hospital Stay (HOSPITAL_COMMUNITY)
Admission: AD | Admit: 2021-06-10 | Discharge: 2021-06-12 | DRG: 885 | Disposition: A | Discharge status: Home / Self Care | Payer: Medicare HMO | Source: Intra-hospital | Attending: Psychiatry | Admitting: Psychiatry

## 2021-06-10 DIAGNOSIS — F323 Major depressive disorder, single episode, severe with psychotic features: Principal | ICD-10-CM | POA: Diagnosis present

## 2021-06-10 DIAGNOSIS — F430 Acute stress reaction: Secondary | ICD-10-CM | POA: Diagnosis not present

## 2021-06-10 DIAGNOSIS — F4310 Post-traumatic stress disorder, unspecified: Secondary | ICD-10-CM | POA: Diagnosis present

## 2021-06-10 DIAGNOSIS — Z20822 Contact with and (suspected) exposure to covid-19: Secondary | ICD-10-CM | POA: Diagnosis present

## 2021-06-10 DIAGNOSIS — R45851 Suicidal ideations: Secondary | ICD-10-CM | POA: Diagnosis present

## 2021-06-10 DIAGNOSIS — Z818 Family history of other mental and behavioral disorders: Secondary | ICD-10-CM

## 2021-06-10 DIAGNOSIS — R3589 Other polyuria: Secondary | ICD-10-CM | POA: Diagnosis present

## 2021-06-10 DIAGNOSIS — G4700 Insomnia, unspecified: Secondary | ICD-10-CM | POA: Diagnosis present

## 2021-06-10 DIAGNOSIS — F449 Dissociative and conversion disorder, unspecified: Secondary | ICD-10-CM | POA: Diagnosis present

## 2021-06-10 DIAGNOSIS — F6089 Other specific personality disorders: Secondary | ICD-10-CM | POA: Diagnosis present

## 2021-06-10 DIAGNOSIS — F259 Schizoaffective disorder, unspecified: Principal | ICD-10-CM | POA: Diagnosis present

## 2021-06-10 DIAGNOSIS — Z62810 Personal history of physical and sexual abuse in childhood: Secondary | ICD-10-CM | POA: Diagnosis present

## 2021-06-10 DIAGNOSIS — F411 Generalized anxiety disorder: Secondary | ICD-10-CM | POA: Diagnosis present

## 2021-06-10 DIAGNOSIS — F251 Schizoaffective disorder, depressive type: Secondary | ICD-10-CM | POA: Diagnosis not present

## 2021-06-10 DIAGNOSIS — Z79899 Other long term (current) drug therapy: Secondary | ICD-10-CM

## 2021-06-10 DIAGNOSIS — Z79891 Long term (current) use of opiate analgesic: Secondary | ICD-10-CM | POA: Diagnosis not present

## 2021-06-10 DIAGNOSIS — F258 Other schizoaffective disorders: Secondary | ICD-10-CM | POA: Diagnosis present

## 2021-06-10 DIAGNOSIS — F22 Delusional disorders: Secondary | ICD-10-CM | POA: Diagnosis present

## 2021-06-10 LAB — CBC WITH DIFFERENTIAL/PLATELET
Abs Immature Granulocytes: 0.03 10*3/uL (ref 0.00–0.07)
Basophils Absolute: 0 10*3/uL (ref 0.0–0.1)
Basophils Relative: 0 %
Eosinophils Absolute: 0.3 10*3/uL (ref 0.0–0.5)
Eosinophils Relative: 2 %
HCT: 43.7 % (ref 39.0–52.0)
Hemoglobin: 14.5 g/dL (ref 13.0–17.0)
Immature Granulocytes: 0 %
Lymphocytes Relative: 42 %
Lymphs Abs: 4.5 10*3/uL — ABNORMAL HIGH (ref 0.7–4.0)
MCH: 32 pg (ref 26.0–34.0)
MCHC: 33.2 g/dL (ref 30.0–36.0)
MCV: 96.5 fL (ref 80.0–100.0)
Monocytes Absolute: 0.8 10*3/uL (ref 0.1–1.0)
Monocytes Relative: 8 %
Neutro Abs: 5.1 10*3/uL (ref 1.7–7.7)
Neutrophils Relative %: 48 %
Platelets: 213 10*3/uL (ref 150–400)
RBC: 4.53 MIL/uL (ref 4.22–5.81)
RDW: 13.7 % (ref 11.5–15.5)
WBC: 10.7 10*3/uL — ABNORMAL HIGH (ref 4.0–10.5)
nRBC: 0 % (ref 0.0–0.2)

## 2021-06-10 LAB — RESP PANEL BY RT-PCR (FLU A&B, COVID) ARPGX2
Influenza A by PCR: NEGATIVE
Influenza B by PCR: NEGATIVE
SARS Coronavirus 2 by RT PCR: NEGATIVE

## 2021-06-10 LAB — RAPID URINE DRUG SCREEN, HOSP PERFORMED
Amphetamines: NOT DETECTED
Barbiturates: NOT DETECTED
Benzodiazepines: NOT DETECTED
Cocaine: NOT DETECTED
Opiates: NOT DETECTED
Tetrahydrocannabinol: NOT DETECTED

## 2021-06-10 LAB — COMPREHENSIVE METABOLIC PANEL
ALT: 52 U/L — ABNORMAL HIGH (ref 0–44)
AST: 28 U/L (ref 15–41)
Albumin: 3.9 g/dL (ref 3.5–5.0)
Alkaline Phosphatase: 90 U/L (ref 38–126)
Anion gap: 6 (ref 5–15)
BUN: 14 mg/dL (ref 6–20)
CO2: 28 mmol/L (ref 22–32)
Calcium: 8.6 mg/dL — ABNORMAL LOW (ref 8.9–10.3)
Chloride: 103 mmol/L (ref 98–111)
Creatinine, Ser: 0.97 mg/dL (ref 0.61–1.24)
GFR, Estimated: >60 mL/min (ref >60)
Glucose, Bld: 85 mg/dL (ref 70–99)
Potassium: 3.1 mmol/L — ABNORMAL LOW (ref 3.5–5.1)
Sodium: 137 mmol/L (ref 135–145)
Total Bilirubin: 0.5 mg/dL (ref 0.3–1.2)
Total Protein: 6.5 g/dL (ref 6.5–8.1)

## 2021-06-10 LAB — ETHANOL: Alcohol, Ethyl (B): <10 mg/dL (ref <10)

## 2021-06-10 IMAGING — MR MR LUMBAR SPINE WO/W CM
7 of 8 series · 31 of 48 positions shown · IV contrast (gadavist)
Comparison: Lumbar spine MRI [DATE]

CLINICAL DATA: Cauda equina syndrome suspected

EXAM:
MRI LUMBAR SPINE WITHOUT AND WITH CONTRAST
TECHNIQUE: Multiplanar and multiecho pulse sequences of the lumbar spine were
obtained without and with intravenous contrast.
CONTRAST:  10mL GADAVIST GADOBUTROL 1 MMOL/ML IV SOLN

[Series 5: T1 · sagittal · 4.0mm · 0.81mm/px · 3 of 17 slices shown (1 of 2)]
[im 1/17]
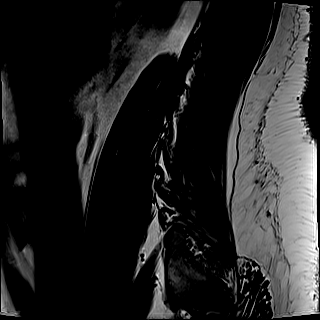
[im 9/17]
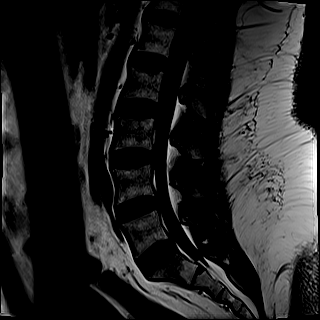
[im 17/17]
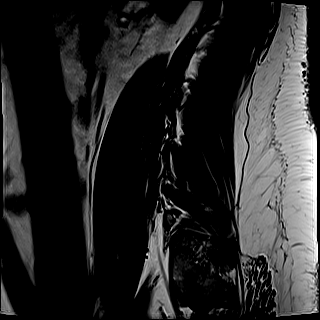

[Series 6: T2 · sagittal · 4.0mm · 0.81mm/px · 3 of 17 slices shown (1 of 2)]
[im 1/17]
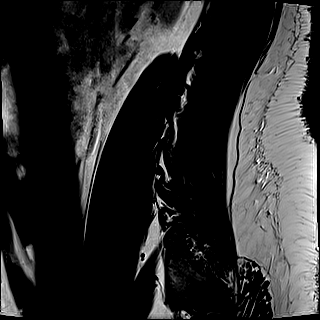
[im 9/17]
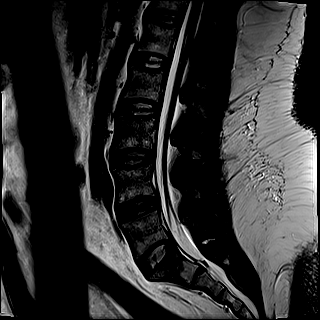
[im 17/17]
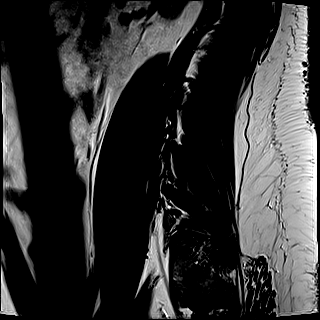

[Series 7: STIR · sagittal · 4.0mm · 0.51mm/px · 1 of 17 slices shown]
[im 1/17]
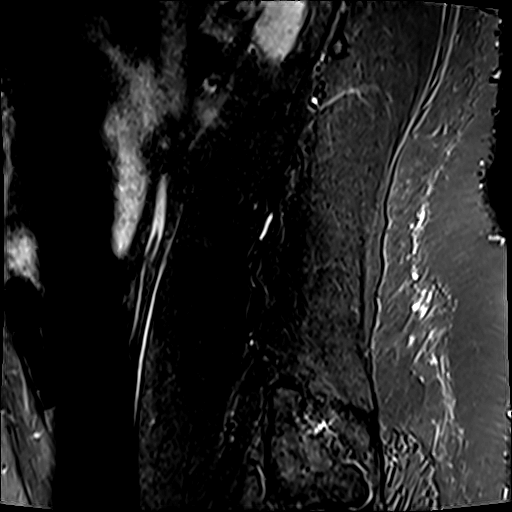

[Series 8: T2 · axial · 4.0mm · 0.62mm/px · z∈[-19,+205]mm · 8 of 46 slices shown (2 of 2)]
[im 1/46]
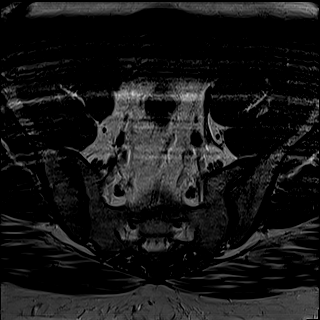
[im 6/46]
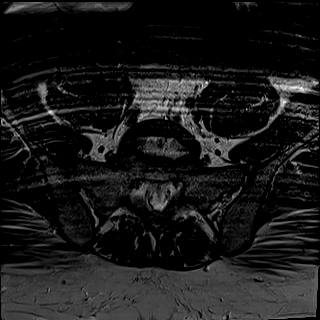
[im 16/46]
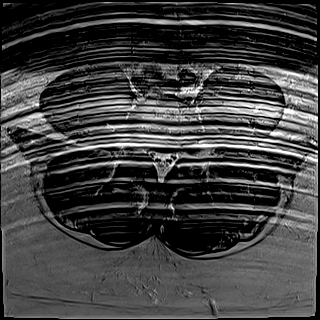
[im 21/46]
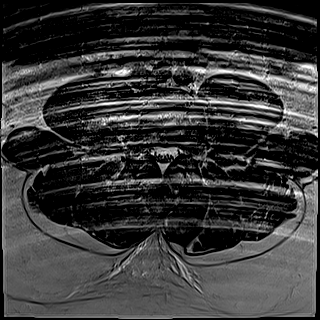
[im 26/46]
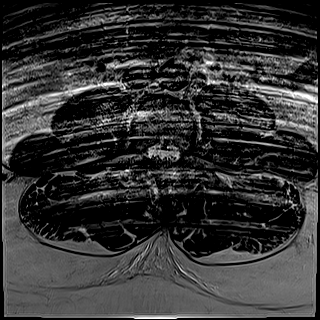
[im 31/46]
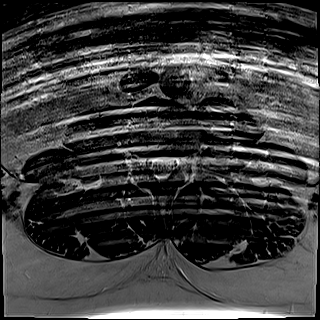
[im 41/46]
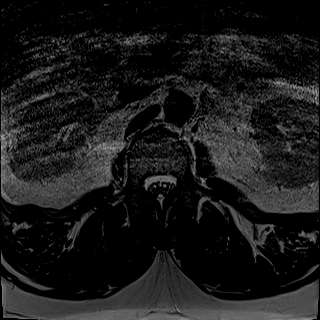
[im 46/46]
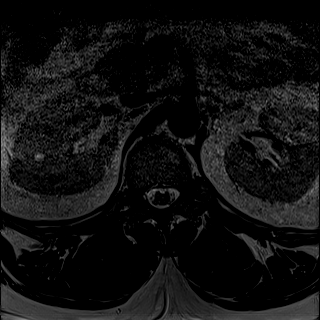

[Series 9: T1 · axial · 4.0mm · 0.39mm/px · z∈[-19,+205]mm · 8 of 46 slices shown (2 of 2)]
[im 1/46]
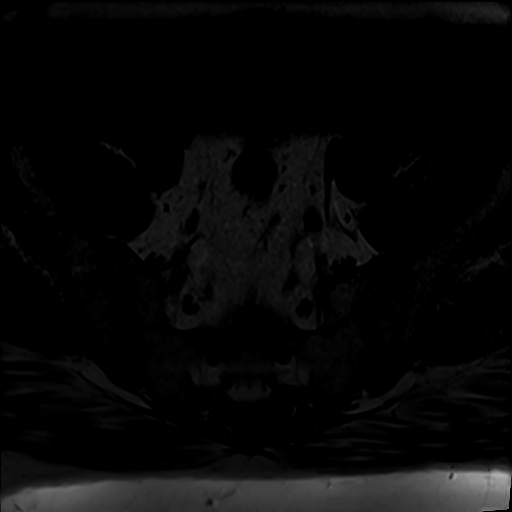
[im 6/46]
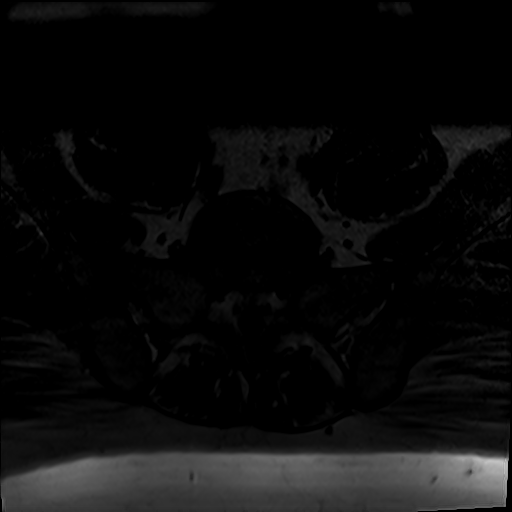
[im 16/46]
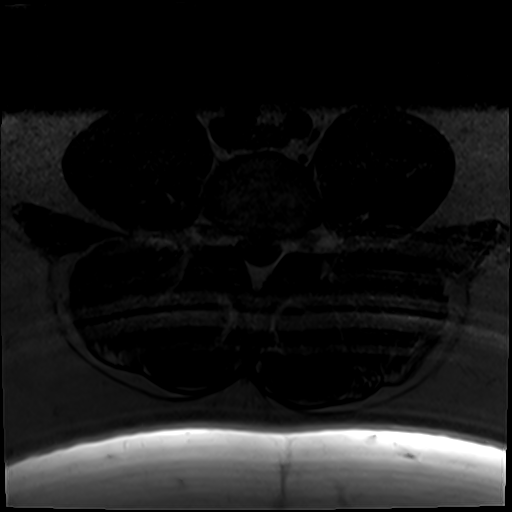
[im 21/46]
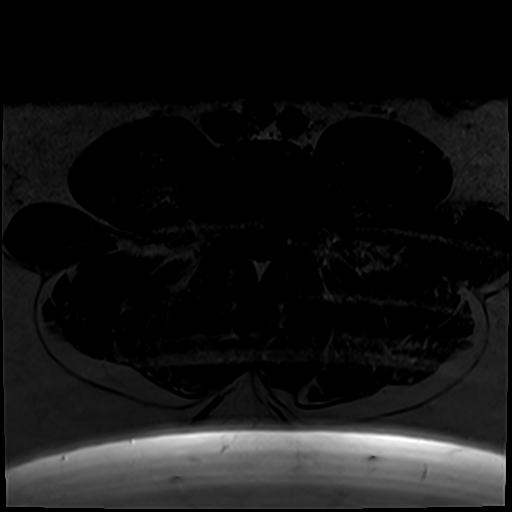
[im 26/46]
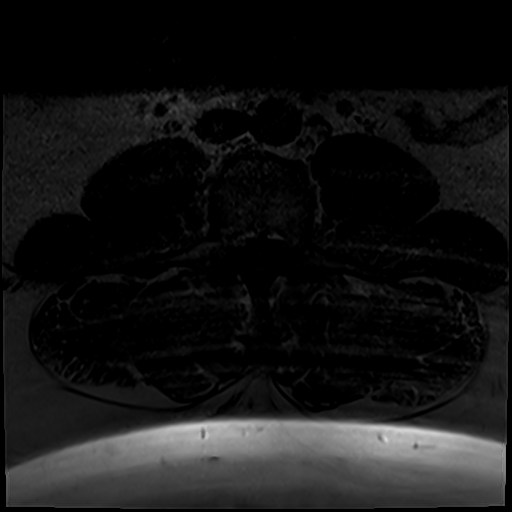
[im 31/46]
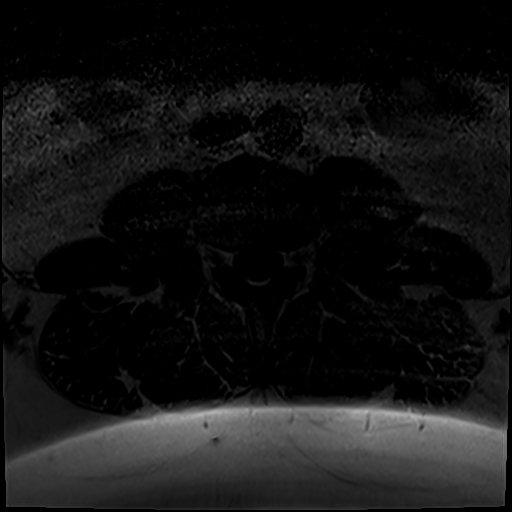
[im 41/46]
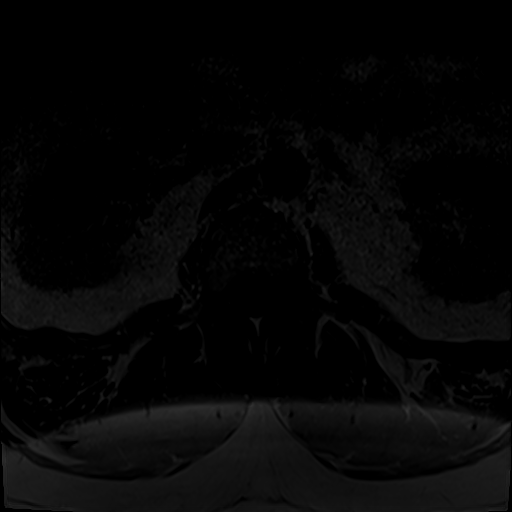
[im 46/46]
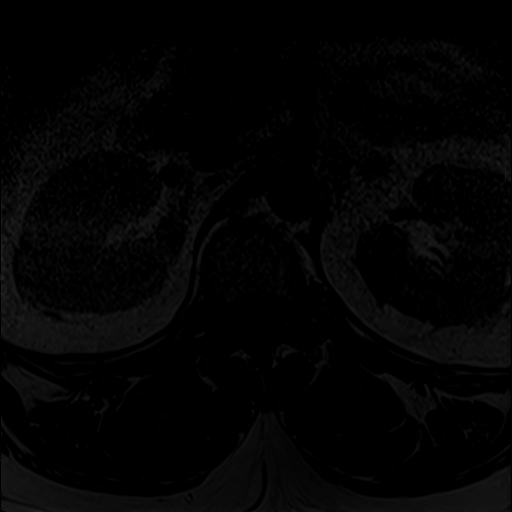

[Series 12: T2 post-contrast · sagittal · 4.0mm · 0.81mm/px · 4 of 17 slices shown]
[im 1/17]
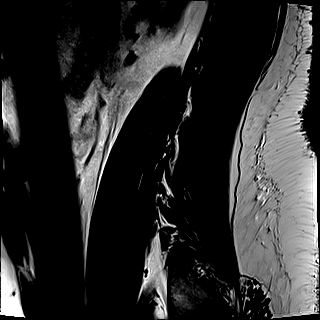
[im 6/17]
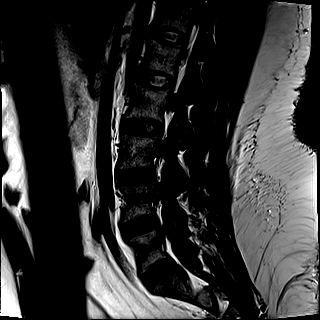
[im 11/17]
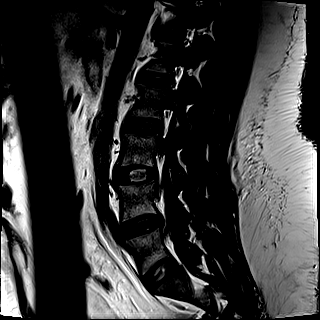
[im 17/17]
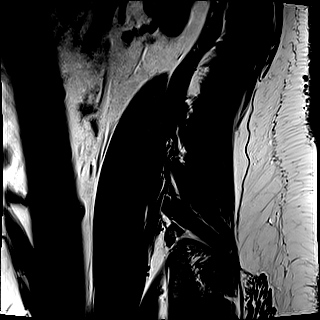

[Series 13: T1 fat-sat post-contrast · sagittal · 4.0mm · 0.81mm/px · 4 of 17 slices shown]
[im 1/17]
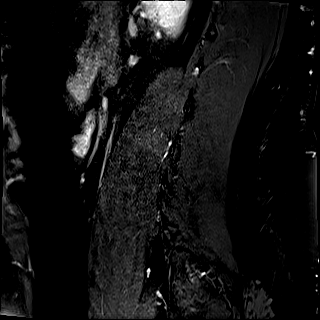
[im 6/17]
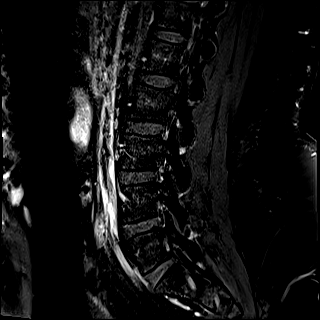
[im 11/17]
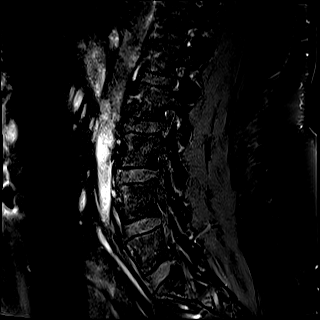
[im 17/17]
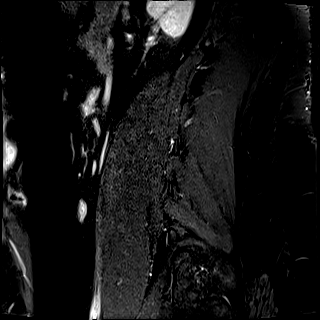

[31 of 48 positions shown; findings below may reference images not displayed]

FINDINGS: Segmentation: Standard; the lowest formed disc space is designated
L5-S1

Alignment:  Normal.

Vertebrae: Vertebral body heights are preserved. There is no marrow
signal abnormality. There is no marrow edema or abnormal
enhancement.

Conus medullaris and cauda equina: Conus extends to the L1 level.
Conus and cauda equina appear normal. There is no abnormal
enhancement of the cauda equina nerve roots.

Paraspinal and other soft tissues: Unremarkable.

Disc levels:

The disc heights are overall preserved. There is mild multilevel
facet arthropathy, most advanced at L4-L5.

T12-L1: No significant spinal canal or neural foraminal stenosis

L1-L2: No significant spinal canal or neural foraminal stenosis

L3-L4: No significant spinal canal or neural foraminal stenosis.

L4-L5: There is a mild disc bulge and mild bilateral facet
arthropathy without significant spinal canal or neural foraminal
stenosis.

L5-S1: There is mild bilateral facet arthropathy without significant
spinal canal or neural foraminal stenosis.

Above findings are overall not significantly changed since
[DATE].
IMPRESSION: Minimal degenerative change in the lumbar spine as above without
significant spinal canal or neural foraminal stenosis. No abnormal
enhancement. No significant interval change since [DATE].

## 2021-06-10 MED ORDER — TRIAMTERENE-HCTZ 37.5-25 MG PO TABS
1.0000 | ORAL_TABLET | Freq: Every day | ORAL | Status: DC
Start: 2021-06-11 — End: 2021-06-12
  Administered 2021-06-11 – 2021-06-12 (×2): 1 via ORAL
  Filled 2021-06-10 (×5): qty 1

## 2021-06-10 MED ORDER — TRIAMTERENE-HCTZ 37.5-25 MG PO TABS
1.0000 | ORAL_TABLET | Freq: Every day | ORAL | Status: DC
  Administered 2021-06-10: 1 via ORAL
  Filled 2021-06-10: qty 1

## 2021-06-10 MED ORDER — KETOROLAC TROMETHAMINE 15 MG/ML IJ SOLN
15.0000 mg | Freq: Once | INTRAMUSCULAR | Status: AC
  Administered 2021-06-10: 15 mg via INTRAMUSCULAR
  Filled 2021-06-10: qty 1

## 2021-06-10 MED ORDER — CLONAZEPAM 0.5 MG PO TABS: 0.5000 mg | ORAL_TABLET | Freq: Once | ORAL | Status: DC | PRN

## 2021-06-10 MED ORDER — LAMOTRIGINE 100 MG PO TABS
200.0000 mg | ORAL_TABLET | Freq: Two times a day (BID) | ORAL | Status: DC
  Administered 2021-06-10: 200 mg via ORAL
  Filled 2021-06-10: qty 2

## 2021-06-10 MED ORDER — ZIPRASIDONE HCL 80 MG PO CAPS
80.0000 mg | ORAL_CAPSULE | Freq: Two times a day (BID) | ORAL | Status: DC
  Administered 2021-06-11: 80 mg via ORAL
  Filled 2021-06-10 (×6): qty 1

## 2021-06-10 MED ORDER — NICOTINE 14 MG/24HR TD PT24: 14.0000 mg | MEDICATED_PATCH | Freq: Every day | TRANSDERMAL | Status: DC

## 2021-06-10 MED ORDER — LOSARTAN POTASSIUM 50 MG PO TABS
50.0000 mg | ORAL_TABLET | Freq: Every day | ORAL | Status: DC
  Administered 2021-06-11 – 2021-06-12 (×2): 50 mg via ORAL
  Filled 2021-06-10 (×5): qty 1

## 2021-06-10 MED ORDER — ZIPRASIDONE HCL 20 MG PO CAPS
80.0000 mg | ORAL_CAPSULE | Freq: Two times a day (BID) | ORAL | Status: DC
  Administered 2021-06-10: 80 mg via ORAL
  Filled 2021-06-10: qty 4

## 2021-06-10 MED ORDER — CLONAZEPAM 0.5 MG PO TABS
0.5000 mg | ORAL_TABLET | Freq: Two times a day (BID) | ORAL | Status: DC | PRN
  Administered 2021-06-10: 0.5 mg via ORAL
  Filled 2021-06-10 (×2): qty 1

## 2021-06-10 MED ORDER — ESCITALOPRAM OXALATE 10 MG PO TABS
10.0000 mg | ORAL_TABLET | Freq: Every day | ORAL | Status: DC
  Administered 2021-06-10: 10 mg via ORAL
  Filled 2021-06-10 (×2): qty 1

## 2021-06-10 MED ORDER — BICTEGRAVIR-EMTRICITAB-TENOFOV 50-200-25 MG PO TABS
1.0000 | ORAL_TABLET | Freq: Every day | ORAL | Status: DC
  Administered 2021-06-10: 1 via ORAL
  Filled 2021-06-10: qty 1

## 2021-06-10 MED ORDER — POTASSIUM CHLORIDE CRYS ER 20 MEQ PO TBCR
40.0000 meq | EXTENDED_RELEASE_TABLET | Freq: Once | ORAL | Status: AC
  Administered 2021-06-10: 40 meq via ORAL
  Filled 2021-06-10: qty 2

## 2021-06-10 MED ORDER — LOSARTAN POTASSIUM 25 MG PO TABS
50.0000 mg | ORAL_TABLET | Freq: Every day | ORAL | Status: DC
  Administered 2021-06-10: 50 mg via ORAL
  Filled 2021-06-10 (×2): qty 2

## 2021-06-10 MED ORDER — GADOBUTROL 1 MMOL/ML IV SOLN
10.0000 mL | Freq: Once | INTRAVENOUS | Status: AC | PRN
  Administered 2021-06-10: 10 mL via INTRAVENOUS

## 2021-06-10 MED ORDER — CLONAZEPAM 0.5 MG PO TABS
0.5000 mg | ORAL_TABLET | Freq: Two times a day (BID) | ORAL | Status: DC | PRN
  Administered 2021-06-10: 0.5 mg via ORAL
  Filled 2021-06-10: qty 1

## 2021-06-10 MED ORDER — ACETAMINOPHEN 325 MG PO TABS
650.0000 mg | ORAL_TABLET | Freq: Four times a day (QID) | ORAL | Status: DC | PRN
  Administered 2021-06-10 – 2021-06-12 (×3): 650 mg via ORAL
  Filled 2021-06-10 (×3): qty 2

## 2021-06-10 MED ORDER — BICTEGRAVIR-EMTRICITAB-TENOFOV 50-200-25 MG PO TABS
1.0000 | ORAL_TABLET | Freq: Every day | ORAL | Status: DC
  Administered 2021-06-11 – 2021-06-12 (×2): 1 via ORAL
  Filled 2021-06-10 (×5): qty 1

## 2021-06-10 MED ORDER — NICOTINE 21 MG/24HR TD PT24
21.0000 mg | MEDICATED_PATCH | Freq: Every day | TRANSDERMAL | Status: DC
  Administered 2021-06-10: 21 mg via TRANSDERMAL
  Filled 2021-06-10 (×2): qty 1

## 2021-06-10 MED ORDER — ATORVASTATIN CALCIUM 10 MG PO TABS
10.0000 mg | ORAL_TABLET | Freq: Every day | ORAL | Status: DC
  Administered 2021-06-10: 10 mg via ORAL
  Filled 2021-06-10 (×2): qty 1

## 2021-06-10 MED ORDER — ESCITALOPRAM OXALATE 10 MG PO TABS
10.0000 mg | ORAL_TABLET | Freq: Every day | ORAL | Status: DC
  Administered 2021-06-11 – 2021-06-12 (×2): 10 mg via ORAL
  Filled 2021-06-10 (×5): qty 1

## 2021-06-10 MED ORDER — ATORVASTATIN CALCIUM 10 MG PO TABS
10.0000 mg | ORAL_TABLET | Freq: Every day | ORAL | Status: DC
  Administered 2021-06-11 – 2021-06-12 (×2): 10 mg via ORAL
  Filled 2021-06-10 (×5): qty 1

## 2021-06-10 MED ORDER — NICOTINE 14 MG/24HR TD PT24
14.0000 mg | MEDICATED_PATCH | Freq: Every day | TRANSDERMAL | Status: DC
  Administered 2021-06-11 – 2021-06-12 (×2): 14 mg via TRANSDERMAL
  Filled 2021-06-10 (×6): qty 1

## 2021-06-10 NOTE — ED Provider Notes (Signed)
Patient care handed off this morning at 0645 from [facility], [place].  Please see her note for full details of patient encounter.  Briefly, this is a 46 year old male who presents emergency department with acute on chronic low back pain.  On exam of the patient there was questionable history of incontinence.  Per off going PA, patient has good rectal tone, bladder scan with only 150 mL urine, patient has been ambulatory.  Patient receiving MRI lumbar spine to rule out any acute stenosis.  Patient also here for suicidal behavior.  Been evaluated by TTS who recommends inpatient treatment. Physical Exam  BP (!) 146/100    Pulse 61    Temp 98.3 F (36.8 C) (Oral)    Resp 19    Ht 6\' 1"  (1.854 m)    Wt (!) 137.4 kg    SpO2 95%    BMI 39.98 kg/m   Physical Exam Vitals and nursing note reviewed.  Constitutional:      General: He is not in acute distress.    Appearance: Normal appearance.  HENT:     Head: Normocephalic and atraumatic.  Eyes:     General: No scleral icterus.    Pupils: Pupils are equal, round, and reactive to light.  Pulmonary:     Effort: Pulmonary effort is normal. No respiratory distress.  Skin:    Findings: No rash.  Neurological:     General: No focal deficit present.     Mental Status: He is alert and oriented to person, place, and time. Mental status is at baseline.  Psychiatric:        Mood and Affect: Mood normal.        Behavior: Behavior normal.        Thought Content: Thought content normal.        Judgment: Judgment normal.    Procedures  Procedures  ED Course / MDM    Medical Decision Making Amount and/or Complexity of Data Reviewed Labs: ordered. Radiology: ordered.  Risk OTC drugs. Prescription drug management.  Patient presents as noted above.  After my handoff patient received MRI of his lumbar spine which was negative for any acute stenosis or cauda equina.  He has been medically cleared to be evaluated by TTS.  TTS evaluated patient and  determined that he needed inpatient psychiatric care.  He is currently pending placement.        , PA-C 06/10/21 1239    06/12/21, MD 06/10/21 2005

## 2021-06-10 NOTE — Progress Notes (Signed)
Per Loren Racer, patient meets criteria for inpatient treatment. There are no available or appropriate beds at [facility] today. CSW faxed referrals to the following facilities for review:  [facility]  Pending - No Request Sent N/A [address], [place] [address] [phone] --  [facility]  Pending - No Request Sent N/A [address], [place] [address] [phone] --  [facility]  Pending - No Request Sent N/A [address] [place] [address] [phone] --  [facility]  Pending - No Request Sent N/A [address] [place] [address] [phone] --  [facility]  Pending - No Request Sent N/A [address] [place] [address] [phone] --  [facility]  Pending - No Request Sent N/A [address] [place] [address] [phone] --  [facility]  Pending - No Request Sent N/A [address] [place], [place] [address] [phone] --  [facility]  Pending - No Request Sent N/A [address] [place] [address] [phone] --  [facility]  Pending - No Request Sent N/A [address], [place] [address] [phone] --  [facility]  Pending - No Request Sent N/A [address] [place] [address] [phone] --  [facility]  Pending - No Request Sent N/A [address] [place] [address] [phone] --  [facility]  Pending - No Request Sent N/A [address], [place] [address] [phone] --  [facility]  Pending - No Request Sent N/A [address] [place] [address] [phone] --  [facility]  Pending - No Request Sent N/A [address] [place] [place] [address] [phone] --  [facility]  Pending - No Request Sent N/A [address] [place] [address] [phone] --  [facility]  Pending - No Request Sent N/A [address] [place] [address] [phone] --    TTS will continue to seek bed placement.  Crissie Reese, MSW, LCSW-A, LCAS-A Phone: [phone] Disposition/TOC
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
## 2021-06-10 NOTE — Progress Notes (Signed)
Pt is a 46 year old male who presented voluntarily from [facility] for complaints of increasing depression, anxiety, and SI. Pt has a hx of schizoaffective disorder, PTSD, and is HIV +. Patient was calm and cooperative during admission interview and assessment, but was very sedated and had trouble keeping his eyes open. Pt denied SI/HI and A/VH at time of assessment. VS monitored and recorded. Skin check performed with RN. Belongings searched and secured in locker. Q 15 min checks initiated for safety.
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
## 2021-06-10 NOTE — ED Notes (Signed)
Pt meal tray placed at bedside. Sitter present. Pt resting.

## 2021-06-10 NOTE — ED Notes (Signed)
Report given to [facility] RN 

## 2021-06-10 NOTE — ED Notes (Signed)
Patient's belongings 3 bags total placed in cabinet at 1-4 nurses station and knife given to security

## 2021-06-10 NOTE — Progress Notes (Signed)
Adult Psychoeducational Group Note  Date:  06/10/2021 Time:  11:49 PM  Group Topic/Focus:  Wrap-Up Group:   The focus of this group is to help patients review their daily goal of treatment and discuss progress on daily workbooks.  Participation Level:  Active  Participation Quality:  Appropriate  Affect:  Appropriate  Cognitive:  Appropriate  Insight: Appropriate  Engagement in Group:  Engaged  Modes of Intervention:  Discussion  Additional Comments: Pt stated his goal for today was to focus on his treatment plan. Pt stated he accomplished his goal today. Pt stated he talked with his doctor and social worker about his care today. Pt rated his overall day a 3 out of 10. Pt stated he made no calls today. Pt stated he felt better about himself today. Pt stated he took all medications provided today. Pt stated his appetite was fair today. Pt rated sleep last night was poor. Pt stated the goal tonight was to get some rest. Pt stated he had some physical pain tonight. Pt stated he some severe pain in his lower back. Pt rated the severe pain in his lower back an 8 on the pain level scale. Pt nurse was updated on the situation. Pt deny visual hallucinations and auditory issues tonight. Pt denies thoughts of harming himself or others. Pt stated he would alert staff if anything changed  Felipa Furnace 06/10/2021, 11:49 PM

## 2021-06-10 NOTE — Tx Team (Signed)
Initial Treatment Plan 06/10/2021 5:33 PM Alan Rangel [id number]    PATIENT STRESSORS: Health problems   Other: mental health     PATIENT STRENGTHS: Average or above average intelligence  Capable of independent living  Communication skills  Financial means  Motivation for treatment/growth  Special hobby/interest    PATIENT IDENTIFIED PROBLEMS: Suicidal ideation    "Mentally off---very, very low"     Increased depression             DISCHARGE CRITERIA:  Improved stabilization in mood, thinking, and/or behavior Reduction of life-threatening or endangering symptoms to within safe limits Verbal commitment to aftercare and medication compliance  PRELIMINARY DISCHARGE PLAN: Outpatient therapy Return to previous living arrangement  PATIENT/FAMILY INVOLVEMENT: This treatment plan has been presented to and reviewed with the patient, Alan Rangel,   The patient has been given the opportunity to ask questions and make suggestions.  Shela Nevin, RN 06/10/2021, 5:33 PM

## 2021-06-10 NOTE — Progress Notes (Signed)
Per Rosey Bath, [facility], pt has been accepted to [facility] bed 407-01. Accepting provider is Leroy Sea, Attending provider is Dr. Sherron Flemings. Patient can arrive by 1500. Number for report is [phone].   Crissie Reese, MSW, LCSW-A Phone: [phone] Disposition/TOC

## 2021-06-11 ENCOUNTER — Encounter (HOSPITAL_COMMUNITY): Payer: Self-pay | Admitting: Psychiatry

## 2021-06-11 DIAGNOSIS — Z79899 Other long term (current) drug therapy: Secondary | ICD-10-CM

## 2021-06-11 DIAGNOSIS — Z79891 Long term (current) use of opiate analgesic: Secondary | ICD-10-CM

## 2021-06-11 HISTORY — DX: Other long term (current) drug therapy: Z79.899

## 2021-06-11 HISTORY — DX: Long term (current) use of opiate analgesic: Z79.891

## 2021-06-11 MED ORDER — MELATONIN 3 MG PO TABS
3.0000 mg | ORAL_TABLET | Freq: Every day | ORAL | Status: DC
  Administered 2021-06-11: 3 mg via ORAL
  Filled 2021-06-11 (×4): qty 1

## 2021-06-11 MED ORDER — GABAPENTIN 600 MG PO TABS
300.0000 mg | ORAL_TABLET | Freq: Two times a day (BID) | ORAL | Status: DC | PRN
  Administered 2021-06-11: 300 mg via ORAL
  Filled 2021-06-11: qty 1

## 2021-06-11 MED ORDER — GABAPENTIN 300 MG PO CAPS
300.0000 mg | ORAL_CAPSULE | Freq: Three times a day (TID) | ORAL | Status: DC | PRN
  Administered 2021-06-12: 300 mg via ORAL
  Filled 2021-06-11: qty 1

## 2021-06-11 MED ORDER — ADULT MULTIVITAMIN W/MINERALS CH
1.0000 | ORAL_TABLET | Freq: Every day | ORAL | Status: DC
  Administered 2021-06-11 – 2021-06-12 (×2): 1 via ORAL
  Filled 2021-06-11 (×4): qty 1

## 2021-06-11 MED ORDER — ZIPRASIDONE HCL 40 MG PO CAPS
40.0000 mg | ORAL_CAPSULE | Freq: Every day | ORAL | Status: DC
  Filled 2021-06-11 (×4): qty 1

## 2021-06-11 MED ORDER — LIDOCAINE 5 % EX PTCH: 1.0000 | MEDICATED_PATCH | Freq: Every day | CUTANEOUS | Status: DC | PRN

## 2021-06-11 MED ORDER — LORAZEPAM 1 MG PO TABS: 1.0000 mg | ORAL_TABLET | Freq: Four times a day (QID) | ORAL | Status: DC | PRN

## 2021-06-11 MED ORDER — THIAMINE HCL 100 MG/ML IJ SOLN
100.0000 mg | Freq: Once | INTRAMUSCULAR | Status: AC
  Administered 2021-06-11: 100 mg via INTRAMUSCULAR
  Filled 2021-06-11: qty 2

## 2021-06-11 MED ORDER — ONDANSETRON 4 MG PO TBDP: 4.0000 mg | ORAL_TABLET | Freq: Four times a day (QID) | ORAL | Status: DC | PRN

## 2021-06-11 MED ORDER — LOPERAMIDE HCL 2 MG PO CAPS: 2.0000 mg | ORAL_CAPSULE | ORAL | Status: DC | PRN

## 2021-06-11 MED ORDER — THIAMINE HCL 100 MG PO TABS
100.0000 mg | ORAL_TABLET | Freq: Every day | ORAL | Status: DC
  Administered 2021-06-12: 100 mg via ORAL
  Filled 2021-06-11 (×4): qty 1

## 2021-06-11 MED ORDER — ZIPRASIDONE HCL 60 MG PO CAPS
60.0000 mg | ORAL_CAPSULE | Freq: Every day | ORAL | Status: DC
  Filled 2021-06-11 (×2): qty 1

## 2021-06-11 MED ORDER — TRAZODONE HCL 50 MG PO TABS
50.0000 mg | ORAL_TABLET | Freq: Once | ORAL | Status: AC
  Administered 2021-06-11: 50 mg via ORAL
  Filled 2021-06-11 (×2): qty 1

## 2021-06-11 MED ORDER — HYDROXYZINE HCL 25 MG PO TABS
25.0000 mg | ORAL_TABLET | Freq: Four times a day (QID) | ORAL | Status: DC | PRN
Start: 2021-06-11 — End: 2021-06-12
  Administered 2021-06-11: 25 mg via ORAL
  Filled 2021-06-11: qty 1

## 2021-06-11 NOTE — Group Note (Signed)
[facility] LCSW Group Therapy Note  06/11/2021  10:00-11:00AM  Type of Therapy and Topic:  Group Therapy:  Building Supports  Participation Level:  Minimal   Description of Group:  Patients in this group were introduced to the idea of adding a variety of healthy supports to address the various needs in their lives.  Different types of support were defined and described, and patients were asked to act out what each type could be.  Patients discussed what additional healthy supports could be helpful in their recovery and wellness after discharge in order to prevent future hospitalizations.   An emphasis was placed on following up with the discharge plan when they leave the hospital in order to continue becoming healthier and happier.  Two songs were played during group to help further patients' understanding.  Therapeutic Goals: 1)  demonstrate the importance of adding supports  2)  discuss 4 definitions of support  3)  identify the patient's current level of healthy support and   4)  elicit commitments to add one healthy support   Summary of Patient Progress:  The patient was late to group and listened attentively but did not contribute to the discussion.  The patient indicated one healthy support that could be helpful if added would be for people to listen to him and not try to come up with solutions for him.    Therapeutic Modalities:   Motivational Interviewing Brief Solution-Focused Therapy  Lynnell Chad

## 2021-06-11 NOTE — [facility] Group Notes (Signed)
Adult Psychoeducational Group Not Date:  06/11/2021 Time:  [id number] Group Topic/Focus: PROGRESSIVE RELAXATION. A group where deep breathing is taught and tensing and relaxation muscle groups is used. Imagery is used as well.  Pts are asked to imagine 3 pillars that hold them up when they are not able to hold themselves up.  Participation Level:  Active  Participation Quality:  Appropriate  Affect:  Appropriate  Cognitive:  Oriented  Insight: Improving  Engagement in Group:  Engaged  Modes of Intervention:  Activity, Discussion, Education, and Support  Additional Comments:  Pt rates his energy at a 4/10. States what holds him up is his home, his optimism and praying  Dione Housekeeper

## 2021-06-11 NOTE — H&P (Signed)
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 not abuse Klonopin. Today patient denied SI/HI/AVH, stated he does not know why he is here.  UDS (-), PDMP showed that patient had been receiving Klonopin and oxycodone-acetaminophen monthly since February 2021.  Patient made no mention of pain, despite [facility].  Continued home escitalopram 10 mg Tapered home Geodon 80 mg BID to 60 mg qAM & 80 mg qHS Held home Lamictal given selective med adherence  Chronic benzo & opioid therapy Concern for misuse, as patient made no mention of severe anxiety or chronic pain.  However he did continue to perseverate on how outpatient  provider provides it to him, trusting him, and that he is not misusing it.  For the past 2 days, patient had received Klonopin 0.5 mg x 1, low suspicion for seizures or withdrawal. Started CIWA with Ativan per protocol Held home Klonopin 0.5 mg BID and oxycodone-up acetaminophen Started gabapentin 300 mg TID PRN, will titrate up as necessary   We will continue patient's other home medications, per mar   [facility]  Patient denied collateral information  Daily contact with patient to assess and evaluate symptoms and progress in treatment and Medication management  Observation Level/Precautions:  15 minute checks  Laboratory:  Labs reviewed  Psychotherapy:  Unit group sessions and supportive treatment  [facility]  [facility]  Consultations:  To be determined   Discharge Concerns:  Safety, medication compliance, mood stability  Estimated LOS: 5-7 days  Other:  N/A   Long Term Goal(s): Improvement in symptoms so as ready for discharge  Short Term Goals: Ability to identify changes in lifestyle to reduce recurrence of condition will improve, Ability to verbalize feelings will improve, Ability to disclose and discuss suicidal ideas, Ability to demonstrate self-control will improve, Ability to identify and develop effective coping behaviors will improve, Ability to maintain clinical measurements within normal limits will improve, Compliance with prescribed medications will improve, and Ability to identify triggers associated with substance abuse/mental health issues will improve  I certify that inpatient services furnished can reasonably be expected to improve the patient's condition.    Signed: Princess BruinsJulie Marvel Mcphillips, DO Psychiatry Resident, PGY-1 [facility]  06/11/2021, 5:15 PM

## 2021-06-11 NOTE — Progress Notes (Signed)
D:  Alan Rangel was up and visible on the unit.  He did not attend evening wrap up group.  He did say his day was "confusing" because of all the medication changes and he is not happy that his clonazepam was stopped.  "I don't abuse my medications and I don't understand why I  have to be taken off them due to possibility of increase in confusion."  He continued to focus on needing to be discharged by Tuesday 06/13/21 because he has to pay his rent and he is moving the end of the week. Encouraged him to discuss discharge plans and concerns with his provider.  He adamantly denied SI/HI or AVH.  He reported his anxiety is better 5/10 (10 the worst) and he attributes this to his confusion of the day.  He did report that he enjoyed meeting people today in the groups.  He did not complain of back pain this evening.  He took his scheduled melatonin and prn hydroxyzine for anxiety with good relief.   A:  1:1 with RN for support and encouragement.  Medications given as ordered.  Q 15 minute checks maintained for safety.  Encouraged participation in group and unit activities.   R:  He is currently resting with his eyes closed and appears to be asleep.  He remains safe on the unit.  We will continue to monitor the progress towards his goals.

## 2021-06-11 NOTE — Progress Notes (Signed)
D:  Jedadiah was isolative to his room much of the evening.  He did come out for snack and bedtime medications.  He denied SI/HI or AVH.  He just stated his main complaint is "I just feel tired and anxious."  He rated his anxiety 8/10 and depression 5/10 (10 the worst).  He continued to report chronic lower back pain 8/10 (10 the worst).  Tylenol and Klonopin  with partial relief.  He requested something for sleep and order obtained for trazodone which he took with good relief.   A:  1:1 with RN for support and encouragement.  Medications given as ordered.  Q 15 minute checks maintained for safety.  Encouraged participation in group and unit activities.   R:  He is currently resting with his eyes closed and appears to be asleep.  He remains safe on the unit.  We will continue to monitor the progress towards his goals.

## 2021-06-11 NOTE — [facility] Suicide Risk Assessment (Signed)
[facility] INPATIENT:  Family/Significant Other Suicide Prevention Education  Suicide Prevention Education:  Patient Refusal for Family/Significant Other Suicide Prevention Education: The patient Alan Rangel has refused to provide written consent for family/significant other to be provided Family/Significant Other Suicide Prevention Education during admission and/or prior to discharge.  Physician notified.  Patient states he has nobody.  Carloyn Jaeger Grossman-Orr 06/11/2021, 1:05 PM

## 2021-06-11 NOTE — Progress Notes (Signed)
Psychoeducational Group Note  Date:  06/11/2021 Time:  2106  Group Topic/Focus:  Wrap-Up Group:   The focus of this group is to help patients review their daily goal of treatment and discuss progress on daily workbooks.  Participation Level: Did Not Attend  Participation Quality:  Not Applicable  Affect:  Not Applicable  Cognitive:  Not Applicable  Insight:  Not Applicable  Engagement in Group: Not Applicable  Additional Comments:  The patient did not attend group this evening.   Hazle Coca S 06/11/2021, 9:06 PM

## 2021-06-11 NOTE — Hospital Course (Addendum)
Alan Rangel is a 46 y.o. male with PPHx of MDD, GAD, past hospitalization who presented to who presented to [facility] ED via EMS after a reported panic attack and VH of him stabbing himself in the back in the mirror, then admitted Voluntary to [facility] for treatment acute stress disorder in the setting of moving to a new apartment.  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[place])   Other schizoaffective disorders ([place])   Generalized anxiety disorder  Patient's psychiatric medications were adjusted on admission:  Home meds: Escitalopram 10 mg, Geodon 80 mg BID, Klonopin 0.5 mg BID PRN Continued home Geodon 80 mg BID and Klonopin 0.5 mg BID PRN Held home Escitalopram 10 mg   During the hospitalization, other adjustments were made to the patient's psychiatric medication regimen:  Held home Klonopin 0.5 mg BID for concern of worsening anxiety and oversedation during the day Decreased home Geodon to 60 mg BID, with considerations of changing it to 60 mg qHS for daytime sedation  FOLLOW-UP Considerations: Please consider decreasing home Geodon 80 mg BID to qHS or switching agents if tolerated Please consider switching from current SSRI to SNRI given multiple past SSRI trial and failures for GAD
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
## 2021-06-11 NOTE — Progress Notes (Signed)
Pt denies SI/HI/VH but endorses hearing "noises" and verbally agrees to approach staff if these become apparent or before harming themselves/others. Rates depression 6/10. Rates anxiety 10/10. Rates pain 8/10 in lower back.  Pt stated that he felt jumpy, restless, startled, "all over the place," and that he did not sleep well because of that. Pt needs detailed explanations. Pt can get confused easily. Pt stated that he feels like a ball of yarn or ramen noddles. Pt was concerned about med change. MD notified. Scheduled medications administered to pt, per MD orders. RN provided support and encouragement to pt. Q15 min safety checks implemented and continued. Pt safe on the unit. RN will continue to monitor and intervene as needed.   06/11/21 0803  Psych Admission Type (Psych Patients Only)  Admission Status Voluntary  Psychosocial Assessment  Patient Complaints Anxiety;Depression;Nervousness;Restlessness  Eye Contact Brief  Facial Expression Sad;Animated;Anxious  Affect Sad  Speech Logical/coherent;Soft  Interaction Minimal;Guarded;Forwards little  Motor Activity Restless;Fidgety  Appearance/Hygiene Unremarkable  Behavior Characteristics Cooperative;Anxious;Fidgety  Mood Depressed;Anxious  Thought Process  Coherency WDL  Content WDL  Delusions None reported or observed  Perception Hallucinations  Hallucination Auditory  Judgment Poor  Confusion None  Danger to Self  Current suicidal ideation? Denies  Danger to Others  Danger to Others None reported or observed
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
## 2021-06-12 ENCOUNTER — Encounter (HOSPITAL_COMMUNITY): Payer: Self-pay

## 2021-06-12 DIAGNOSIS — F430 Acute stress reaction: Secondary | ICD-10-CM

## 2021-06-12 LAB — LAMOTRIGINE LEVEL: Lamotrigine Lvl: <1 ug/mL — ABNORMAL LOW (ref 2.0–20.0)

## 2021-06-12 MED ORDER — ADULT MULTIVITAMIN W/MINERALS CH
1.0000 | ORAL_TABLET | Freq: Every day | ORAL | Status: DC
  Filled 2021-06-12: qty 1

## 2021-06-12 MED ORDER — LIDOCAINE 5 % EX PTCH: 1.0000 | MEDICATED_PATCH | Freq: Every day | CUTANEOUS | 0 refills | Status: AC | PRN

## 2021-06-12 MED ORDER — ATORVASTATIN CALCIUM 10 MG PO TABS: 10.0000 mg | ORAL_TABLET | Freq: Every day | ORAL | 0 refills | Status: AC

## 2021-06-12 MED ORDER — MELATONIN 3 MG PO TABS: 3.0000 mg | ORAL_TABLET | Freq: Every day | ORAL | 0 refills | Status: AC

## 2021-06-12 NOTE — Group Note (Signed)
Date:  06/12/2021 Time:  10:27 AM  Group Topic/Focus:  Goals Group:   The focus of this group is to help patients establish daily goals to achieve during treatment and discuss how the patient can incorporate goal setting into their daily lives to aide in recovery. Orientation:   The focus of this group is to educate the patient on the purpose and policies of crisis stabilization and provide a format to answer questions about their admission.  The group details unit policies and expectations of patients while admitted.    Participation Level:  Active  Participation Quality:  Appropriate  Affect:  Appropriate  Cognitive:  Appropriate  Insight: Appropriate  Engagement in Group:  Engaged  Modes of Intervention:  Discussion  Additional Comments:  Pt wants to continue on the right mediation at the right time  Jaquita Rector 06/12/2021, 10:27 AM

## 2021-06-12 NOTE — Group Note (Signed)
Recreation Therapy Group Note   Group Topic:Stress Management  Group Date: 06/12/2021 Start Time: 0935 End Time: 0952 Facilitators: Victorino Sparrow, [place] Location: [address] Dayroom   Goal Area(s) Addresses:  Patient will identify positive stress management techniques. Patient will identify benefits of using stress management post d/c.  Group Description:  Meditation.  LRT played a meditation that focused on taking good intentions and positive thoughts into your day.  Patients were to listen and follow along as the meditation played to fully engage in the activity.   Affect/Mood: Appropriate   Participation Level: Active   Participation Quality: Independent   Behavior: Appropriate   Speech/Thought Process: Focused   Insight: Good   Judgement: Good   Modes of Intervention: Meditation   Patient Response to Interventions:  Engaged   Education Outcome:  Acknowledges education and In group clarification offered    Clinical Observations/Individualized Feedback: Pt attended and participated in group activity.    Plan: Continue to engage patient in RT group sessions 2-3x/week.   Victorino Sparrow, Glennis Brink  06/12/2021 12:37 PM
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
## 2021-06-12 NOTE — Group Note (Signed)
Date:  06/12/2021 Time:  11:10 AM  Group Topic/Focus:  Orientation:   The focus of this group is to educate the patient on the purpose and policies of crisis stabilization and provide a format to answer questions about their admission.  The group details unit policies and expectations of patients while admitted.    Participation Level:  Active  Participation Quality:  Appropriate  Affect:  Appropriate  Cognitive:  Appropriate  Insight: Appropriate  Engagement in Group:  Engaged  Modes of Intervention:  Discussion  Additional Comments:  Pt wants to be regulated on the right meds at the right time  Jaquita Rector 06/12/2021, 11:10 AM

## 2021-06-12 NOTE — Progress Notes (Signed)
Pt survey completed. RN met with pt and reviewed pt's discharge instructions. Pt verbalized understanding of discharge instructions and pt did not have any questions. RN reviewed and provided pt with a copy of SRA, AVS and Transition Record. RN returned pt's belongings to pt.  Prescriptions were given to pt. Pt denied SI/HI/AVH and voiced no concerns. Pt was appreciative of the care pt received at [facility]. Patient discharged to the lobby without incident.   06/12/21 0755  Psych Admission Type (Psych Patients Only)  Admission Status Voluntary  Psychosocial Assessment  Patient Complaints Anxiety  Eye Contact Fair  Facial Expression Sad  Affect Anxious;Preoccupied  Speech Logical/coherent  Interaction Assertive  Motor Activity Other (Comment) (WDL)  Appearance/Hygiene Unremarkable  Behavior Characteristics Cooperative;Appropriate to situation;Calm  Mood Depressed;Preoccupied  Thought Process  Coherency WDL  Content Preoccupation  Delusions None reported or observed  Perception WDL  Hallucination None reported or observed  Judgment Poor  Confusion None  Danger to Self  Current suicidal ideation? Denies  Danger to Others  Danger to Others None reported or observed

## 2021-06-12 NOTE — Progress Notes (Signed)
°  [facility] Adult Case Management Discharge Plan :  Will you be returning to the same living situation after discharge:  Yes,  Home  At discharge, do you have transportation home?: Yes,  Safe Transport  Do you have the ability to pay for your medications: Yes,  Medicare   Release of information consent forms completed and in the chart;  Patient's signature needed at discharge.  Patient to Follow up at:  Follow-up Information     Center, [facility]. Go on 06/13/2021.   Why: You have have an appointment with Gerrit Heck for therapy services on 06/13/21 at 1:15 pm. (If you wish to change to a male provider, please inquire at this appointment.  This appointment will be held in person. Please schedule an appointment for medication management services as we were unable to do so prior to discharge. Contact information: [address] [place] [address] [phone]                 Next level of care provider has access to [facility] Link:no  Safety Planning and Suicide Prevention discussed: Yes,  with the patient     Has patient been referred to the Quitline?: Patient refused referral  Patient has been referred for addiction treatment: N/A  Aram Beecham, LCSWA 06/12/2021, 10:44 AM

## 2021-06-12 NOTE — Discharge Summary (Signed)
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 relaxation therapies were also part of the unit programming.   # By the day of discharge patient was in much improved condition than upon admission. Symptoms were reported as significantly decreased or resolved completely. The patient denied having suicidal thoughts, intent or plan, which were explored carefully. The patient denied having homicidal thoughts. The patient denies having auditory hallucinations, visual hallucinations, paranoia, or other symptoms of psychosis. The patient reports that his mood was stable. The patient was motivated to continue taking medication with a goal of continued improvement in mental health.  Patient was discharged to SEE ABOVE with a plan to follow up as noted ABOVE.   Signed: Merrily Brittle, DO Psychiatry Resident,  PGY-1 [facility] 06/12/2021, 12:02 PM

## 2021-06-12 NOTE — BH IP Treatment Plan (Signed)
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   Chronically on opiate therapy   Current Medications:  Current Facility-Administered Medications  Medication Dose Route Frequency Provider Last Rate Last Admin   acetaminophen (TYLENOL) tablet 650 mg  650 mg Oral Q6H PRN Ajibola, Ene A, NP   650 mg at 06/12/21 0941   atorvastatin (LIPITOR) tablet 10 mg  10 mg Oral Daily Leevy-Johnson, Brooke A, NP   10 mg at 06/12/21 0755   bictegravir-emtricitabine-tenofovir AF (BIKTARVY) 50-200-25 MG per tablet 1 tablet  1 tablet Oral Daily Leevy-Johnson, Brooke A, NP   1 tablet at 06/12/21 0755   escitalopram (LEXAPRO) tablet 10 mg  10 mg Oral Daily Ajibola, Ene A, NP   10 mg at 06/12/21 0755   gabapentin (NEURONTIN) capsule 300 mg  300 mg Oral TID PRN Princess Bruins, DO   300 mg at 06/12/21 0941   hydrOXYzine (ATARAX) tablet 25 mg  25 mg Oral Q6H PRN Princess Bruins, DO   25 mg at 06/11/21 2130   lidocaine (LIDODERM) 5 % 1 patch  1 patch Transdermal Daily PRN Princess Bruins, DO       loperamide (IMODIUM) capsule 2-4 mg  2-4 mg Oral PRN Princess Bruins, DO       LORazepam (ATIVAN) tablet 1 mg  1 mg Oral Q6H PRN Princess Bruins, DO       losartan (COZAAR) tablet 50 mg  50 mg Oral Daily Ajibola, Ene A, NP   50 mg at 06/12/21 0755   melatonin tablet 3 mg  3 mg Oral QHS Princess Bruins, DO   3 mg at 06/11/21 2130   [START ON 06/13/2021] multivitamin with minerals tablet 1 tablet  1 tablet Oral Daily Jerrilyn Cairo M, RPH       nicotine (NICODERM CQ - dosed in mg/24 hours) patch 14 mg  14 mg Transdermal Daily Hill, Shelbie Hutching, MD   14 mg at 06/12/21 0754    ondansetron (ZOFRAN-ODT) disintegrating tablet 4 mg  4 mg Oral Q6H PRN Princess Bruins, DO       thiamine tablet 100 mg  100 mg Oral Daily Princess Bruins, DO   100 mg at 06/12/21 0755   triamterene-hydrochlorothiazide (MAXZIDE-25) 37.5-25 MG per tablet 1 tablet  1 tablet Oral Daily Ajibola, Ene A, NP   1 tablet at 06/12/21 0755   ziprasidone (GEODON) capsule 40 mg  40 mg Oral Q breakfast Princess Bruins, DO       ziprasidone (GEODON) capsule 60 mg  60 mg Oral Q supper Princess Bruins, DO       PTA Medications: Medications Prior to Admission  Medication Sig Dispense Refill Last Dose   bictegravir-emtricitabine-tenofovir AF (BIKTARVY) 50-200-25 MG TABS tablet Take 1 tablet by mouth daily.      Cholecalciferol (VITAMIN D3) 1.25 MG (50000 UT) TABS Take 50,000 Units by mouth once a week. Patent says he takes this every month.      clonazePAM (KLONOPIN) 0.5 MG tablet Take 1 tablet (0.5 mg total) by mouth 2 (two) times daily as needed (Anxiety). 10 tablet 0    cyclobenzaprine (FLEXERIL) 10 MG tablet Take 1 tablet (10 mg total) by mouth 2 (two) times daily as needed  for muscle spasms. (Patient not taking: Reported on 05/17/2021) 20 tablet 0    escitalopram (LEXAPRO) 10 MG tablet Take 1 tablet (10 mg total) by mouth daily. 30 tablet 0    losartan (COZAAR) 50 MG tablet Take 50 mg by mouth daily.      nicotine (NICODERM CQ - DOSED IN MG/24 HOURS) 14 mg/24hr patch Place 1 patch (14 mg total) onto the skin daily. 28 patch 0    nicotine (NICODERM CQ - DOSED IN MG/24 HR) 7 mg/24hr patch Place 7 mg onto the skin daily.      oxyCODONE-acetaminophen (PERCOCET/ROXICET) 5-325 MG tablet Take 1 tablet by mouth every 8 (eight) hours as needed for severe pain.      OZEMPIC, 0.25 OR 0.5 MG/DOSE, 2 MG/1.5ML SOPN Inject 0.5 mg into the skin once a week. Every Tuesday      triamterene-hydrochlorothiazide (MAXZIDE-25) 37.5-25 MG tablet Take 1 tablet by mouth daily. (Patient not taking: Reported on 06/10/2021)      ziprasidone (GEODON)  80 MG capsule Take 1 capsule (80 mg total) by mouth 2 (two) times daily with a meal. (Patient taking differently: Take 80 mg by mouth at bedtime.) 60 capsule 0     Patient Stressors: Health problems   Other: mental health    Patient Strengths: Average or above average intelligence  Capable of independent living  Education administratorCommunication skills  Financial means  Motivation for treatment/growth  Special hobby/interest   Treatment Modalities: Medication Management, Group therapy, Case management,  1 to 1 session with clinician, Psychoeducation, Recreational therapy.   Physician Treatment Plan for Primary Diagnosis: Acute stress disorder Long Term Goal(s):     Short Term Goals:    Medication Management: Evaluate patient's response, side effects, and tolerance of medication regimen.  Therapeutic Interventions: 1 to 1 sessions, Unit Group sessions and Medication administration.  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  Therapeutic Interventions: 1 to 1 sessions, Unit Group sessions and Medication administration.  Evaluation of Outcomes: Progressing   RN Treatment Plan for Primary Diagnosis: Acute stress disorder Long Term Goal(s): Knowledge of disease and therapeutic regimen to maintain health will improve  Short Term Goals: Ability to remain free from injury will improve, Ability to verbalize frustration and anger appropriately will improve, Ability to identify and develop effective coping behaviors will improve, and Compliance with  prescribed medications will improve  Medication Management: RN will administer medications as ordered by provider, will assess and evaluate patient's response and provide education to patient for prescribed medication. RN will report any adverse and/or side effects to prescribing provider.  Therapeutic Interventions: 1 on 1 counseling sessions, Psychoeducation, Medication administration, Evaluate responses to treatment, Monitor vital signs and CBGs as ordered, Perform/monitor CIWA, COWS, AIMS and Fall Risk screenings as ordered, Perform wound care treatments as ordered.  Evaluation of Outcomes: Progressing   LCSW Treatment Plan for Primary Diagnosis: Acute stress disorder Long Term Goal(s): Safe transition to appropriate next level of care at discharge, Engage patient in therapeutic group addressing interpersonal concerns.  Short Term Goals: Engage patient in aftercare planning with referrals  and resources, Increase social support, Increase ability to appropriately verbalize feelings, and Increase skills for wellness and recovery  Therapeutic Interventions: Assess for all discharge needs, 1 to 1 time with Social worker, Explore available resources and support systems, Assess for adequacy in community support network, Educate family and significant other(s) on suicide prevention, Complete Psychosocial Assessment, Interpersonal group therapy.  Evaluation of Outcomes: Progressing   Progress in Treatment: Attending groups: Yes. Participating in groups: Yes. Taking medication as prescribed: Yes. Toleration medication: Yes. Family/Significant other contact made: No, will contact:  declined consents Patient understands diagnosis: Yes. Discussing patient identified problems/goals with staff: Yes. Medical problems stabilized or resolved: Yes. Denies suicidal/homicidal ideation: Yes. Issues/concerns per patient self-inventory: No.   New problem(s) identified: No, Describe:  none  New Short  Term/Long Term Goal(s): medication stabilization, elimination of SI thoughts, development of comprehensive mental wellness plan.    Patient Goals:  Did not attend  Discharge Plan or Barriers: Pt is to return home and is to follow up with established providers  Reason for Continuation of Hospitalization: Medication stabilization  Estimated Length of Stay: 1-3 days   Scribe for Treatment Team: Otelia Santee, LCSW 06/12/2021 11:09 AM

## 2021-06-12 NOTE — Group Note (Deleted)
Date:  06/12/2021 °Time:  10:58 AM ° °Group Topic/Focus:  °Goals Group:   The focus of this group is to help patients establish daily goals to achieve during treatment and discuss how the patient can incorporate goal setting into their daily lives to aide in recovery. °Orientation:   The focus of this group is to educate the patient on the purpose and policies of crisis stabilization and provide a format to answer questions about their admission.  The group details unit policies and expectations of patients while admitted. ° ° ° ° °Participation Level:  {[facility] PARTICIPATION LEVEL:22264} ° °Participation Quality:  {[facility] PARTICIPATION QUALITY:22265} ° °Affect:  {[facility] AFFECT:22266} ° °Cognitive:  {[facility] COGNITIVE:22267} ° °Insight: {[facility] Insight2:20797} ° °Engagement in Group:  {[facility] ENGAGEMENT IN GROUP:22268} ° °Modes of Intervention:  {[facility] MODES OF INTERVENTION:22269} ° °Additional Comments:  *** ° °Luca Burston Lashawn Elke Holtry °06/12/2021, 10:58 AM ° °

## 2021-09-25 ENCOUNTER — Ambulatory Visit: Payer: Medicare HMO | Admitting: Family

## 2021-10-17 ENCOUNTER — Other Ambulatory Visit: Payer: Self-pay

## 2021-10-17 ENCOUNTER — Other Ambulatory Visit: Payer: Medicare HMO

## 2021-10-17 DIAGNOSIS — Z113 Encounter for screening for infections with a predominantly sexual mode of transmission: Secondary | ICD-10-CM

## 2021-10-17 DIAGNOSIS — B20 Human immunodeficiency virus [HIV] disease: Secondary | ICD-10-CM

## 2021-10-17 DIAGNOSIS — Z79899 Other long term (current) drug therapy: Secondary | ICD-10-CM

## 2021-10-31 ENCOUNTER — Other Ambulatory Visit: Payer: Self-pay

## 2021-10-31 ENCOUNTER — Telehealth: Payer: Medicare HMO | Admitting: Family

## 2021-11-16 ENCOUNTER — Other Ambulatory Visit: Payer: Self-pay | Admitting: Family
